# Patient Record
Sex: Female | Born: 1960 | Race: White | Hispanic: No | Marital: Single | Smoking: Never smoker
Health system: Southern US, Community
[De-identification: ages and names within clinical notes are randomized; demographics above are authoritative.]

## PROBLEM LIST (undated history)

## (undated) DIAGNOSIS — R609 Edema, unspecified: Secondary | ICD-10-CM

## (undated) DIAGNOSIS — E785 Hyperlipidemia, unspecified: Secondary | ICD-10-CM

## (undated) DIAGNOSIS — T8859XA Other complications of anesthesia, initial encounter: Secondary | ICD-10-CM

## (undated) DIAGNOSIS — E119 Type 2 diabetes mellitus without complications: Secondary | ICD-10-CM

## (undated) DIAGNOSIS — F419 Anxiety disorder, unspecified: Secondary | ICD-10-CM

## (undated) DIAGNOSIS — E039 Hypothyroidism, unspecified: Secondary | ICD-10-CM

## (undated) DIAGNOSIS — T4145XA Adverse effect of unspecified anesthetic, initial encounter: Secondary | ICD-10-CM

## (undated) DIAGNOSIS — R232 Flushing: Secondary | ICD-10-CM

## (undated) DIAGNOSIS — I8390 Asymptomatic varicose veins of unspecified lower extremity: Secondary | ICD-10-CM

## (undated) DIAGNOSIS — E079 Disorder of thyroid, unspecified: Secondary | ICD-10-CM

## (undated) DIAGNOSIS — F32A Depression, unspecified: Secondary | ICD-10-CM

## (undated) DIAGNOSIS — M1990 Unspecified osteoarthritis, unspecified site: Secondary | ICD-10-CM

## (undated) DIAGNOSIS — C50919 Malignant neoplasm of unspecified site of unspecified female breast: Secondary | ICD-10-CM

## (undated) DIAGNOSIS — F329 Major depressive disorder, single episode, unspecified: Secondary | ICD-10-CM

## (undated) HISTORY — PX: CARPAL TUNNEL RELEASE: SHX101

## (undated) HISTORY — DX: Disorder of thyroid, unspecified: E07.9

## (undated) HISTORY — PX: VEIN SURGERY: SHX48

## (undated) HISTORY — DX: Type 2 diabetes mellitus without complications: E11.9

## (undated) HISTORY — DX: Hypothyroidism, unspecified: E03.9

## (undated) HISTORY — DX: Flushing: R23.2

## (undated) HISTORY — DX: Asymptomatic varicose veins of unspecified lower extremity: I83.90

## (undated) HISTORY — DX: Malignant neoplasm of unspecified site of unspecified female breast: C50.919

## (undated) HISTORY — PX: AXILLARY NODE DISSECTION: SHX1211

---

## 1989-01-08 HISTORY — PX: BREAST REDUCTION SURGERY: SHX8

## 1998-04-06 HISTORY — PX: CARPAL TUNNEL RELEASE: SHX101

## 1999-01-09 HISTORY — PX: ABDOMINAL HYSTERECTOMY: SHX81

## 2000-08-05 ENCOUNTER — Ambulatory Visit (HOSPITAL_COMMUNITY): Admission: RE | Admit: 2000-08-05 | Discharge: 2000-08-05 | Payer: Self-pay | Admitting: Family Medicine

## 2000-08-05 ENCOUNTER — Encounter: Payer: Self-pay | Admitting: Family Medicine

## 2001-01-08 HISTORY — PX: DEBRIDEMENT TENNIS ELBOW: SHX1442

## 2001-04-29 ENCOUNTER — Encounter: Admission: RE | Admit: 2001-04-29 | Discharge: 2001-07-28 | Payer: Self-pay | Admitting: Internal Medicine

## 2001-07-15 ENCOUNTER — Encounter (HOSPITAL_COMMUNITY): Admission: RE | Admit: 2001-07-15 | Discharge: 2001-08-15 | Payer: Self-pay | Admitting: Orthopaedic Surgery

## 2001-08-21 ENCOUNTER — Encounter: Admission: RE | Admit: 2001-08-21 | Discharge: 2001-10-21 | Payer: Self-pay | Admitting: Internal Medicine

## 2002-07-23 ENCOUNTER — Ambulatory Visit (HOSPITAL_COMMUNITY): Admission: RE | Admit: 2002-07-23 | Discharge: 2002-07-23 | Payer: Self-pay | Admitting: Obstetrics & Gynecology

## 2002-07-23 ENCOUNTER — Encounter: Payer: Self-pay | Admitting: Obstetrics & Gynecology

## 2003-02-22 ENCOUNTER — Ambulatory Visit (HOSPITAL_COMMUNITY): Admission: RE | Admit: 2003-02-22 | Discharge: 2003-02-22 | Payer: Self-pay | Admitting: Family Medicine

## 2003-07-26 ENCOUNTER — Ambulatory Visit (HOSPITAL_COMMUNITY): Admission: RE | Admit: 2003-07-26 | Discharge: 2003-07-26 | Payer: Self-pay | Admitting: Obstetrics & Gynecology

## 2003-10-23 ENCOUNTER — Ambulatory Visit (HOSPITAL_BASED_OUTPATIENT_CLINIC_OR_DEPARTMENT_OTHER): Admission: RE | Admit: 2003-10-23 | Discharge: 2003-10-23 | Payer: Self-pay | Admitting: Internal Medicine

## 2003-12-08 ENCOUNTER — Emergency Department (HOSPITAL_COMMUNITY): Admission: EM | Admit: 2003-12-08 | Discharge: 2003-12-08 | Payer: Self-pay | Admitting: Emergency Medicine

## 2004-08-01 ENCOUNTER — Ambulatory Visit (HOSPITAL_COMMUNITY): Admission: RE | Admit: 2004-08-01 | Discharge: 2004-08-01 | Payer: Self-pay | Admitting: Obstetrics & Gynecology

## 2005-08-03 ENCOUNTER — Ambulatory Visit (HOSPITAL_COMMUNITY): Admission: RE | Admit: 2005-08-03 | Discharge: 2005-08-03 | Payer: Self-pay | Admitting: Obstetrics & Gynecology

## 2006-02-15 ENCOUNTER — Emergency Department (HOSPITAL_COMMUNITY): Admission: EM | Admit: 2006-02-15 | Discharge: 2006-02-15 | Payer: Self-pay | Admitting: Emergency Medicine

## 2006-08-05 ENCOUNTER — Ambulatory Visit (HOSPITAL_COMMUNITY): Admission: RE | Admit: 2006-08-05 | Discharge: 2006-08-05 | Payer: Self-pay | Admitting: Obstetrics & Gynecology

## 2006-11-12 ENCOUNTER — Encounter (HOSPITAL_COMMUNITY): Admission: RE | Admit: 2006-11-12 | Discharge: 2006-12-12 | Payer: Self-pay | Admitting: Orthopaedic Surgery

## 2007-01-15 HISTORY — PX: CERVICAL FUSION: SHX112

## 2007-08-07 ENCOUNTER — Ambulatory Visit (HOSPITAL_COMMUNITY): Admission: RE | Admit: 2007-08-07 | Discharge: 2007-08-07 | Payer: Self-pay | Admitting: Family Medicine

## 2008-08-09 ENCOUNTER — Ambulatory Visit (HOSPITAL_COMMUNITY): Admission: RE | Admit: 2008-08-09 | Discharge: 2008-08-09 | Payer: Self-pay | Admitting: Obstetrics and Gynecology

## 2008-08-13 ENCOUNTER — Encounter: Admission: RE | Admit: 2008-08-13 | Discharge: 2008-08-13 | Payer: Self-pay | Admitting: Internal Medicine

## 2008-11-12 ENCOUNTER — Encounter: Admission: RE | Admit: 2008-11-12 | Discharge: 2008-11-12 | Payer: Self-pay | Admitting: Internal Medicine

## 2009-01-08 DIAGNOSIS — C50919 Malignant neoplasm of unspecified site of unspecified female breast: Secondary | ICD-10-CM

## 2009-01-08 HISTORY — DX: Malignant neoplasm of unspecified site of unspecified female breast: C50.919

## 2009-01-25 ENCOUNTER — Ambulatory Visit (HOSPITAL_BASED_OUTPATIENT_CLINIC_OR_DEPARTMENT_OTHER): Admission: RE | Admit: 2009-01-25 | Discharge: 2009-01-25 | Payer: Self-pay | Admitting: General Surgery

## 2009-01-25 HISTORY — PX: OTHER SURGICAL HISTORY: SHX169

## 2009-02-10 ENCOUNTER — Encounter: Admission: RE | Admit: 2009-02-10 | Discharge: 2009-02-10 | Payer: Self-pay | Admitting: General Surgery

## 2009-02-14 ENCOUNTER — Encounter: Admission: RE | Admit: 2009-02-14 | Discharge: 2009-02-14 | Payer: Self-pay | Admitting: General Surgery

## 2009-02-18 ENCOUNTER — Encounter: Admission: RE | Admit: 2009-02-18 | Discharge: 2009-02-18 | Payer: Self-pay | Admitting: General Surgery

## 2009-02-23 ENCOUNTER — Ambulatory Visit (HOSPITAL_BASED_OUTPATIENT_CLINIC_OR_DEPARTMENT_OTHER): Admission: RE | Admit: 2009-02-23 | Discharge: 2009-02-24 | Payer: Self-pay | Admitting: General Surgery

## 2009-03-15 ENCOUNTER — Encounter: Admission: RE | Admit: 2009-03-15 | Discharge: 2009-03-15 | Payer: Self-pay | Admitting: General Surgery

## 2009-03-16 ENCOUNTER — Ambulatory Visit (HOSPITAL_COMMUNITY): Admission: RE | Admit: 2009-03-16 | Discharge: 2009-03-16 | Payer: Self-pay | Admitting: General Surgery

## 2009-04-05 ENCOUNTER — Encounter (HOSPITAL_COMMUNITY): Admission: RE | Admit: 2009-04-05 | Discharge: 2009-05-05 | Payer: Self-pay | Admitting: Oncology

## 2009-04-05 ENCOUNTER — Ambulatory Visit (HOSPITAL_COMMUNITY): Payer: Self-pay | Admitting: Oncology

## 2009-04-06 ENCOUNTER — Encounter (HOSPITAL_COMMUNITY): Payer: Self-pay | Admitting: Oncology

## 2009-04-06 ENCOUNTER — Ambulatory Visit: Payer: Self-pay | Admitting: Cardiology

## 2009-04-08 ENCOUNTER — Encounter: Admission: RE | Admit: 2009-04-08 | Discharge: 2009-04-08 | Payer: Self-pay | Admitting: Orthopaedic Surgery

## 2009-04-19 ENCOUNTER — Observation Stay (HOSPITAL_COMMUNITY): Admission: RE | Admit: 2009-04-19 | Discharge: 2009-04-20 | Payer: Self-pay | Admitting: Obstetrics and Gynecology

## 2009-04-19 ENCOUNTER — Encounter: Payer: Self-pay | Admitting: Obstetrics and Gynecology

## 2009-04-19 HISTORY — PX: BILATERAL OOPHORECTOMY: SHX1221

## 2009-05-02 ENCOUNTER — Ambulatory Visit (HOSPITAL_BASED_OUTPATIENT_CLINIC_OR_DEPARTMENT_OTHER): Admission: RE | Admit: 2009-05-02 | Discharge: 2009-05-02 | Payer: Self-pay | Admitting: General Surgery

## 2009-05-02 HISTORY — PX: PORTACATH PLACEMENT: SHX2246

## 2009-05-10 ENCOUNTER — Encounter (HOSPITAL_COMMUNITY): Admission: RE | Admit: 2009-05-10 | Discharge: 2009-06-09 | Payer: Self-pay | Admitting: Oncology

## 2009-05-24 ENCOUNTER — Ambulatory Visit (HOSPITAL_COMMUNITY): Payer: Self-pay | Admitting: Oncology

## 2009-06-09 ENCOUNTER — Encounter (HOSPITAL_COMMUNITY): Admission: RE | Admit: 2009-06-09 | Discharge: 2009-07-09 | Payer: Self-pay | Admitting: Oncology

## 2009-07-13 ENCOUNTER — Ambulatory Visit (HOSPITAL_COMMUNITY): Payer: Self-pay | Admitting: Oncology

## 2009-07-20 ENCOUNTER — Encounter (HOSPITAL_COMMUNITY): Admission: RE | Admit: 2009-07-20 | Discharge: 2009-08-19 | Payer: Self-pay | Admitting: Oncology

## 2009-07-22 ENCOUNTER — Encounter: Admission: RE | Admit: 2009-07-22 | Discharge: 2009-07-22 | Payer: Self-pay | Admitting: Internal Medicine

## 2009-07-25 ENCOUNTER — Ambulatory Visit (HOSPITAL_COMMUNITY): Admission: RE | Admit: 2009-07-25 | Discharge: 2009-07-25 | Payer: Self-pay | Admitting: Urology

## 2009-08-23 ENCOUNTER — Encounter (HOSPITAL_COMMUNITY): Admission: RE | Admit: 2009-08-23 | Discharge: 2009-09-22 | Payer: Self-pay | Admitting: Oncology

## 2009-08-31 ENCOUNTER — Ambulatory Visit (HOSPITAL_COMMUNITY): Payer: Self-pay | Admitting: Oncology

## 2009-08-31 ENCOUNTER — Ambulatory Visit (HOSPITAL_COMMUNITY): Admission: RE | Admit: 2009-08-31 | Discharge: 2009-08-31 | Payer: Self-pay | Admitting: Oncology

## 2009-10-04 ENCOUNTER — Ambulatory Visit
Admission: RE | Admit: 2009-10-04 | Discharge: 2009-12-26 | Payer: Self-pay | Source: Home / Self Care | Attending: Radiation Oncology | Admitting: Radiation Oncology

## 2009-10-05 ENCOUNTER — Encounter (HOSPITAL_COMMUNITY)
Admission: RE | Admit: 2009-10-05 | Discharge: 2009-11-04 | Payer: Self-pay | Source: Home / Self Care | Admitting: Oncology

## 2009-11-21 ENCOUNTER — Encounter (HOSPITAL_COMMUNITY)
Admission: RE | Admit: 2009-11-21 | Discharge: 2009-12-21 | Payer: Self-pay | Source: Home / Self Care | Attending: Oncology | Admitting: Oncology

## 2009-11-21 ENCOUNTER — Ambulatory Visit (HOSPITAL_COMMUNITY): Payer: Self-pay | Admitting: Oncology

## 2010-01-08 HISTORY — PX: RECTOCELE REPAIR: SHX761

## 2010-01-30 ENCOUNTER — Encounter: Payer: Self-pay | Admitting: Obstetrics and Gynecology

## 2010-02-01 HISTORY — PX: OTHER SURGICAL HISTORY: SHX169

## 2010-02-09 ENCOUNTER — Encounter (HOSPITAL_COMMUNITY): Admission: RE | Admit: 2010-02-09 | Payer: Self-pay | Source: Home / Self Care | Admitting: Oncology

## 2010-02-09 ENCOUNTER — Other Ambulatory Visit (HOSPITAL_COMMUNITY): Payer: BC Managed Care – PPO

## 2010-02-09 ENCOUNTER — Ambulatory Visit (HOSPITAL_COMMUNITY): Admission: RE | Admit: 2010-02-09 | Payer: Self-pay | Source: Ambulatory Visit | Admitting: Oncology

## 2010-02-09 DIAGNOSIS — Z452 Encounter for adjustment and management of vascular access device: Secondary | ICD-10-CM

## 2010-02-09 DIAGNOSIS — C50919 Malignant neoplasm of unspecified site of unspecified female breast: Secondary | ICD-10-CM

## 2010-02-15 ENCOUNTER — Other Ambulatory Visit: Payer: Self-pay | Admitting: Adult Health

## 2010-02-15 DIAGNOSIS — Z01419 Encounter for gynecological examination (general) (routine) without abnormal findings: Secondary | ICD-10-CM | POA: Insufficient documentation

## 2010-02-20 ENCOUNTER — Ambulatory Visit (HOSPITAL_COMMUNITY): Payer: BC Managed Care – PPO | Admitting: Oncology

## 2010-02-20 DIAGNOSIS — C50919 Malignant neoplasm of unspecified site of unspecified female breast: Secondary | ICD-10-CM

## 2010-02-21 ENCOUNTER — Other Ambulatory Visit (HOSPITAL_COMMUNITY): Payer: BC Managed Care – PPO

## 2010-02-21 ENCOUNTER — Encounter (HOSPITAL_COMMUNITY): Payer: BC Managed Care – PPO | Attending: Oncology

## 2010-02-21 DIAGNOSIS — E119 Type 2 diabetes mellitus without complications: Secondary | ICD-10-CM | POA: Insufficient documentation

## 2010-02-21 DIAGNOSIS — C50919 Malignant neoplasm of unspecified site of unspecified female breast: Secondary | ICD-10-CM | POA: Insufficient documentation

## 2010-02-21 DIAGNOSIS — E039 Hypothyroidism, unspecified: Secondary | ICD-10-CM | POA: Insufficient documentation

## 2010-02-21 DIAGNOSIS — Z79899 Other long term (current) drug therapy: Secondary | ICD-10-CM | POA: Insufficient documentation

## 2010-03-01 ENCOUNTER — Other Ambulatory Visit (HOSPITAL_COMMUNITY)
Admission: RE | Admit: 2010-03-01 | Discharge: 2010-03-01 | Disposition: A | Discharge status: Home / Self Care | Payer: BC Managed Care – PPO | Source: Ambulatory Visit | Attending: Obstetrics and Gynecology | Admitting: Obstetrics and Gynecology

## 2010-03-03 ENCOUNTER — Other Ambulatory Visit: Payer: Self-pay | Admitting: Obstetrics and Gynecology

## 2010-03-03 ENCOUNTER — Encounter (HOSPITAL_COMMUNITY): Payer: BC Managed Care – PPO

## 2010-03-03 DIAGNOSIS — Z01812 Encounter for preprocedural laboratory examination: Secondary | ICD-10-CM | POA: Insufficient documentation

## 2010-03-07 ENCOUNTER — Ambulatory Visit (HOSPITAL_COMMUNITY)
Admission: RE | Admit: 2010-03-07 | Discharge: 2010-03-08 | Disposition: A | Discharge status: Home / Self Care | Payer: BC Managed Care – PPO | Source: Ambulatory Visit | Attending: Obstetrics and Gynecology | Admitting: Obstetrics and Gynecology

## 2010-03-07 DIAGNOSIS — Z01818 Encounter for other preprocedural examination: Secondary | ICD-10-CM | POA: Insufficient documentation

## 2010-03-07 DIAGNOSIS — Z853 Personal history of malignant neoplasm of breast: Secondary | ICD-10-CM | POA: Insufficient documentation

## 2010-03-07 DIAGNOSIS — Z79899 Other long term (current) drug therapy: Secondary | ICD-10-CM | POA: Insufficient documentation

## 2010-03-07 DIAGNOSIS — N815 Vaginal enterocele: Secondary | ICD-10-CM | POA: Insufficient documentation

## 2010-03-07 DIAGNOSIS — E039 Hypothyroidism, unspecified: Secondary | ICD-10-CM | POA: Insufficient documentation

## 2010-03-07 DIAGNOSIS — N816 Rectocele: Secondary | ICD-10-CM | POA: Insufficient documentation

## 2010-03-07 DIAGNOSIS — E119 Type 2 diabetes mellitus without complications: Secondary | ICD-10-CM | POA: Insufficient documentation

## 2010-03-07 DIAGNOSIS — Z01812 Encounter for preprocedural laboratory examination: Secondary | ICD-10-CM | POA: Insufficient documentation

## 2010-03-07 HISTORY — PX: OTHER SURGICAL HISTORY: SHX169

## 2010-03-07 LAB — GLUCOSE, CAPILLARY

## 2010-03-08 LAB — DIFFERENTIAL
Eosinophils Relative: 4 % (ref 0–5)
Lymphocytes Relative: 18 % (ref 12–46)
Monocytes Absolute: 0.6 10*3/uL (ref 0.1–1.0)
Monocytes Relative: 7 % (ref 3–12)
Neutro Abs: 5.8 10*3/uL (ref 1.7–7.7)

## 2010-03-08 LAB — CBC
HCT: 35.7 % — ABNORMAL LOW (ref 36.0–46.0)
Hemoglobin: 11.5 g/dL — ABNORMAL LOW (ref 12.0–15.0)
MCH: 26.3 pg (ref 26.0–34.0)
MCHC: 32.2 g/dL (ref 30.0–36.0)
MCV: 81.5 fL (ref 78.0–100.0)
RDW: 14.4 % (ref 11.5–15.5)

## 2010-03-09 NOTE — Op Note (Signed)
NAME:  Kelly Jacobson, Kelly Jacobson NO.:  [id number]  MEDICAL RECORD NO.:  [id number]           PATIENT TYPE:  O  LOCATION:  A207                          FACILITY:  [facility]  PHYSICIAN:  Tilda Burrow, M.D. DATE OF BIRTH:  1960-05-29  DATE OF PROCEDURE:  03/07/2010 DATE OF DISCHARGE:                              OPERATIVE REPORT   PREOPERATIVE DIAGNOSIS:  Rectocele.  POSTOPERATIVE DIAGNOSES:             1. Rectocele.          2. Enterocele.  PROCEDURE:  Rectocele and enterocele repair.  SURGEON:  Tilda Burrow, MD ASSISTANTMarlinda Mike, RN ANESTHESIA:  General with LMA.  COMPLICATIONS:  None.  FINDINGS:  Site specific defect with a disruption of the pelvic support at the vaginal apex which responded dramatically to identifying lateral support and the inferior edges of the defect.  DETAILS OF PROCEDURE:  The patient was taken to the operating room and prepped and draped for vaginal procedure with legs in candy-canesupport.  Time-out was conducted and procedure confirmed by all.  IV antibiotics Ancef 1 g was administered.  The posterior vaginal mucosa was infiltrated with Marcaine solution with epinephrine x20 mL and then the perineal body opened in the midline at the site of some fibrosis at the introitus from prior episiotomy.  Above the fibrosis was a large bulging sac which upon dissection appeared to be both enterocele and rectocele.  There was a transverse layer of firm tissue in the lower half of the defect and a very large and very thin membranous defect above it.  This was determined to be an enterocele but doing a digital rectal exam confirming that the sac was in front of the rectal wall laxity.  It was noted that by grasping the transverse thicker tissue that it was located approximately halfway up the defect,  We were able to lift the stronger supportive tissue cephalad and by doing so obliterated the enterocele.  The lateral tissue support could  be identified on each side and this was grasped with Allis clamps, pulled together in the midline, and sutured together, incorporating the posterior wall of supportive tissue and pulling it cephalad.  This resulted in dramatic improvement in the posterior perineal body tissue support.  A series of vertical mattress sutures of 0 Vicryl were placed inferior to this initial stitch and resulted in a good layer of thickened tissue in the perineal body.  The result was a thickened perineal body.  The introitus was recontoured to reduce introitus size to approximately 2 fingerbreadths wide.  This was also pulled together using interrupted 0 Vicryl sutures.  The vaginal mucosa required only a small bit of trimming but was then pulled together in the midline with interrupted 2-0 chromic.  The vaginal packing was placed using Betadine-soaked gauze and Foley catheter was inserted, obtaining clear urine.  Sponge and needle counts were correct.  The patient went to recovery room in good condition, will be observed from 4- 6 hours and then discharged home.     Tilda Burrow, M.D.     JVF/MEDQ  D:  03/07/2010  T:  03/08/2010  Job:  [id number]  Electronically Signed by Christin Bach M.D. on 03/09/2010 08:45:48 PM

## 2010-03-23 ENCOUNTER — Other Ambulatory Visit (HOSPITAL_COMMUNITY): Payer: BC Managed Care – PPO

## 2010-03-23 ENCOUNTER — Encounter (HOSPITAL_COMMUNITY): Payer: BC Managed Care – PPO | Attending: Oncology

## 2010-03-23 DIAGNOSIS — C50919 Malignant neoplasm of unspecified site of unspecified female breast: Secondary | ICD-10-CM

## 2010-03-23 LAB — COMPREHENSIVE METABOLIC PANEL
ALT: 83 U/L — ABNORMAL HIGH (ref 0–35)
Alkaline Phosphatase: 47 U/L (ref 39–117)
BUN: 10 mg/dL (ref 6–23)
BUN: 10 mg/dL (ref 6–23)
CO2: 28 mEq/L (ref 19–32)
CO2: 34 mEq/L — ABNORMAL HIGH (ref 19–32)
Calcium: 9.9 mg/dL (ref 8.4–10.5)
Chloride: 102 mEq/L (ref 96–112)
GFR calc non Af Amer: >60 mL/min (ref >60)
Glucose, Bld: 159 mg/dL — ABNORMAL HIGH (ref 70–99)
Glucose, Bld: 272 mg/dL — ABNORMAL HIGH (ref 70–99)
Potassium: 3.5 mEq/L (ref 3.5–5.1)
Sodium: 138 mEq/L (ref 135–145)
Total Bilirubin: 0.6 mg/dL (ref 0.3–1.2)

## 2010-03-23 LAB — CBC
HCT: 29.6 % — ABNORMAL LOW (ref 36.0–46.0)
HCT: 33.4 % — ABNORMAL LOW (ref 36.0–46.0)
Hemoglobin: 10.9 g/dL — ABNORMAL LOW (ref 12.0–15.0)
Hemoglobin: 11.3 g/dL — ABNORMAL LOW (ref 12.0–15.0)
Hemoglobin: 11.4 g/dL — ABNORMAL LOW (ref 12.0–15.0)
Hemoglobin: 12.1 g/dL (ref 12.0–15.0)
MCH: 31 pg (ref 26.0–34.0)
MCH: 31.4 pg (ref 26.0–34.0)
MCHC: 33.9 g/dL (ref 30.0–36.0)
MCHC: 34.1 g/dL (ref 30.0–36.0)
MCV: 91.1 fL (ref 78.0–100.0)
MCV: 92.1 fL (ref 78.0–100.0)
MCV: 92.6 fL (ref 78.0–100.0)
Platelets: 290 10*3/uL (ref 150–400)
RBC: 3.25 MIL/uL — ABNORMAL LOW (ref 3.87–5.11)
RBC: 3.49 MIL/uL — ABNORMAL LOW (ref 3.87–5.11)
RBC: 3.64 MIL/uL — ABNORMAL LOW (ref 3.87–5.11)
RBC: 3.88 MIL/uL (ref 3.87–5.11)
WBC: 5.5 10*3/uL (ref 4.0–10.5)
WBC: 7 10*3/uL (ref 4.0–10.5)
WBC: 7.7 10*3/uL (ref 4.0–10.5)

## 2010-03-23 LAB — DIFFERENTIAL
Basophils Absolute: 0 10*3/uL (ref 0.0–0.1)
Basophils Absolute: 0 10*3/uL (ref 0.0–0.1)
Basophils Relative: 0 % (ref 0–1)
Basophils Relative: 0 % (ref 0–1)
Basophils Relative: 1 % (ref 0–1)
Eosinophils Absolute: 0.2 10*3/uL (ref 0.0–0.7)
Lymphocytes Relative: 18 % (ref 12–46)
Lymphocytes Relative: 22 % (ref 12–46)
Lymphs Abs: 1 10*3/uL (ref 0.7–4.0)
Lymphs Abs: 1.4 10*3/uL (ref 0.7–4.0)
Lymphs Abs: 1.5 10*3/uL (ref 0.7–4.0)
Monocytes Absolute: 0.2 10*3/uL (ref 0.1–1.0)
Monocytes Relative: 5 % (ref 3–12)
Monocytes Relative: 5 % (ref 3–12)
Neutro Abs: 4.1 10*3/uL (ref 1.7–7.7)
Neutro Abs: 4.6 10*3/uL (ref 1.7–7.7)
Neutro Abs: 5.8 10*3/uL (ref 1.7–7.7)
Neutro Abs: 5.8 10*3/uL (ref 1.7–7.7)
Neutrophils Relative %: 71 % (ref 43–77)
Neutrophils Relative %: 75 % (ref 43–77)
Neutrophils Relative %: 75 % (ref 43–77)
Neutrophils Relative %: 76 % (ref 43–77)
Neutrophils Relative %: 76 % (ref 43–77)

## 2010-03-24 LAB — CBC
HCT: 29.9 % — ABNORMAL LOW (ref 36.0–46.0)
HCT: 34 % — ABNORMAL LOW (ref 36.0–46.0)
Hemoglobin: 10.2 g/dL — ABNORMAL LOW (ref 12.0–15.0)
MCH: 30.1 pg (ref 26.0–34.0)
MCH: 30.1 pg (ref 26.0–34.0)
MCV: 87.9 fL (ref 78.0–100.0)
MCV: 90.4 fL (ref 78.0–100.0)
Platelets: 311 10*3/uL (ref 150–400)
RBC: 3.4 MIL/uL — ABNORMAL LOW (ref 3.87–5.11)
RDW: 20.3 % — ABNORMAL HIGH (ref 11.5–15.5)

## 2010-03-24 LAB — COMPREHENSIVE METABOLIC PANEL
Albumin: 4.2 g/dL (ref 3.5–5.2)
Alkaline Phosphatase: 56 U/L (ref 39–117)
BUN: 8 mg/dL (ref 6–23)
Chloride: 100 mEq/L (ref 96–112)
Creatinine, Ser: 0.72 mg/dL (ref 0.4–1.2)
Glucose, Bld: 237 mg/dL — ABNORMAL HIGH (ref 70–99)
Total Bilirubin: 0.9 mg/dL (ref 0.3–1.2)
Total Protein: 7.1 g/dL (ref 6.0–8.3)

## 2010-03-24 LAB — DIFFERENTIAL
Basophils Absolute: 0 10*3/uL (ref 0.0–0.1)
Basophils Relative: 0 % (ref 0–1)
Eosinophils Absolute: 0.1 10*3/uL (ref 0.0–0.7)
Eosinophils Relative: 3 % (ref 0–5)
Lymphocytes Relative: 17 % (ref 12–46)
Lymphocytes Relative: 18 % (ref 12–46)
Lymphs Abs: 1 10*3/uL (ref 0.7–4.0)
Monocytes Absolute: 0.3 10*3/uL (ref 0.1–1.0)
Monocytes Relative: 3 % (ref 3–12)
Neutro Abs: 4.7 10*3/uL (ref 1.7–7.7)
Neutrophils Relative %: 75 % (ref 43–77)

## 2010-03-25 LAB — DIFFERENTIAL
Basophils Relative: 0 % (ref 0–1)
Eosinophils Absolute: 0.2 10*3/uL (ref 0.0–0.7)
Lymphocytes Relative: 18 % (ref 12–46)
Lymphs Abs: 1.1 10*3/uL (ref 0.7–4.0)
Lymphs Abs: 1.1 10*3/uL (ref 0.7–4.0)
Monocytes Absolute: 0.2 10*3/uL (ref 0.1–1.0)
Monocytes Absolute: 0.3 10*3/uL (ref 0.1–1.0)
Monocytes Relative: 4 % (ref 3–12)
Monocytes Relative: 5 % (ref 3–12)
Neutro Abs: 4.5 10*3/uL (ref 1.7–7.7)
Neutrophils Relative %: 69 % (ref 43–77)
Neutrophils Relative %: 75 % (ref 43–77)

## 2010-03-25 LAB — CBC
HCT: 29.9 % — ABNORMAL LOW (ref 36.0–46.0)
HCT: 32.5 % — ABNORMAL LOW (ref 36.0–46.0)
Hemoglobin: 10.3 g/dL — ABNORMAL LOW (ref 12.0–15.0)
Hemoglobin: 11 g/dL — ABNORMAL LOW (ref 12.0–15.0)
MCH: 30 pg (ref 26.0–34.0)
MCHC: 33.8 g/dL (ref 30.0–36.0)
MCHC: 34.3 g/dL (ref 30.0–36.0)
MCV: 87.4 fL (ref 78.0–100.0)
RBC: 3.42 MIL/uL — ABNORMAL LOW (ref 3.87–5.11)
RBC: 3.76 MIL/uL — ABNORMAL LOW (ref 3.87–5.11)

## 2010-03-25 LAB — TSH: TSH: 3.325 u[IU]/mL (ref 0.350–4.500)

## 2010-03-26 LAB — COMPREHENSIVE METABOLIC PANEL
ALT: 46 U/L — ABNORMAL HIGH (ref 0–35)
Alkaline Phosphatase: 48 U/L (ref 39–117)
BUN: 8 mg/dL (ref 6–23)
CO2: 30 mEq/L (ref 19–32)
CO2: 32 mEq/L (ref 19–32)
Calcium: 9.3 mg/dL (ref 8.4–10.5)
Calcium: 8.6 mg/dL (ref 8.4–10.5)
Creatinine, Ser: 0.57 mg/dL (ref 0.4–1.2)
GFR calc non Af Amer: >60 mL/min (ref >60)
GFR calc non Af Amer: >60 mL/min (ref >60)
Glucose, Bld: 222 mg/dL — ABNORMAL HIGH (ref 70–99)
Glucose, Bld: 177 mg/dL — ABNORMAL HIGH (ref 70–99)
Sodium: 139 mEq/L (ref 135–145)
Total Protein: 6.3 g/dL (ref 6.0–8.3)

## 2010-03-26 LAB — CBC
HCT: 30 % — ABNORMAL LOW (ref 36.0–46.0)
HCT: 30.6 % — ABNORMAL LOW (ref 36.0–46.0)
HCT: 29 % — ABNORMAL LOW (ref 36.0–46.0)
HCT: 30.4 % — ABNORMAL LOW (ref 36.0–46.0)
Hemoglobin: 10.1 g/dL — ABNORMAL LOW (ref 12.0–15.0)
Hemoglobin: 10.4 g/dL — ABNORMAL LOW (ref 12.0–15.0)
Hemoglobin: 9.7 g/dL — ABNORMAL LOW (ref 12.0–15.0)
MCH: 29 pg (ref 26.0–34.0)
MCHC: 33.5 g/dL (ref 30.0–36.0)
MCHC: 34 g/dL (ref 30.0–36.0)
MCV: 85.3 fL (ref 78.0–100.0)
MCV: 84.4 fL (ref 78.0–100.0)
Platelets: 202 10*3/uL (ref 150–400)
RBC: 3.43 MIL/uL — ABNORMAL LOW (ref 3.87–5.11)
RDW: 18.2 % — ABNORMAL HIGH (ref 11.5–15.5)
RDW: 20 % — ABNORMAL HIGH (ref 11.5–15.5)
RDW: 17.4 % — ABNORMAL HIGH (ref 11.5–15.5)
WBC: 6.1 10*3/uL (ref 4.0–10.5)
WBC: 9.7 10*3/uL (ref 4.0–10.5)

## 2010-03-26 LAB — DIFFERENTIAL
Basophils Absolute: 0 10*3/uL (ref 0.0–0.1)
Basophils Relative: 0 % (ref 0–1)
Eosinophils Absolute: 0.2 10*3/uL (ref 0.0–0.7)
Eosinophils Relative: 3 % (ref 0–5)
Lymphocytes Relative: 14 % (ref 12–46)
Lymphocytes Relative: 17 % (ref 12–46)
Lymphocytes Relative: 19 % (ref 12–46)
Lymphs Abs: 1.1 10*3/uL (ref 0.7–4.0)
Lymphs Abs: 1.4 10*3/uL (ref 0.7–4.0)
Monocytes Absolute: 0.6 10*3/uL (ref 0.1–1.0)
Monocytes Absolute: 0.7 10*3/uL (ref 0.1–1.0)
Monocytes Relative: 5 % (ref 3–12)
Monocytes Relative: 7 % (ref 3–12)
Monocytes Relative: 7 % (ref 3–12)
Neutro Abs: 4.9 10*3/uL (ref 1.7–7.7)
Neutro Abs: 6.4 10*3/uL (ref 1.7–7.7)
Neutro Abs: 4.1 10*3/uL (ref 1.7–7.7)
Neutrophils Relative %: 75 % (ref 43–77)
Neutrophils Relative %: 76 % (ref 43–77)

## 2010-03-27 LAB — DIFFERENTIAL
Basophils Absolute: 0 10*3/uL (ref 0.0–0.1)
Basophils Absolute: 0 10*3/uL (ref 0.0–0.1)
Basophils Relative: 0 % (ref 0–1)
Lymphocytes Relative: 18 % (ref 12–46)
Monocytes Absolute: 0.8 10*3/uL (ref 0.1–1.0)
Monocytes Absolute: 0.5 10*3/uL (ref 0.1–1.0)
Neutro Abs: 8 10*3/uL — ABNORMAL HIGH (ref 1.7–7.7)
Neutro Abs: 8 10*3/uL — ABNORMAL HIGH (ref 1.7–7.7)
Neutrophils Relative %: 76 % (ref 43–77)
Neutrophils Relative %: 74 % (ref 43–77)

## 2010-03-27 LAB — CBC
HCT: 32.9 % — ABNORMAL LOW (ref 36.0–46.0)
Hemoglobin: 10.9 g/dL — ABNORMAL LOW (ref 12.0–15.0)
Hemoglobin: 11.6 g/dL — ABNORMAL LOW (ref 12.0–15.0)
RBC: 4.09 MIL/uL (ref 3.87–5.11)
RDW: 13.9 % (ref 11.5–15.5)
WBC: 10.5 10*3/uL (ref 4.0–10.5)

## 2010-03-27 LAB — COMPREHENSIVE METABOLIC PANEL
Alkaline Phosphatase: 64 U/L (ref 39–117)
BUN: 11 mg/dL (ref 6–23)
Chloride: 99 mEq/L (ref 96–112)
Glucose, Bld: 131 mg/dL — ABNORMAL HIGH (ref 70–99)
Potassium: 3.2 mEq/L — ABNORMAL LOW (ref 3.5–5.1)
Total Bilirubin: 0.9 mg/dL (ref 0.3–1.2)

## 2010-03-28 LAB — DIFFERENTIAL
Basophils Absolute: 0 10*3/uL (ref 0.0–0.1)
Basophils Relative: 0 % (ref 0–1)
Eosinophils Absolute: 0.8 10*3/uL — ABNORMAL HIGH (ref 0.0–0.7)
Neutro Abs: 6.4 10*3/uL (ref 1.7–7.7)
Neutrophils Relative %: 64 % (ref 43–77)

## 2010-03-28 LAB — POCT I-STAT, CHEM 8
Chloride: 101 mEq/L (ref 96–112)
HCT: 38 % (ref 36.0–46.0)
Hemoglobin: 12.9 g/dL (ref 12.0–15.0)
Potassium: 4.3 mEq/L (ref 3.5–5.1)

## 2010-03-28 LAB — COMPREHENSIVE METABOLIC PANEL
ALT: 75 U/L — ABNORMAL HIGH (ref 0–35)
Alkaline Phosphatase: 61 U/L (ref 39–117)
BUN: 12 mg/dL (ref 6–23)
CO2: 29 mEq/L (ref 19–32)
Chloride: 100 mEq/L (ref 96–112)
GFR calc non Af Amer: >60 mL/min (ref >60)
Glucose, Bld: 154 mg/dL — ABNORMAL HIGH (ref 70–99)
Potassium: 3.5 mEq/L (ref 3.5–5.1)
Total Bilirubin: 0.8 mg/dL (ref 0.3–1.2)

## 2010-03-28 LAB — CBC
HCT: 34.6 % — ABNORMAL LOW (ref 36.0–46.0)
Hemoglobin: 12 g/dL (ref 12.0–15.0)
RBC: 4.2 MIL/uL (ref 3.87–5.11)
RDW: 13.8 % (ref 11.5–15.5)
WBC: 10.1 10*3/uL (ref 4.0–10.5)

## 2010-03-28 LAB — GLUCOSE, CAPILLARY: Glucose-Capillary: 165 mg/dL — ABNORMAL HIGH (ref 70–99)

## 2010-03-29 LAB — BASIC METABOLIC PANEL
BUN: 5 mg/dL — ABNORMAL LOW (ref 6–23)
Chloride: 101 mEq/L (ref 96–112)
GFR calc non Af Amer: >60 mL/min (ref >60)
Glucose, Bld: 191 mg/dL — ABNORMAL HIGH (ref 70–99)
Potassium: 4.1 mEq/L (ref 3.5–5.1)
Sodium: 135 mEq/L (ref 135–145)

## 2010-03-29 LAB — CBC
HCT: 36.7 % (ref 36.0–46.0)
Hemoglobin: 12.2 g/dL (ref 12.0–15.0)
Hemoglobin: 12.5 g/dL (ref 12.0–15.0)
MCHC: 34 g/dL (ref 30.0–36.0)
MCV: 83 fL (ref 78.0–100.0)
Platelets: 277 10*3/uL (ref 150–400)
RBC: 4.3 MIL/uL (ref 3.87–5.11)
RDW: 13.9 % (ref 11.5–15.5)
RDW: 14.1 % (ref 11.5–15.5)
WBC: 12.6 10*3/uL — ABNORMAL HIGH (ref 4.0–10.5)

## 2010-03-29 LAB — GLUCOSE, CAPILLARY
Glucose-Capillary: 154 mg/dL — ABNORMAL HIGH (ref 70–99)
Glucose-Capillary: 214 mg/dL — ABNORMAL HIGH (ref 70–99)

## 2010-03-29 LAB — DIFFERENTIAL
Basophils Relative: 0 % (ref 0–1)
Eosinophils Absolute: 0.3 10*3/uL (ref 0.0–0.7)
Eosinophils Absolute: 0.4 10*3/uL (ref 0.0–0.7)
Eosinophils Relative: 3 % (ref 0–5)
Eosinophils Relative: 5 % (ref 0–5)
Lymphocytes Relative: 21 % (ref 12–46)
Lymphs Abs: 1.9 10*3/uL (ref 0.7–4.0)
Lymphs Abs: 2.7 10*3/uL (ref 0.7–4.0)
Monocytes Absolute: 0.6 10*3/uL (ref 0.1–1.0)
Monocytes Relative: 5 % (ref 3–12)
Neutrophils Relative %: 68 % (ref 43–77)

## 2010-03-29 LAB — COMPREHENSIVE METABOLIC PANEL
ALT: 72 U/L — ABNORMAL HIGH (ref 0–35)
AST: 95 U/L — ABNORMAL HIGH (ref 0–37)
Alkaline Phosphatase: 61 U/L (ref 39–117)
CO2: 31 mEq/L (ref 19–32)
Calcium: 9.6 mg/dL (ref 8.4–10.5)
GFR calc Af Amer: >60 mL/min (ref >60)
Glucose, Bld: 165 mg/dL — ABNORMAL HIGH (ref 70–99)
Potassium: 4.3 mEq/L (ref 3.5–5.1)
Sodium: 139 mEq/L (ref 135–145)
Total Protein: 6.6 g/dL (ref 6.0–8.3)

## 2010-03-29 LAB — HEMOGLOBIN A1C: Hgb A1c MFr Bld: 7.4 % — ABNORMAL HIGH (ref <5.7)

## 2010-03-30 LAB — BASIC METABOLIC PANEL
Calcium: 9.4 mg/dL (ref 8.4–10.5)
GFR calc Af Amer: >60 mL/min (ref >60)
GFR calc non Af Amer: >60 mL/min (ref >60)
Potassium: 3.8 mEq/L (ref 3.5–5.1)
Sodium: 139 mEq/L (ref 135–145)

## 2010-03-30 LAB — DIFFERENTIAL
Basophils Absolute: 0 10*3/uL (ref 0.0–0.1)
Eosinophils Relative: 2 % (ref 0–5)
Lymphocytes Relative: 18 % (ref 12–46)
Lymphs Abs: 2 10*3/uL (ref 0.7–4.0)
Monocytes Absolute: 0.4 10*3/uL (ref 0.1–1.0)
Monocytes Relative: 4 % (ref 3–12)
Neutro Abs: 8.5 10*3/uL — ABNORMAL HIGH (ref 1.7–7.7)

## 2010-03-30 LAB — APTT: aPTT: 30 seconds (ref 24–37)

## 2010-03-30 LAB — CBC
HCT: 37 % (ref 36.0–46.0)
Hemoglobin: 12.4 g/dL (ref 12.0–15.0)
RBC: 4.32 MIL/uL (ref 3.87–5.11)
WBC: 11.2 10*3/uL — ABNORMAL HIGH (ref 4.0–10.5)

## 2010-03-30 LAB — GLUCOSE, CAPILLARY
Glucose-Capillary: 176 mg/dL — ABNORMAL HIGH (ref 70–99)
Glucose-Capillary: 196 mg/dL — ABNORMAL HIGH (ref 70–99)

## 2010-04-02 LAB — CANCER ANTIGEN 27.29: CA 27.29: 6 U/mL (ref 0–39)

## 2010-04-02 LAB — DIFFERENTIAL
Eosinophils Absolute: 0.4 10*3/uL (ref 0.0–0.7)
Eosinophils Relative: 3 % (ref 0–5)
Lymphocytes Relative: 17 % (ref 12–46)
Lymphs Abs: 1.9 10*3/uL (ref 0.7–4.0)
Monocytes Absolute: 0.5 10*3/uL (ref 0.1–1.0)

## 2010-04-02 LAB — COMPREHENSIVE METABOLIC PANEL
ALT: 40 U/L — ABNORMAL HIGH (ref 0–35)
AST: 53 U/L — ABNORMAL HIGH (ref 0–37)
Albumin: 3.7 g/dL (ref 3.5–5.2)
Chloride: 101 mEq/L (ref 96–112)
Creatinine, Ser: 0.47 mg/dL (ref 0.4–1.2)
GFR calc Af Amer: >60 mL/min (ref >60)
Sodium: 137 mEq/L (ref 135–145)
Total Bilirubin: 0.8 mg/dL (ref 0.3–1.2)

## 2010-04-02 LAB — CBC
MCV: 82.5 fL (ref 78.0–100.0)
Platelets: 244 10*3/uL (ref 150–400)
WBC: 11.2 10*3/uL — ABNORMAL HIGH (ref 4.0–10.5)

## 2010-04-03 LAB — COMPREHENSIVE METABOLIC PANEL
Albumin: 3.8 g/dL (ref 3.5–5.2)
BUN: 7 mg/dL (ref 6–23)
Calcium: 9.5 mg/dL (ref 8.4–10.5)
Creatinine, Ser: 0.67 mg/dL (ref 0.4–1.2)
Total Protein: 7.2 g/dL (ref 6.0–8.3)

## 2010-04-03 LAB — CBC
HCT: 38.8 % (ref 36.0–46.0)
MCV: 85.4 fL (ref 78.0–100.0)
Platelets: 258 10*3/uL (ref 150–400)
RDW: 13.9 % (ref 11.5–15.5)

## 2010-04-03 LAB — GLUCOSE, CAPILLARY
Glucose-Capillary: 167 mg/dL — ABNORMAL HIGH (ref 70–99)
Glucose-Capillary: 183 mg/dL — ABNORMAL HIGH (ref 70–99)

## 2010-05-03 ENCOUNTER — Encounter (HOSPITAL_COMMUNITY): Payer: BC Managed Care – PPO | Attending: Oncology

## 2010-05-03 DIAGNOSIS — C50919 Malignant neoplasm of unspecified site of unspecified female breast: Secondary | ICD-10-CM

## 2010-05-03 DIAGNOSIS — E039 Hypothyroidism, unspecified: Secondary | ICD-10-CM | POA: Insufficient documentation

## 2010-05-03 DIAGNOSIS — E119 Type 2 diabetes mellitus without complications: Secondary | ICD-10-CM | POA: Insufficient documentation

## 2010-05-03 DIAGNOSIS — Z79899 Other long term (current) drug therapy: Secondary | ICD-10-CM | POA: Insufficient documentation

## 2010-05-04 ENCOUNTER — Encounter (HOSPITAL_COMMUNITY): Payer: BC Managed Care – PPO

## 2010-05-22 ENCOUNTER — Encounter (HOSPITAL_COMMUNITY): Payer: BC Managed Care – PPO | Attending: Oncology | Admitting: Oncology

## 2010-05-22 DIAGNOSIS — E039 Hypothyroidism, unspecified: Secondary | ICD-10-CM | POA: Insufficient documentation

## 2010-05-22 DIAGNOSIS — C50919 Malignant neoplasm of unspecified site of unspecified female breast: Secondary | ICD-10-CM

## 2010-05-22 DIAGNOSIS — E119 Type 2 diabetes mellitus without complications: Secondary | ICD-10-CM | POA: Insufficient documentation

## 2010-05-22 DIAGNOSIS — Z79899 Other long term (current) drug therapy: Secondary | ICD-10-CM | POA: Insufficient documentation

## 2010-05-26 NOTE — Procedures (Signed)
NAME:  Kelly Jacobson, Kelly Jacobson NO.:  [id number]   MEDICAL RECORD NO.:  [id number]          PATIENT TYPE:  OUT   LOCATION:  SLEEP CENTER                 FACILITY:  [facility]   PHYSICIAN:  Clinton D. Maple Hudson, M.D. DATE OF BIRTH:  [birth year]   DATE OF STUDY:  10/23/2003                              NOCTURNAL POLYSOMNOGRAM   STUDY DATE:  October 23, 2003   REFERRING PHYSICIAN:  Buren Kos, M.D.   INDICATION FOR STUDY:  Insomnia with sleep apnea.   EPWORTH SLEEPINESS SCORE:  3/24   NECK SIZE:  16 inches   BODY MASS INDEX:  42.8   WEIGHT:  290 pounds   MEDICATIONS:  Medication includes Wellbutrin.   SLEEP ARCHITECTURE:  Total sleep time 325 minutes with sleep efficiency 77%,  stage I was 3%, stage II 80%, stages III and IV 1%, REM was 15% of total  sleep time, sleep latency was 62%, REM latency was 228 minutes.  Awake after  sleep onset 33 minutes.  Arousal index 16.   RESPIRATORY DATA:  NPSG protocol.  RDI 3.9/hr which is within normal limits,  reflecting 21 hypopneas.  Events were not positional.  REM RDI was absent.   OXYGEN DATA:  Mild snoring with oxygen desaturation to a nadir of 90%.  Mean  oxygen saturation on room air through the study was 96%.   CARDIAC DATA:  Normal sinus rhythm.   MOVEMENT/PARASOMNIA:  A total of 106 limb jerks were recorded of which 12  were associated with arousal or awakening for a periodic limb movement with  arousal index of 2.2/hr which is mildly abnormal.   IMPRESSION/RECOMMENDATION:  Reduced rapid eye movement percentage may  reflect Wellbutrin therapy.  Occasional obstructive sleep-disordered  breathing events, hypopneas, within normal limits with an respiratory  disturbance index of 3.9/hr not indicating pathologic sleep-disordered  breathing.  Mild periodic limb movement  syndrome, 2.2/hr.  Consider treating this specifically with clonazepam or  Requip.  Alternatively consider treating as insomnia.      CDY/MEDQ  D:   10/24/2003 13:59:23  T:  10/25/2003 09:44:31  Job:  [id number]

## 2010-06-14 ENCOUNTER — Encounter (HOSPITAL_COMMUNITY): Payer: BC Managed Care – PPO

## 2010-06-16 ENCOUNTER — Encounter (HOSPITAL_COMMUNITY): Payer: BC Managed Care – PPO | Attending: Oncology

## 2010-06-16 DIAGNOSIS — C50919 Malignant neoplasm of unspecified site of unspecified female breast: Secondary | ICD-10-CM | POA: Insufficient documentation

## 2010-06-16 DIAGNOSIS — E119 Type 2 diabetes mellitus without complications: Secondary | ICD-10-CM | POA: Insufficient documentation

## 2010-06-16 DIAGNOSIS — Z452 Encounter for adjustment and management of vascular access device: Secondary | ICD-10-CM

## 2010-06-16 DIAGNOSIS — Z79899 Other long term (current) drug therapy: Secondary | ICD-10-CM | POA: Insufficient documentation

## 2010-06-16 DIAGNOSIS — E039 Hypothyroidism, unspecified: Secondary | ICD-10-CM | POA: Insufficient documentation

## 2010-06-19 ENCOUNTER — Other Ambulatory Visit (HOSPITAL_COMMUNITY): Payer: Self-pay | Admitting: Urology

## 2010-06-19 DIAGNOSIS — D49519 Neoplasm of unspecified behavior of unspecified kidney: Secondary | ICD-10-CM

## 2010-07-12 ENCOUNTER — Encounter (HOSPITAL_COMMUNITY): Payer: Self-pay | Admitting: *Deleted

## 2010-07-25 ENCOUNTER — Encounter (HOSPITAL_COMMUNITY): Payer: BC Managed Care – PPO | Attending: Oncology

## 2010-07-25 DIAGNOSIS — E119 Type 2 diabetes mellitus without complications: Secondary | ICD-10-CM | POA: Insufficient documentation

## 2010-07-25 DIAGNOSIS — E039 Hypothyroidism, unspecified: Secondary | ICD-10-CM | POA: Insufficient documentation

## 2010-07-25 DIAGNOSIS — C50919 Malignant neoplasm of unspecified site of unspecified female breast: Secondary | ICD-10-CM | POA: Insufficient documentation

## 2010-07-25 DIAGNOSIS — Z452 Encounter for adjustment and management of vascular access device: Secondary | ICD-10-CM

## 2010-07-25 DIAGNOSIS — Z79899 Other long term (current) drug therapy: Secondary | ICD-10-CM | POA: Insufficient documentation

## 2010-07-25 MED ORDER — HEPARIN SOD (PORK) LOCK FLUSH 100 UNIT/ML IV SOLN
INTRAVENOUS | Status: AC
  Filled 2010-07-25: qty 5

## 2010-07-26 ENCOUNTER — Ambulatory Visit (HOSPITAL_COMMUNITY)
Admission: RE | Admit: 2010-07-26 | Discharge: 2010-07-26 | Disposition: A | Discharge status: Home / Self Care | Payer: BC Managed Care – PPO | Source: Ambulatory Visit | Attending: Urology | Admitting: Urology

## 2010-07-26 ENCOUNTER — Encounter (HOSPITAL_COMMUNITY): Payer: BC Managed Care – PPO

## 2010-07-26 DIAGNOSIS — K7689 Other specified diseases of liver: Secondary | ICD-10-CM | POA: Insufficient documentation

## 2010-07-26 DIAGNOSIS — R160 Hepatomegaly, not elsewhere classified: Secondary | ICD-10-CM | POA: Insufficient documentation

## 2010-07-26 DIAGNOSIS — D49519 Neoplasm of unspecified behavior of unspecified kidney: Secondary | ICD-10-CM

## 2010-07-26 DIAGNOSIS — N289 Disorder of kidney and ureter, unspecified: Secondary | ICD-10-CM | POA: Insufficient documentation

## 2010-07-26 DIAGNOSIS — N281 Cyst of kidney, acquired: Secondary | ICD-10-CM | POA: Insufficient documentation

## 2010-07-26 DIAGNOSIS — Z853 Personal history of malignant neoplasm of breast: Secondary | ICD-10-CM | POA: Insufficient documentation

## 2010-07-26 LAB — CREATININE, SERUM: GFR calc Af Amer: >60 mL/min (ref >60)

## 2010-07-26 MED ORDER — GADOBENATE DIMEGLUMINE 529 MG/ML IV SOLN
20.0000 mL | Freq: Once | INTRAVENOUS | Status: AC | PRN
  Administered 2010-07-26: 20 mL via INTRAVENOUS

## 2010-07-28 MED ORDER — HEPARIN SOD (PORK) LOCK FLUSH 100 UNIT/ML IV SOLN
INTRAVENOUS | Status: AC
  Filled 2010-07-28: qty 5

## 2010-08-01 ENCOUNTER — Other Ambulatory Visit (HOSPITAL_COMMUNITY): Payer: Self-pay | Admitting: Oncology

## 2010-08-01 DIAGNOSIS — C50919 Malignant neoplasm of unspecified site of unspecified female breast: Secondary | ICD-10-CM

## 2010-09-06 ENCOUNTER — Encounter (HOSPITAL_COMMUNITY): Payer: BC Managed Care – PPO | Attending: Oncology

## 2010-09-06 ENCOUNTER — Encounter (HOSPITAL_COMMUNITY): Payer: BC Managed Care – PPO

## 2010-09-06 DIAGNOSIS — Z452 Encounter for adjustment and management of vascular access device: Secondary | ICD-10-CM

## 2010-09-06 DIAGNOSIS — C50919 Malignant neoplasm of unspecified site of unspecified female breast: Secondary | ICD-10-CM

## 2010-09-06 MED ORDER — HEPARIN SOD (PORK) LOCK FLUSH 100 UNIT/ML IV SOLN
INTRAVENOUS | Status: AC
  Administered 2010-09-06: 500 [IU] via INTRAVENOUS
  Filled 2010-09-06: qty 5

## 2010-09-06 MED ORDER — SODIUM CHLORIDE 0.9 % IJ SOLN
10.0000 mL | INTRAMUSCULAR | Status: DC | PRN
  Administered 2010-09-06: 10 mL via INTRAVENOUS

## 2010-09-06 MED ORDER — SODIUM CHLORIDE 0.9 % IJ SOLN
INTRAMUSCULAR | Status: AC
  Administered 2010-09-06: 10 mL via INTRAVENOUS
  Filled 2010-09-06: qty 10

## 2010-09-06 MED ORDER — HEPARIN SOD (PORK) LOCK FLUSH 100 UNIT/ML IV SOLN
500.0000 [IU] | Freq: Once | INTRAVENOUS | Status: AC
  Administered 2010-09-06: 500 [IU] via INTRAVENOUS

## 2010-09-06 NOTE — Progress Notes (Signed)
Port accessed per clinic protocol.  Good blood return. Flushed with ns 20 ml and HEPARIN 500 UNITS.  Tolerated well.

## 2010-09-13 ENCOUNTER — Ambulatory Visit (HOSPITAL_COMMUNITY): Payer: BC Managed Care – PPO

## 2010-09-18 ENCOUNTER — Encounter (HOSPITAL_COMMUNITY): Payer: BC Managed Care – PPO

## 2010-09-20 ENCOUNTER — Ambulatory Visit (HOSPITAL_COMMUNITY)
Admission: RE | Admit: 2010-09-20 | Discharge: 2010-09-20 | Disposition: A | Discharge status: Home / Self Care | Payer: BC Managed Care – PPO | Source: Ambulatory Visit | Attending: Oncology | Admitting: Oncology

## 2010-09-20 DIAGNOSIS — C50919 Malignant neoplasm of unspecified site of unspecified female breast: Secondary | ICD-10-CM

## 2010-09-20 DIAGNOSIS — Z853 Personal history of malignant neoplasm of breast: Secondary | ICD-10-CM | POA: Insufficient documentation

## 2010-10-18 ENCOUNTER — Encounter (HOSPITAL_COMMUNITY): Payer: BC Managed Care – PPO

## 2010-10-18 ENCOUNTER — Encounter (HOSPITAL_COMMUNITY): Payer: BC Managed Care – PPO | Attending: Oncology

## 2010-10-18 DIAGNOSIS — C50919 Malignant neoplasm of unspecified site of unspecified female breast: Secondary | ICD-10-CM | POA: Insufficient documentation

## 2010-10-18 DIAGNOSIS — Z452 Encounter for adjustment and management of vascular access device: Secondary | ICD-10-CM

## 2010-10-18 MED ORDER — HEPARIN SOD (PORK) LOCK FLUSH 100 UNIT/ML IV SOLN
INTRAVENOUS | Status: AC
  Administered 2010-10-18: 500 [IU] via INTRAVENOUS
  Filled 2010-10-18: qty 5

## 2010-10-18 MED ORDER — HEPARIN SOD (PORK) LOCK FLUSH 100 UNIT/ML IV SOLN
500.0000 [IU] | Freq: Once | INTRAVENOUS | Status: AC
  Administered 2010-10-18: 500 [IU] via INTRAVENOUS
  Filled 2010-10-18: qty 5

## 2010-10-18 MED ORDER — SODIUM CHLORIDE 0.9 % IJ SOLN
INTRAMUSCULAR | Status: AC
  Administered 2010-10-18: 10 mL via INTRAVENOUS
  Filled 2010-10-18: qty 10

## 2010-10-18 MED ORDER — SODIUM CHLORIDE 0.9 % IJ SOLN
10.0000 mL | INTRAMUSCULAR | Status: DC | PRN
  Administered 2010-10-18: 10 mL via INTRAVENOUS
  Filled 2010-10-18: qty 10

## 2010-10-18 NOTE — Progress Notes (Signed)
Good blood return. Tolerated well. 

## 2010-11-20 ENCOUNTER — Encounter (HOSPITAL_COMMUNITY): Payer: BC Managed Care – PPO | Attending: Oncology | Admitting: Oncology

## 2010-11-20 ENCOUNTER — Ambulatory Visit (HOSPITAL_COMMUNITY): Payer: BC Managed Care – PPO | Admitting: Oncology

## 2010-11-20 ENCOUNTER — Encounter (HOSPITAL_COMMUNITY): Payer: Self-pay | Admitting: Oncology

## 2010-11-20 VITALS — BP 138/82 | HR 106 | Temp 97.4°F | Ht 69.0 in | Wt 281.0 lb

## 2010-11-20 DIAGNOSIS — E119 Type 2 diabetes mellitus without complications: Secondary | ICD-10-CM | POA: Insufficient documentation

## 2010-11-20 DIAGNOSIS — R232 Flushing: Secondary | ICD-10-CM

## 2010-11-20 DIAGNOSIS — C50619 Malignant neoplasm of axillary tail of unspecified female breast: Secondary | ICD-10-CM

## 2010-11-20 DIAGNOSIS — E039 Hypothyroidism, unspecified: Secondary | ICD-10-CM

## 2010-11-20 DIAGNOSIS — C50919 Malignant neoplasm of unspecified site of unspecified female breast: Secondary | ICD-10-CM

## 2010-11-20 HISTORY — DX: Hypothyroidism, unspecified: E03.9

## 2010-11-20 HISTORY — DX: Type 2 diabetes mellitus without complications: E11.9

## 2010-11-20 HISTORY — DX: Flushing: R23.2

## 2010-11-20 LAB — LIPID PANEL
Cholesterol: 178 mg/dL (ref 0–200)
LDL Cholesterol: 94 mg/dL (ref 0–99)
Triglycerides: 200 mg/dL — ABNORMAL HIGH (ref <150)

## 2010-11-20 LAB — COMPREHENSIVE METABOLIC PANEL
BUN: 23 mg/dL (ref 6–23)
CO2: 30 mEq/L (ref 19–32)
Chloride: 98 mEq/L (ref 96–112)
Creatinine, Ser: 0.76 mg/dL (ref 0.50–1.10)
GFR calc Af Amer: >90 mL/min (ref >90)
GFR calc non Af Amer: >90 mL/min (ref >90)
Total Bilirubin: 0.7 mg/dL (ref 0.3–1.2)

## 2010-11-20 LAB — DIFFERENTIAL
Eosinophils Relative: 2 % (ref 0–5)
Lymphocytes Relative: 17 % (ref 12–46)
Monocytes Absolute: 0.4 10*3/uL (ref 0.1–1.0)
Monocytes Relative: 4 % (ref 3–12)
Neutro Abs: 7.8 10*3/uL — ABNORMAL HIGH (ref 1.7–7.7)

## 2010-11-20 LAB — CBC
HCT: 38.1 % (ref 36.0–46.0)
Hemoglobin: 12.2 g/dL (ref 12.0–15.0)
MCHC: 32 g/dL (ref 30.0–36.0)
MCV: 84.9 fL (ref 78.0–100.0)
WBC: 10.1 10*3/uL (ref 4.0–10.5)

## 2010-11-20 MED ORDER — SODIUM CHLORIDE 0.9 % IJ SOLN
INTRAMUSCULAR | Status: AC
  Administered 2010-11-20: 10 mL via INTRAVENOUS
  Filled 2010-11-20: qty 10

## 2010-11-20 MED ORDER — HEPARIN SOD (PORK) LOCK FLUSH 100 UNIT/ML IV SOLN
INTRAVENOUS | Status: AC
  Administered 2010-11-20: 500 [IU] via INTRAVENOUS
  Filled 2010-11-20: qty 5

## 2010-11-20 NOTE — Patient Instructions (Addendum)
[facility]  Discharge Instructions  RECOMMENDATIONS MADE BY THE CONSULTANT AND ANY TEST RESULTS WILL BE SENT TO YOUR REFERRING DOCTOR.   EXAM FINDINGS BY MD TODAY AND SIGNS AND SYMPTOMS TO REPORT TO CLINIC OR PRIMARY MD: exam per T. Jacalyn Lefevre PA.    INSTRUCTIONS GIVEN AND DISCUSSED: You may have port removed in December  SPECIAL INSTRUCTIONS/FOLLOW-UP: Return to Clinic on 6 months   I acknowledge that I have been informed and understand all the instructions given to me and received a copy. I do not have any more questions at this time, but understand that I may call the Specialty Clinic at [facility] at [phone] during business hours should I have any further questions or need assistance in obtaining follow-up care.    __________________________________________  _____________  __________ Signature of Patient or Authorized Representative            Date                   Time    __________________________________________ Nurse's Signature  Kelly Jacobson  [id number] 09-May-1960

## 2010-11-20 NOTE — Progress Notes (Signed)
Kelly Ribas, MD [address] [place] [address]  1. Breast ca  Liraglutide (VICTOZA) 18 MG/3ML SOLN, Cholecalciferol (D3-1000) 1000 UNITS capsule, CBC, Differential, Comprehensive metabolic panel, CBC, Differential, Comprehensive metabolic panel  2. Hypothyroidism  Lipid panel, TSH, Lipid panel, TSH  3. DM (diabetes mellitus)    4. Hot flashes secondary to Arimidex      CURRENT THERAPY: definitive surgery and lymph node dissection on 02/23/2009 followed by re-excision on 03/16/2009 with all margins clear.  ER 52%, PR 36%, Ki-67 33% Her2 negative.  S/P EC in dose dense fashion x 4 cycles followed by weekly Taxol.  S/P radiation Finishing in December 2011.  Now on Arimidex starting on 12/22/2009.   INTERVAL HISTORY: Kelly Jacobson 50 y.o. 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  Patient denies any complaints today. She does report continuation of hot flashes secondary to Arimidex therapy. The patient questions whether she can have her port removed this December. I spoke with Dr. Mariel Sleet and he agrees that the patient may have her port removed this coming December.  On physical examination, the patient asks me to examine her right axilla. I feel some scar tissue in the right axilla. I suggested that we can easily perform an ultrasound of the right axilla, but the patient has declined at this point time.  The patient has an upcoming appointment with Dr.Faera Donell Beers.    The patient is due to have her Port-A-Cath flushed today, and asks me if she can have the lab work that Dr. Clelia Croft typically performance be completed today with her port flushed. Looking back through her previous laboratory work, it appears as though Dr. Clelia Croft performs a TSH and lipid panel in conjunction with his regular laboratory work. I've asked the patient she was fasting and she reports that she was not. She  explains that she was not anticipating fasting for Dr. Alver Fisher lipid panel either.  As a result, we will add a TSH and lipid panel to her laboratory work which we're anticipating attaining today which includes a CBC, differential, and complete metabolic panel.  She was able to getting long acting Ambien medication authorized through her insurance company with the help of Dr. Clelia Croft. She reports that she is now sleeping much better.  Past Medical History  Diagnosis Date  . Diabetes mellitus   . Thyroid disease   . Breast cancer   . Hypothyroidism 11/20/2010  . DM (diabetes mellitus) 11/20/2010  . Hot flashes secondary to Arimidex 11/20/2010    has Infiltrating ductal carcinoma of breast; Hypothyroidism; DM (diabetes mellitus); and Hot flashes secondary to Arimidex on her problem list.     is allergic to darvocet.  [name] does not currently have medications on file.  Past Surgical History  Procedure Date  . Breast reduction surgery 1991  . Carpal tunnel release 04/06/1998  . Abdominal hysterectomy 2001    vaginal for endometriosis  . Debridement tennis elbow 2003  . Cervical fusion 01/15/2007  . Axillary cyst 01/25/2009  . Axillary node dissection 02/23/2009 and 03/16/2009  . Bilateral oophorectomy 04/19/2009  . Portacath placement 05/02/2009    Denies any headaches, dizziness, double vision, fevers, chills, night sweats, nausea, vomiting, diarrhea, constipation, chest pain, heart palpitations, shortness of breath, blood in stool, black tarry stool, urinary pain, urinary burning, urinary frequency, hematuria.   PHYSICAL EXAMINATION  ECOG PERFORMANCE STATUS: 0 -  Asymptomatic  Filed Vitals:   11/20/10 1334  BP: 138/82  Pulse: 106  Temp: 97.4 F (36.3 C)    GENERAL:alert, no distress, well nourished, well developed, comfortable, cooperative, obese and smiling SKIN: skin color, texture, turgor are normal HEAD: Normocephalic EYES: normal EARS: External ears  normal OROPHARYNX:mucous membranes are moist  NECK: supple, no adenopathy, no bruits, thyroid normal size, non-tender, without nodularity, no stridor, non-tender, trachea midline LYMPH:  no palpable lymphadenopathy, no hepatosplenomegaly BREAST:breasts appear normal, no suspicious masses, no skin or nipple changes or axillary nodes.  Healed scar noted with some fibrosis  noted in the right axilla. LUNGS: clear to auscultation and percussion HEART: regular rate & rhythm, no murmurs, no gallops, S1 normal and S2 normal ABDOMEN:abdomen soft, non-tender, obese, normal bowel sounds and no hepatosplenomegaly BACK: Back symmetric, no curvature. EXTREMITIES:less then 2 second capillary refill, no joint deformities, effusion, or inflammation, no edema, no skin discoloration, no clubbing, no cyanosis  NEURO: alert & oriented x 3 with fluent speech, no focal motor/sensory deficits, gait normal   PENDING LABS:CBC diff, CMET   RADIOGRAPHIC STUDIES:  09/20/10  *RADIOLOGY REPORT*  Clinical Data: History of right breast cancer arising in accessory  breast tissue in the right axilla. Annual examination. The patient  is asymptomatic.  DIGITAL DIAGNOSTIC BILATERAL MAMMOGRAM WITH CAD  Comparison: With priors  Findings: There are scattered fibroglandular densities  bilaterally. The parenchymal pattern is stable. Bilateral  reduction mammoplasty changes are present. No suspicious mass,  suspicious microcalcification, or architectural distortion is seen  in either breast. The area of surgical excision is too high in the  axilla to be included on mammogram images.  Mammographic images were processed with CAD.  IMPRESSION:  No mammographic evidence of malignancy in either breast. The  surgical excision site in the right axilla is not included on  mammographic views. Consider biennial surveillance breast MRI.  Diagnostic bilateral mammogram in 1 year is recommended.  BI-RADS CATEGORY 2: Benign finding(s).   Original Report Authenticated By: Britta Mccreedy, M.D.    ASSESSMENT:  1. Stage II right infiltrating ductal carcinoma 2. Hot flashes secondary to Arimidex therapy 3. DM 4. Obesity 5. Hypothyroidism   PLAN:  1. Declined Ultrasound of right axilla at this time.  Encouraged to contact the clinic of any changes. 2. Continue Arimidex 3. Port flush today 4. Lab work today: CBC diff, CMET, Lipids, and TSH.  5. Fax lab results to Dr. Clelia Croft. 6. Recommend port removal in December 2012 7. Return in 6 months for follow-up   All questions were answered. The patient knows to call the clinic with any problems, questions or concerns. We can certainly see the patient much sooner if necessary.  The patient and plan discussed with Glenford Peers, MD and he is in agreement with the aforementioned.  I spent 25 minutes counseling the patient face to face. The total time spent in the appointment was 30 minutes.  KEFALAS,THOMAS

## 2010-11-21 MED ORDER — SODIUM CHLORIDE 0.9 % IJ SOLN
10.0000 mL | INTRAMUSCULAR | Status: AC | PRN
  Administered 2010-11-20: 10 mL via INTRAVENOUS

## 2010-11-21 MED ORDER — HEPARIN SOD (PORK) LOCK FLUSH 100 UNIT/ML IV SOLN
500.0000 [IU] | Freq: Once | INTRAVENOUS | Status: AC
  Administered 2010-11-20: 500 [IU] via INTRAVENOUS

## 2010-11-21 NOTE — Progress Notes (Signed)
Addended by: Edythe Lynn A on: 11/21/2010 08:28 AM   Modules accepted: Orders, SmartSet

## 2010-11-21 NOTE — Progress Notes (Signed)
Kelly Jacobson presented for Portacath access and flush. Proper placement of portacath confirmed by CXR. Portacath located lt chest wall accessed with  H 20 needle. Good blood return present and labs drawn. Portacath flushed with 20ml NS and 500U/53ml Heparin and needle removed intact. Procedure without incident. Patient tolerated procedure well.

## 2010-11-28 ENCOUNTER — Encounter (HOSPITAL_COMMUNITY): Payer: BC Managed Care – PPO

## 2010-11-29 ENCOUNTER — Encounter (HOSPITAL_COMMUNITY): Payer: BC Managed Care – PPO

## 2010-12-25 ENCOUNTER — Encounter (INDEPENDENT_AMBULATORY_CARE_PROVIDER_SITE_OTHER): Payer: Self-pay

## 2010-12-26 ENCOUNTER — Ambulatory Visit (INDEPENDENT_AMBULATORY_CARE_PROVIDER_SITE_OTHER): Payer: BC Managed Care – PPO | Admitting: General Surgery

## 2010-12-26 ENCOUNTER — Encounter (INDEPENDENT_AMBULATORY_CARE_PROVIDER_SITE_OTHER): Payer: Self-pay | Admitting: General Surgery

## 2010-12-26 VITALS — BP 124/76 | HR 70 | Temp 96.9°F | Resp 18 | Ht 70.0 in | Wt 284.4 lb

## 2010-12-26 DIAGNOSIS — C50919 Malignant neoplasm of unspecified site of unspecified female breast: Secondary | ICD-10-CM

## 2010-12-26 NOTE — Assessment & Plan Note (Signed)
Doing well.  No evidence of disease.   Almost 2 years out. Will schedule to remove port a cath. Follow up in 6 months after port removal.

## 2010-12-26 NOTE — Patient Instructions (Signed)
IF YOU ARE TAKING ASPIRIN, COUMADIN/WARFARIN, PLAVIX, OR OTHER BLOOD THINNER, PLEASE LET us KNOW IMMEDIATELY.  WE WILL NEED TO DISCUSS WITH THE PRESCRIBING PROVIDER IF THESE ARE SAFE TO STOP. IF THESE ARE NOT STOPPED AT THE APPROPRIATE TIME, THIS WILL RESULT IN A DELAY FOR YOUR SURGERY.  DO NOT TAKE THESE MEDICATIONS OR IBUPROFEN/NAPROXEN WITHIN A WEEK BEFORE SURGERY.  The main risks of surgery are bleeding, infection, damage to other structures, and seroma (accumulation of fluid) under the incision site(s).    These complications may lead to additional procedures such as drainage of seroma/infection.    You may remove your dressings and may shower 48 hours after surgery.    No heavy lifting or strenuous activity for 1 week.  Many patients have some constipation in the week after surgery due to the narcotics and anesthesia.  You may need over the counter stool softeners or laxatives if you experience difficulty having bowel movements.    If the following occur, call our office at [phone]: If you have a fever over 101 or pain that is severe despite narcotics. If you have redness or drainage at the wound. If you develop persistent nausea or vomiting.  I will follow you back up in 1-4 weeks.    Please submit any paperwork about time off work/insurance forms to the front desk.

## 2010-12-26 NOTE — Progress Notes (Signed)
Chief Complaint  Patient presents with  . Other    Breast recheck    HISTORY: Pt is now almost 2 years out from [facility] for R breast cancer in the accessory breast tissue of the axilla.  She underwent chemo and XRT.  Her recent mammo is clear.  She has not had any new health problems or new breast issues.  She does have an area of tight scar tissue in the R axilla.  She is otherwise well.    Past Medical History  Diagnosis Date  . Diabetes mellitus   . Thyroid disease   . Hypothyroidism 11/20/2010  . DM (diabetes mellitus) 11/20/2010  . Hot flashes secondary to Arimidex 11/20/2010  . Breast cancer     right    Past Surgical History  Procedure Date  . Breast reduction surgery 1991  . Carpal tunnel release 04/06/1998  . Abdominal hysterectomy 2001    vaginal for endometriosis  . Debridement tennis elbow 2003  . Cervical fusion 01/15/2007  . Axillary cyst 01/25/2009  . Axillary node dissection 02/23/2009 and 03/16/2009  . Bilateral oophorectomy 04/19/2009  . Portacath placement 05/02/2009  . Trigger thumb surgery 02/01/2010  . Rectocele surgery 03/07/10    Current Outpatient Prescriptions  Medication Sig Dispense Refill  . azithromycin (ZITHROMAX) 250 MG tablet Take 250 mg by mouth daily.        Marland Kitchen ALPRAZolam (XANAX) 0.5 MG tablet Take 0.25 mg by mouth 3 (three) times daily as needed.        Marland Kitchen anastrozole (ARIMIDEX) 1 MG tablet Take 1 mg by mouth daily.        . B Complex-C-Folic Acid (MULTIVITAMIN, STRESS FORMULA) tablet Take 1 tablet by mouth daily.        Marland Kitchen buPROPion (WELLBUTRIN SR) 200 MG 12 hr tablet Take 200 mg by mouth 2 (two) times daily.        . calcium carbonate (OS-CAL) 600 MG TABS Take 600 mg by mouth daily.        Marland Kitchen docusate sodium (COLACE) 100 MG capsule Take 100 mg by mouth 2 (two) times daily as needed.       . hydrochlorothiazide 25 MG tablet Take 25 mg by mouth as needed.       Marland Kitchen HYDROcodone-acetaminophen (VICODIN) 5-500 MG per tablet Take 1 tablet by mouth every 6 (six)  hours as needed.        Marland Kitchen ibuprofen (ADVIL,MOTRIN) 800 MG tablet Take 800 mg by mouth every 8 (eight) hours as needed.        . insulin aspart (NOVOLOG) 100 UNIT/ML injection Inject into the skin 3 (three) times daily before meals. Sliding scale       . insulin glargine (LANTUS) 100 UNIT/ML injection Inject 40 Units into the skin daily.       . Iron 50 MG TBCR Take 50 mg by mouth daily.        Marland Kitchen levothyroxine (SYNTHROID, LEVOTHROID) 50 MCG tablet Take 50 mcg by mouth daily.        . Liraglutide (VICTOZA) 18 MG/3ML SOLN Inject 1.8 mLs into the skin daily.        . metFORMIN (GLUCOPHAGE) 1000 MG tablet Take 1,000 mg by mouth daily with breakfast.       . sitaGLIPtin (JANUVIA) 100 MG tablet Take 100 mg by mouth daily.        Marland Kitchen venlafaxine (EFFEXOR) 37.5 MG tablet Take 37.5 mg by mouth daily.        Marland Kitchen  zolpidem (AMBIEN) 10 MG tablet Take 10 mg by mouth at bedtime as needed.         No current facility-administered medications for this visit.   Facility-Administered Medications Ordered in Other Visits  Medication Dose Route Frequency Provider Last Rate Last Dose  . sodium chloride 0.9 % injection 10 mL  10 mL Intravenous PRN Dellis Anes, PA   10 mL at 11/20/10 1326     Allergies  Allergen Reactions  . Darvocet (Propoxyphene N-Acetaminophen) Other (See Comments)    hallucinations     History reviewed. No pertinent family history.   History   Social History  . Marital Status: Divorced    Spouse Name: N/A    Number of Children: N/A  . Years of Education: N/A   Social History Main Topics  . Smoking status: Never Smoker   . Smokeless tobacco: Never Used  . Alcohol Use: No  . Drug Use: No  . Sexually Active: None   Other Topics Concern  . None   Social History Narrative  . None     REVIEW OF SYSTEMS - PERTINENT POSITIVES ONLY: 12 point review of systems negative other than HPI and PMH.   EXAM: Filed Vitals:   12/26/10 1530  BP: 124/76  Pulse: 70  Temp: 96.9 F  (36.1 C)  Resp: 18    Gen:  No acute distress.  Well nourished and well groomed.   Neurological: Alert and oriented to person, place, and time. Coordination normal.  Head: Normocephalic and atraumatic.  Eyes: Conjunctivae are normal. Pupils are equal, round, and reactive to light. No scleral icterus.  Neck: Normal range of motion. Neck supple. No tracheal deviation or thyromegaly present.  Cardiovascular: Normal rate, regular rhythm, normal heart sounds and intact distal pulses.  Exam reveals no gallop and no friction rub.  No murmur heard. Respiratory: Effort normal.  No respiratory distress. No chest wall tenderness. Breath sounds normal.  No wheezes, rales or rhonchi.  Breast exam:  No masses in either breast.  Scar is tight between axillary incision and proximal arm incision.  No nipple discharge or skin dimpling.   GI: Soft. Bowel sounds are normal. The abdomen is soft and nontender.  There is no rebound and no guarding.  Musculoskeletal: Normal range of motion. Extremities are nontender.  Lymphadenopathy: No cervical, preauricular, postauricular or axillary adenopathy is present Skin: Skin is warm and dry. No rash noted. No diaphoresis. No erythema. No pallor. No clubbing, cyanosis, or edema.   Psychiatric: Normal mood and affect. Behavior is normal. Judgment and thought content normal.   RADIOLOGY RESULTS: Mammogram 07/2010 IMPRESSION:  No mammographic evidence of malignancy in either breast. The  surgical excision site in the right axilla is not included on  mammographic views. Consider biennial surveillance breast MRI.  Diagnostic bilateral mammogram in 1 year is recommended.  BI-RADS CATEGORY 2: Benign finding(s).  ASSESSMENT AND PLAN: Infiltrating ductal carcinoma of breast Doing well.  No evidence of disease.   Almost 2 years out. Will schedule to remove port a cath. Follow up in 6 months after port removal.     Maudry Diego MD Surgical Oncology, General and  Endocrine Surgery [facility]   Visit Diagnoses: 1. Infiltrating ductal carcinoma of breast     Primary Care Physician: Colette Ribas, MD

## 2011-01-05 ENCOUNTER — Encounter (HOSPITAL_COMMUNITY): Payer: Self-pay

## 2011-01-05 ENCOUNTER — Encounter (HOSPITAL_COMMUNITY): Payer: Self-pay | Admitting: Pharmacy Technician

## 2011-01-05 ENCOUNTER — Encounter (HOSPITAL_COMMUNITY)
Admission: RE | Admit: 2011-01-05 | Discharge: 2011-01-05 | Disposition: A | Discharge status: Home / Self Care | Payer: BC Managed Care – PPO | Source: Ambulatory Visit | Attending: General Surgery | Admitting: General Surgery

## 2011-01-05 HISTORY — DX: Depression, unspecified: F32.A

## 2011-01-05 HISTORY — DX: Anxiety disorder, unspecified: F41.9

## 2011-01-05 HISTORY — DX: Edema, unspecified: R60.9

## 2011-01-05 HISTORY — DX: Unspecified osteoarthritis, unspecified site: M19.90

## 2011-01-05 HISTORY — DX: Major depressive disorder, single episode, unspecified: F32.9

## 2011-01-05 LAB — BASIC METABOLIC PANEL
BUN: 16 mg/dL (ref 6–23)
Chloride: 101 mEq/L (ref 96–112)
GFR calc Af Amer: >90 mL/min (ref >90)
Potassium: 4.2 mEq/L (ref 3.5–5.1)
Sodium: 140 mEq/L (ref 135–145)

## 2011-01-05 LAB — SURGICAL PCR SCREEN
MRSA, PCR: NEGATIVE
Staphylococcus aureus: NEGATIVE

## 2011-01-05 LAB — CBC
Hemoglobin: 13 g/dL (ref 12.0–15.0)
MCH: 27.3 pg (ref 26.0–34.0)
RBC: 4.77 MIL/uL (ref 3.87–5.11)
WBC: 11.6 10*3/uL — ABNORMAL HIGH (ref 4.0–10.5)

## 2011-01-05 NOTE — Pre-Procedure Instructions (Signed)
20 Kelly Jacobson  01/05/2011   Your procedure is scheduled on  January 8th.  Report to [facility] at*10:40am  Call this number if you have problems the morning of surgery: [phone]   Remember:   Do not eat food:After Midnight.  May have clear liquids: up to 4 Hours before arrival. 6:40am.  Clear liquids include soda, tea, black coffee, apple or grape juice, broth.  Take these medicines the morning of surgery with A SIP OF WATER: Xanax, Wellbutrin, Vicodan, Effexor, Synthroid.   Do not wear jewelry, make-up or nail polish.  Do not wear lotions, powders, or perfumes. You may wear deodorant.  Do not shave 48 hours prior to surgery.  Do not bring valuables to the hospital.  Contacts, dentures or bridgework may not be worn into surgery.  Leave suitcase in the car. After surgery it may be brought to your room.  For patients admitted to the hospital, checkout time is 11:00 AM the day of discharge.   Patients discharged the day of surgery will not be allowed to drive home.  Name and phone number of your driver: Kelly Jacobson.  Special Instructions: CHG Shower Use Special Wash: 1/2 bottle night before surgery and 1/2 bottle morning of surgery.   Please read over the following fact sheets that you were given: Pain Booklet, Coughing and Deep Breathing, MRSA Information and Surgical Site Infection Prevention

## 2011-01-10 ENCOUNTER — Encounter (HOSPITAL_COMMUNITY): Payer: BC Managed Care – PPO

## 2011-01-15 MED ORDER — CEFAZOLIN SODIUM-DEXTROSE 2-3 GM-% IV SOLR
2.0000 g | INTRAVENOUS | Status: DC
  Filled 2011-01-15: qty 50

## 2011-01-16 ENCOUNTER — Encounter (HOSPITAL_COMMUNITY): Payer: Self-pay

## 2011-01-16 ENCOUNTER — Ambulatory Visit (HOSPITAL_COMMUNITY)
Admission: RE | Admit: 2011-01-16 | Discharge: 2011-01-16 | Disposition: A | Discharge status: Home / Self Care | Payer: BC Managed Care – PPO | Source: Ambulatory Visit | Attending: General Surgery | Admitting: General Surgery

## 2011-01-16 ENCOUNTER — Encounter (HOSPITAL_COMMUNITY)
Admission: RE | Disposition: A | Discharge status: Home / Self Care | Payer: Self-pay | Source: Ambulatory Visit | Attending: General Surgery

## 2011-01-16 ENCOUNTER — Ambulatory Visit (HOSPITAL_COMMUNITY): Payer: BC Managed Care – PPO | Admitting: Anesthesiology

## 2011-01-16 ENCOUNTER — Encounter (HOSPITAL_COMMUNITY): Payer: Self-pay | Admitting: Anesthesiology

## 2011-01-16 ENCOUNTER — Other Ambulatory Visit: Payer: Self-pay

## 2011-01-16 ENCOUNTER — Encounter (HOSPITAL_COMMUNITY): Payer: Self-pay | Admitting: General Surgery

## 2011-01-16 ENCOUNTER — Encounter (INDEPENDENT_AMBULATORY_CARE_PROVIDER_SITE_OTHER): Payer: Self-pay | Admitting: General Surgery

## 2011-01-16 ENCOUNTER — Ambulatory Visit (HOSPITAL_COMMUNITY): Payer: BC Managed Care – PPO

## 2011-01-16 DIAGNOSIS — Z01812 Encounter for preprocedural laboratory examination: Secondary | ICD-10-CM | POA: Insufficient documentation

## 2011-01-16 DIAGNOSIS — Q828 Other specified congenital malformations of skin: Secondary | ICD-10-CM

## 2011-01-16 DIAGNOSIS — C50919 Malignant neoplasm of unspecified site of unspecified female breast: Secondary | ICD-10-CM | POA: Insufficient documentation

## 2011-01-16 DIAGNOSIS — E119 Type 2 diabetes mellitus without complications: Secondary | ICD-10-CM | POA: Insufficient documentation

## 2011-01-16 DIAGNOSIS — Z452 Encounter for adjustment and management of vascular access device: Secondary | ICD-10-CM

## 2011-01-16 HISTORY — PX: PORT-A-CATH REMOVAL: SHX5289

## 2011-01-16 SURGERY — REMOVAL PORT-A-CATH
Anesthesia: Monitor Anesthesia Care | Site: Chest | Wound class: Clean

## 2011-01-16 MED ORDER — OXYCODONE-ACETAMINOPHEN 5-325 MG PO TABS: 1.0000 | ORAL_TABLET | ORAL | Status: DC | PRN

## 2011-01-16 MED ORDER — FENTANYL CITRATE 0.05 MG/ML IJ SOLN
INTRAMUSCULAR | Status: DC | PRN
  Administered 2011-01-16: 150 ug via INTRAVENOUS

## 2011-01-16 MED ORDER — LACTATED RINGERS IV SOLN
INTRAVENOUS | Status: DC | PRN
  Administered 2011-01-16: 12:00:00 via INTRAVENOUS

## 2011-01-16 MED ORDER — DROPERIDOL 2.5 MG/ML IJ SOLN: 0.6250 mg | INTRAMUSCULAR | Status: DC | PRN

## 2011-01-16 MED ORDER — 0.9 % SODIUM CHLORIDE (POUR BTL) OPTIME
TOPICAL | Status: DC | PRN
  Administered 2011-01-16: 1000 mL

## 2011-01-16 MED ORDER — LIDOCAINE HCL 1 % IJ SOLN
INTRAMUSCULAR | Status: DC | PRN
  Administered 2011-01-16: 13:00:00 via SUBCUTANEOUS

## 2011-01-16 MED ORDER — MIDAZOLAM HCL 5 MG/5ML IJ SOLN
INTRAMUSCULAR | Status: DC | PRN
  Administered 2011-01-16: 2 mg via INTRAVENOUS

## 2011-01-16 MED ORDER — HYDROMORPHONE HCL PF 1 MG/ML IJ SOLN: 0.2500 mg | INTRAMUSCULAR | Status: DC | PRN

## 2011-01-16 MED ORDER — PROPOFOL 10 MG/ML IV EMUL
INTRAVENOUS | Status: DC | PRN
  Administered 2011-01-16: 40 mg via INTRAVENOUS
  Administered 2011-01-16: 20 mg via INTRAVENOUS
  Administered 2011-01-16: 40 mg via INTRAVENOUS
  Administered 2011-01-16: 20 mg via INTRAVENOUS
  Administered 2011-01-16: 40 mg via INTRAVENOUS
  Administered 2011-01-16 (×4): 20 mg via INTRAVENOUS

## 2011-01-16 SURGICAL SUPPLY — 37 items
BLADE SURG 11 STRL SS (BLADE) ×2 IMPLANT
BLADE SURG 15 STRL LF DISP TIS (BLADE) ×1 IMPLANT
BLADE SURG 15 STRL SS (BLADE) ×1
CHLORAPREP W/TINT 10.5 ML (MISCELLANEOUS) ×2 IMPLANT
CLOTH BEACON ORANGE TIMEOUT ST (SAFETY) ×2 IMPLANT
COVER SURGICAL LIGHT HANDLE (MISCELLANEOUS) ×2 IMPLANT
DECANTER SPIKE VIAL GLASS SM (MISCELLANEOUS) ×4 IMPLANT
DERMABOND ADVANCED (GAUZE/BANDAGES/DRESSINGS) ×1
DERMABOND ADVANCED .7 DNX12 (GAUZE/BANDAGES/DRESSINGS) ×1 IMPLANT
DRAPE PED LAPAROTOMY (DRAPES) ×2 IMPLANT
DRAPE UTILITY XL STRL (DRAPES) ×4 IMPLANT
ELECT CAUTERY BLADE 6.4 (BLADE) ×2 IMPLANT
ELECT REM PT RETURN 9FT ADLT (ELECTROSURGICAL) ×2
ELECTRODE REM PT RTRN 9FT ADLT (ELECTROSURGICAL) ×1 IMPLANT
GAUZE SPONGE 4X4 16PLY XRAY LF (GAUZE/BANDAGES/DRESSINGS) ×2 IMPLANT
GLOVE BIO SURGEON STRL SZ 6 (GLOVE) ×2 IMPLANT
GLOVE BIO SURGEON STRL SZ7.5 (GLOVE) ×2 IMPLANT
GLOVE BIOGEL PI IND STRL 6.5 (GLOVE) ×1 IMPLANT
GLOVE BIOGEL PI IND STRL 7.5 (GLOVE) ×1 IMPLANT
GLOVE BIOGEL PI INDICATOR 6.5 (GLOVE) ×1
GLOVE BIOGEL PI INDICATOR 7.5 (GLOVE) ×1
GOWN PREVENTION PLUS XXLARGE (GOWN DISPOSABLE) ×2 IMPLANT
GOWN STRL NON-REIN LRG LVL3 (GOWN DISPOSABLE) ×2 IMPLANT
KIT BASIN OR (CUSTOM PROCEDURE TRAY) ×2 IMPLANT
KIT ROOM TURNOVER OR (KITS) ×2 IMPLANT
NEEDLE HYPO 25GX1X1/2 BEV (NEEDLE) ×2 IMPLANT
NS IRRIG 1000ML POUR BTL (IV SOLUTION) ×2 IMPLANT
PACK SURGICAL SETUP 50X90 (CUSTOM PROCEDURE TRAY) ×2 IMPLANT
PAD ARMBOARD 7.5X6 YLW CONV (MISCELLANEOUS) ×4 IMPLANT
PENCIL BUTTON HOLSTER BLD 10FT (ELECTRODE) ×2 IMPLANT
SUT MON AB 4-0 PC3 18 (SUTURE) ×2 IMPLANT
SUT VIC AB 3-0 SH 27 (SUTURE) ×1
SUT VIC AB 3-0 SH 27X BRD (SUTURE) ×1 IMPLANT
SYR CONTROL 10ML LL (SYRINGE) ×2 IMPLANT
TOWEL OR 17X24 6PK STRL BLUE (TOWEL DISPOSABLE) ×2 IMPLANT
TOWEL OR 17X26 10 PK STRL BLUE (TOWEL DISPOSABLE) ×2 IMPLANT
WATER STERILE IRR 1000ML POUR (IV SOLUTION) IMPLANT

## 2011-01-16 NOTE — Interval H&P Note (Signed)
History and Physical Interval Note:  01/16/2011 12:10 PM  Kelly Jacobson  has presented today for surgery, with the diagnosis of Right breast cancer.  The various methods of treatment have been discussed with the patient and family. After consideration of risks, benefits and other options for treatment, the patient has consented to  Procedure(s): REMOVAL PORT-A-CATH as a surgical intervention .  The patients' history has been reviewed, patient examined, no change in status, stable for surgery.  I have reviewed the patients' chart and labs.  Questions were answered to the patient's satisfaction.     Guerin Lashomb

## 2011-01-16 NOTE — Op Note (Signed)
  PRE-OPERATIVE DIAGNOSIS:  un-needed Port-A-Cath for R breast cancer and L neck skin tag  POST-OPERATIVE DIAGNOSIS:  Same   PROCEDURE:  Procedure(s):  REMOVAL PORT-A-CATH and excision of L neck skin tag  SURGEON:  Surgeon(s):  Almond Lint, MD  ANESTHESIA:   General + local  EBL:   Minimal  SPECIMEN:  None  Complications : none known  Procedure:   Pt was  identified in the holding area and taken to the operating room where she was placed supine on the operating room table.  General anesthesia was induced via LMA.  The R upper chest and lower neck was prepped and draped.  The skin under the skin tag was anesthetized.  The skin tag was elevated with forceps and excised with tenotomy scissors.    The prior incision was anesthetized with local anesthetic.  The incision was opened with a #15 blade.  The subcutaneous tissue was divided with the cautery.  The port was identified and the capsule opened.  The four 2-0 prolene sutures were removed.  The port was then removed and pressure held on the tract.  The catheter appeared intact without evidence of breakage.  The wound was inspected for hemostasis, which was achieved with cautery.  The wound was closed with 3-0 vicryl deep dermal interrupted sutures and 4-0 Monocryl running subcuticular suture.  The wound was cleaned, dried, and dressed with dermabond. The skin tag site was dressed with a steristrip.  The patient was awakened from anesthesia and taken to the PACU in stable condition.  Needle, sponge, and instrument counts are correct.

## 2011-01-16 NOTE — Transfer of Care (Signed)
Immediate Anesthesia Transfer of Care Note  Patient: Kelly Jacobson  Procedure(s) Performed:  REMOVAL PORT-A-CATH - Removal port-a-cath.  Patient Location: PACU  Anesthesia Type: MAC  Level of Consciousness: awake, alert  and oriented  Airway & Oxygen Therapy: Patient Spontanous Breathing  Post-op Assessment: Report given to PACU RN, Post -op Vital signs reviewed and stable and Patient moving all extremities X 4  Post vital signs: Reviewed and stable  Complications: No apparent anesthesia complications

## 2011-01-16 NOTE — Preoperative (Signed)
Beta Blockers   Reason not to administer Beta Blockers:Not Applicable 

## 2011-01-16 NOTE — Anesthesia Preprocedure Evaluation (Addendum)
Anesthesia Evaluation  Patient identified by MRN, date of birth, ID band Patient awake and Patient confused    Reviewed: Allergy & Precautions, H&P , NPO status , Patient's Chart, lab work & pertinent test results  History of Anesthesia Complications Negative for: history of anesthetic complications  Airway Mallampati: II TM Distance: >3 FB Neck ROM: Full    Dental  (+) Teeth Intact and Dental Advisory Given   Pulmonary  clear to auscultation  Pulmonary exam normal       Cardiovascular Regular     Neuro/Psych PSYCHIATRIC DISORDERS Anxiety Depression    GI/Hepatic   Endo/Other  Diabetes mellitus-, Well Controlled, Type 2Hypothyroidism Morbid obesity  Renal/GU      Musculoskeletal   Abdominal   Peds  Hematology   Anesthesia Other Findings   Reproductive/Obstetrics History of Right Breast Cancer  S/p chemo and radiation                          Anesthesia Physical Anesthesia Plan  ASA: III  Anesthesia Plan: MAC   Post-op Pain Management:    Induction: Intravenous  Airway Management Planned: Mask  Additional Equipment:   Intra-op Plan:   Post-operative Plan:   Informed Consent: I have reviewed the patients History and Physical, chart, labs and discussed the procedure including the risks, benefits and alternatives for the proposed anesthesia with the patient or authorized representative who has indicated his/her understanding and acceptance.     Plan Discussed with: CRNA, Anesthesiologist and Surgeon  Anesthesia Plan Comments:        Anesthesia Quick Evaluation

## 2011-01-16 NOTE — H&P (View-Only) (Signed)
Chief Complaint  Patient presents with  . Other    Breast recheck    HISTORY: Pt is now almost 2 years out from BCT for R breast cancer in the accessory breast tissue of the axilla.  She underwent chemo and XRT.  Her recent mammo is clear.  She has not had any new health problems or new breast issues.  She does have an area of tight scar tissue in the R axilla.  She is otherwise well.    Past Medical History  Diagnosis Date  . Diabetes mellitus   . Thyroid disease   . Hypothyroidism 11/20/2010  . DM (diabetes mellitus) 11/20/2010  . Hot flashes secondary to Arimidex 11/20/2010  . Breast cancer     right    Past Surgical History  Procedure Date  . Breast reduction surgery 1991  . Carpal tunnel release 04/06/1998  . Abdominal hysterectomy 2001    vaginal for endometriosis  . Debridement tennis elbow 2003  . Cervical fusion 01/15/2007  . Axillary cyst 01/25/2009  . Axillary node dissection 02/23/2009 and 03/16/2009  . Bilateral oophorectomy 04/19/2009  . Portacath placement 05/02/2009  . Trigger thumb surgery 02/01/2010  . Rectocele surgery 03/07/10    Current Outpatient Prescriptions  Medication Sig Dispense Refill  . azithromycin (ZITHROMAX) 250 MG tablet Take 250 mg by mouth daily.        . ALPRAZolam (XANAX) 0.5 MG tablet Take 0.25 mg by mouth 3 (three) times daily as needed.        . anastrozole (ARIMIDEX) 1 MG tablet Take 1 mg by mouth daily.        . B Complex-C-Folic Acid (MULTIVITAMIN, STRESS FORMULA) tablet Take 1 tablet by mouth daily.        . buPROPion (WELLBUTRIN SR) 200 MG 12 hr tablet Take 200 mg by mouth 2 (two) times daily.        . calcium carbonate (OS-CAL) 600 MG TABS Take 600 mg by mouth daily.        . docusate sodium (COLACE) 100 MG capsule Take 100 mg by mouth 2 (two) times daily as needed.       . hydrochlorothiazide 25 MG tablet Take 25 mg by mouth as needed.       . HYDROcodone-acetaminophen (VICODIN) 5-500 MG per tablet Take 1 tablet by mouth every 6 (six)  hours as needed.        . ibuprofen (ADVIL,MOTRIN) 800 MG tablet Take 800 mg by mouth every 8 (eight) hours as needed.        . insulin aspart (NOVOLOG) 100 UNIT/ML injection Inject into the skin 3 (three) times daily before meals. Sliding scale       . insulin glargine (LANTUS) 100 UNIT/ML injection Inject 40 Units into the skin daily.       . Iron 50 MG TBCR Take 50 mg by mouth daily.        . levothyroxine (SYNTHROID, LEVOTHROID) 50 MCG tablet Take 50 mcg by mouth daily.        . Liraglutide (VICTOZA) 18 MG/3ML SOLN Inject 1.8 mLs into the skin daily.        . metFORMIN (GLUCOPHAGE) 1000 MG tablet Take 1,000 mg by mouth daily with breakfast.       . sitaGLIPtin (JANUVIA) 100 MG tablet Take 100 mg by mouth daily.        . venlafaxine (EFFEXOR) 37.5 MG tablet Take 37.5 mg by mouth daily.        .   zolpidem (AMBIEN) 10 MG tablet Take 10 mg by mouth at bedtime as needed.         No current facility-administered medications for this visit.   Facility-Administered Medications Ordered in Other Visits  Medication Dose Route Frequency Provider Last Rate Last Dose  . sodium chloride 0.9 % injection 10 mL  10 mL Intravenous PRN Thomas Kefalas, PA   10 mL at 11/20/10 1326     Allergies  Allergen Reactions  . Darvocet (Propoxyphene N-Acetaminophen) Other (See Comments)    hallucinations     History reviewed. No pertinent family history.   History   Social History  . Marital Status: Divorced    Spouse Name: N/A    Number of Children: N/A  . Years of Education: N/A   Social History Main Topics  . Smoking status: Never Smoker   . Smokeless tobacco: Never Used  . Alcohol Use: No  . Drug Use: No  . Sexually Active: None   Other Topics Concern  . None   Social History Narrative  . None     REVIEW OF SYSTEMS - PERTINENT POSITIVES ONLY: 12 point review of systems negative other than HPI and PMH.   EXAM: Filed Vitals:   12/26/10 1530  BP: 124/76  Pulse: 70  Temp: 96.9 F  (36.1 C)  Resp: 18    Gen:  No acute distress.  Well nourished and well groomed.   Neurological: Alert and oriented to person, place, and time. Coordination normal.  Head: Normocephalic and atraumatic.  Eyes: Conjunctivae are normal. Pupils are equal, round, and reactive to light. No scleral icterus.  Neck: Normal range of motion. Neck supple. No tracheal deviation or thyromegaly present.  Cardiovascular: Normal rate, regular rhythm, normal heart sounds and intact distal pulses.  Exam reveals no gallop and no friction rub.  No murmur heard. Respiratory: Effort normal.  No respiratory distress. No chest wall tenderness. Breath sounds normal.  No wheezes, rales or rhonchi.  Breast exam:  No masses in either breast.  Scar is tight between axillary incision and proximal arm incision.  No nipple discharge or skin dimpling.   GI: Soft. Bowel sounds are normal. The abdomen is soft and nontender.  There is no rebound and no guarding.  Musculoskeletal: Normal range of motion. Extremities are nontender.  Lymphadenopathy: No cervical, preauricular, postauricular or axillary adenopathy is present Skin: Skin is warm and dry. No rash noted. No diaphoresis. No erythema. No pallor. No clubbing, cyanosis, or edema.   Psychiatric: Normal mood and affect. Behavior is normal. Judgment and thought content normal.   RADIOLOGY RESULTS: Mammogram 07/2010 IMPRESSION:  No mammographic evidence of malignancy in either breast. The  surgical excision site in the right axilla is not included on  mammographic views. Consider biennial surveillance breast MRI.  Diagnostic bilateral mammogram in 1 year is recommended.  BI-RADS CATEGORY 2: Benign finding(s).  ASSESSMENT AND PLAN: Infiltrating ductal carcinoma of breast Doing well.  No evidence of disease.   Almost 2 years out. Will schedule to remove port a cath. Follow up in 6 months after port removal.     Adnan Vanvoorhis L Corban Kistler MD Surgical Oncology, General and  Endocrine Surgery [facility]   Visit Diagnoses: 1. Infiltrating ductal carcinoma of breast     Primary Care Physician: GOLDING,JOHN CABOT, MD    

## 2011-01-16 NOTE — Anesthesia Postprocedure Evaluation (Signed)
Anesthesia Post Note  Patient: Kelly Jacobson  Procedure(s) Performed:  REMOVAL PORT-A-CATH - Removal port-a-cath.  Anesthesia type: MAC  Patient location: PACU  Post pain: Pain level controlled  Post assessment: Patient's Cardiovascular Status Stable  Last Vitals:  Filed Vitals:   01/16/11 1404  BP: 122/79  Pulse: 77  Temp: 36.5 C  Resp: 16    Post vital signs: Reviewed and stable  Level of consciousness: sedated  Complications: No apparent anesthesia complications

## 2011-01-16 NOTE — Progress Notes (Signed)
Report given to sharon rn as caregiver 

## 2011-01-17 ENCOUNTER — Encounter (HOSPITAL_COMMUNITY): Payer: Self-pay | Admitting: General Surgery

## 2011-02-19 ENCOUNTER — Ambulatory Visit (INDEPENDENT_AMBULATORY_CARE_PROVIDER_SITE_OTHER): Payer: BC Managed Care – PPO | Admitting: General Surgery

## 2011-02-19 ENCOUNTER — Encounter (INDEPENDENT_AMBULATORY_CARE_PROVIDER_SITE_OTHER): Payer: Self-pay | Admitting: General Surgery

## 2011-02-19 VITALS — BP 142/84 | HR 72 | Resp 16 | Ht 70.0 in | Wt 294.6 lb

## 2011-02-19 DIAGNOSIS — C50919 Malignant neoplasm of unspecified site of unspecified female breast: Secondary | ICD-10-CM

## 2011-02-19 NOTE — Patient Instructions (Signed)
Follow up in 6 months.  Get mammograms as scheduled.

## 2011-02-19 NOTE — Assessment & Plan Note (Signed)
No issues after port a cath removal.   Doing well. Will follow up in 6 months.

## 2011-02-19 NOTE — Progress Notes (Signed)
Subjective:     Patient ID: Kelly Jacobson, female   DOB: May 28, 1960, 51 y.o.   MRN: [id number]  HPI Doing well after port removal.  Still a little sore, but no longer taking analgesics.    Review of Systems O/w negative    Objective:   Physical Exam Small amount of swelling.  Some stitch irritation medially    Assessment/Plan:     Infiltrating ductal carcinoma of breast No issues after port a cath removal.   Doing well. Will follow up in 6 months.

## 2011-05-21 ENCOUNTER — Encounter (HOSPITAL_COMMUNITY): Payer: Self-pay | Admitting: Oncology

## 2011-05-21 ENCOUNTER — Encounter (HOSPITAL_COMMUNITY): Payer: BC Managed Care – PPO | Attending: Oncology | Admitting: Oncology

## 2011-05-21 VITALS — BP 126/84 | HR 90 | Temp 98.2°F | Wt 301.4 lb

## 2011-05-21 DIAGNOSIS — Z170 Estrogen receptor positive status [ER+]: Secondary | ICD-10-CM

## 2011-05-21 DIAGNOSIS — C50619 Malignant neoplasm of axillary tail of unspecified female breast: Secondary | ICD-10-CM

## 2011-05-21 DIAGNOSIS — R232 Flushing: Secondary | ICD-10-CM

## 2011-05-21 DIAGNOSIS — R1310 Dysphagia, unspecified: Secondary | ICD-10-CM

## 2011-05-21 DIAGNOSIS — N951 Menopausal and female climacteric states: Secondary | ICD-10-CM

## 2011-05-21 DIAGNOSIS — C50919 Malignant neoplasm of unspecified site of unspecified female breast: Secondary | ICD-10-CM

## 2011-05-21 MED ORDER — SERTRALINE HCL 25 MG PO TABS: 25.0000 mg | ORAL_TABLET | Freq: Every day | ORAL | Status: DC

## 2011-05-21 NOTE — Patient Instructions (Signed)
[facility]  Discharge Instructions Kelly Jacobson  [id number] 06/16/1960 Dr. Glenford Peers  RECOMMENDATIONS MADE BY THE CONSULTANT AND ANY TEST RESULTS WILL BE SENT TO YOUR REFERRING DOCTOR.   EXAM FINDINGS BY MD TODAY AND SIGNS AND SYMPTOMS TO REPORT TO CLINIC OR PRIMARY MD:  Labs to be done at Dr. Margaretmary Eddy office. MEDICATIONS PRESCRIBED: zoloft   INSTRUCTIONS GIVEN AND DISCUSSED: Will try zoloft for sweats Call us if swallowing not better in 4 weeks  SPECIAL INSTRUCTIONS/FOLLOW-UP: Return to Clinic in 6 months   I acknowledge that I have been informed and understand all the instructions given to me and received a copy. I do not have any more questions at this time, but understand that I may call the Specialty [facility] at [facility] at [phone] during business hours should I have any further questions or need assistance in obtaining follow-up care.    __________________________________________  _____________  __________ Signature of Patient or Authorized Representative            Date                   Time    __________________________________________ Nurse's Signature

## 2011-05-21 NOTE — Progress Notes (Signed)
Kelly Ribas, MD, MD [address] [place] [address]  1. Hot flashes secondary to Arimidex  Multiple Vitamins-Minerals (WOMENS DAILY FORMULA PO), sertraline (ZOLOFT) 25 MG tablet  2. Infiltrating ductal carcinoma of breast      CURRENT THERAPY: Observation  INTERVAL HISTORY: Kelly Jacobson 51 y.o. female returns for  regular  visit for followup of Stage II (T1c, N1), grade 2 of right breast infiltrating ductal carcinoma.  Definitive surgery and lymph node dissection on 02/23/2009 followed by re-excision on 03/16/2009 with all margins clear. ER 52%, PR 36%, Ki-67 33% Her2 negative. S/P EC in dose dense fashion x 4 cycles followed by weekly Taxol. S/P radiation Finishing in December 2011. Now on Arimidex starting on 12/22/2009.  Patient seen for followup. She has been taking her Arimidex as prescribed. She complains of hot flashes that awaken her 4-5 times every night. She reports that wakes and her head is wet. The hot flashes are really beginning to bother her because is affecting her sleeping pattern. She was on Effexor 37.5 mg daily for this but is not prove to be beneficial for her. I've offered her 2 choices. We can stop the Effexor and try Zoloft for the hot flashes. We can also switch from Arimidex to another aromatase inhibitor such as Femara or Aromasin. I will be happy to check her paper chart to see if she's had either of these in the past. She reports that she thinks she had tamoxifen in the past. She is chosen to try the Zoloft at this point time. I will give her 30 day trial this medication. If it is not beneficial for her, we will consider switching to Aromasin or Femara. The patient understands that hot flashes are a categorical side effects. It may be better or worse by switching either these medications.  Also complains of some dysphagia to solids. She denies any problems with liquids. She reports that when she eats stake, chicken, or bread for  example it goes down the "wrong pipe" which causes her to cough.  She reports that she does not have this difficulty with some foods such as fish or shrimp. She slated lastly she had shrimp and did not have any problems with this. She denies noticing any pattern with liquids causing this dysphagia. She reports that her father had stricturing of the esophagus which required stretching in lifetime. I've offered her a gastroenterology consultation for this. At this point time should like to wait and see if this resolves him. I've asked her to call the clinic in 4 weeks time to see if this resolves and see how her hot flashes are doing.  Oncologically, the patient denies any complaints. She is due for her mammogram in August of 2013. She will maintain this appointment.  The patient is losing weight because she is trying to.  She has lost quite a few lbs, but most recently, she has gained some of her lost weight back.  She also mentions some muscle cramping of her LE, so we will check a K+ and Mg level on her.    Past Medical History  Diagnosis Date  . Diabetes mellitus   . Thyroid disease   . DM (diabetes mellitus) 11/20/2010  . Hot flashes secondary to Arimidex 11/20/2010  . Breast cancer     right  . Hypothyroidism 11/20/2010    Synthroid  . Edema     hands and feet at times, takes as a direutic  .  Depression     wellburtin  . Arthritis     Hands and legs  . Anxiety     Xanax    has Infiltrating ductal carcinoma of breast; Hypothyroidism; DM (diabetes mellitus); and Hot flashes secondary to Arimidex on her problem list.     is allergic to darvocet and tape.  [name] had no medications administered during this visit.  Past Surgical History  Procedure Date  . Breast reduction surgery 1991  . Carpal tunnel release 04/06/1998  . Abdominal hysterectomy 2001    vaginal for endometriosis  . Debridement tennis elbow 2003  . Cervical fusion 01/15/2007  . Axillary cyst 01/25/2009  .  Axillary node dissection 02/23/2009 and 03/16/2009  . Bilateral oophorectomy 04/19/2009  . Portacath placement 05/02/2009  . Trigger thumb surgery 02/01/2010  . Rectocele surgery 03/07/10  . Port-a-cath removal 01/16/2011    Procedure: REMOVAL PORT-A-CATH;  Surgeon: Almond Lint, MD;  Location: [facility] OR;  Service: General;  Laterality: N/A;  Removal port-a-cath.    Denies any headaches, dizziness, double vision, fevers, chills, nausea, vomiting, diarrhea, constipation, chest pain, heart palpitations, shortness of breath, blood in stool, black tarry stool, urinary pain, urinary burning, urinary frequency, hematuria.   PHYSICAL EXAMINATION  ECOG PERFORMANCE STATUS: 1 - Symptomatic but completely ambulatory  Filed Vitals:   05/21/11 1212  BP: 126/84  Pulse: 90  Temp: 98.2 F (36.8 C)    GENERAL:alert, no distress, well nourished, well developed, comfortable, cooperative, obese, smiling and tired. SKIN: skin color, texture, turgor are normal, no rashes or significant lesions HEAD: Normocephalic, No masses, lesions, tenderness or abnormalities EYES: normal, EOMI, Conjunctiva are pink and non-injected EARS: External ears normal OROPHARYNX:lips, buccal mucosa, and tongue normal and mucous membranes are moist  NECK: supple, no adenopathy, no stridor, non-tender, trachea midline LYMPH:  no palpable lymphadenopathy BREAST:patient declines to have breast exam LUNGS: clear to auscultation and percussion HEART: regular rate & rhythm, no murmurs, no gallops, S1 normal and S2 normal ABDOMEN:abdomen soft, non-tender, obese and normal bowel sounds BACK: Back symmetric, no curvature., No CVA tenderness EXTREMITIES:less then 2 second capillary refill, no joint deformities, effusion, or inflammation, no edema, no skin discoloration, no clubbing, no cyanosis  NEURO: alert & oriented x 3 with fluent speech, no focal motor/sensory deficits, gait normal   ASSESSMENT:  1. Stage II right infiltrating ductal  carcinoma  2. Hot flashes secondary to Arimidex therapy 3. 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 DM  5. Obesity  6. Hypothyroidism   PLAN:  1. Rx for lab work by Dr. Clelia Croft: CBC diff, CMET, LDH, ESR, Mg level. 2. I e-scribed Zoloft 25 mg #30 with 0 refills to [employer].  She is to take 1 tablet daily.  This is to hopefully help with hot flashes secondary to Arimidex.  We will give this a trial. 3. Patient advised to call the clinic in 4 weeks to inform her if her dysphagia has improved/resolved.  If not, will refer to GI. 4. Patient will have mammogram performed in 09/2011 as scheduled  5. Return in 6 months for follow-up.  Will hear from the patient in the interim.  All questions were answered. The patient knows to call the clinic with any problems, questions or concerns. We can certainly see the patient much sooner if necessary.  Alyn Jurney

## 2011-06-07 ENCOUNTER — Telehealth (HOSPITAL_COMMUNITY): Payer: Self-pay | Admitting: *Deleted

## 2011-06-07 ENCOUNTER — Telehealth (HOSPITAL_COMMUNITY): Payer: Self-pay | Admitting: Oncology

## 2011-06-07 DIAGNOSIS — C50919 Malignant neoplasm of unspecified site of unspecified female breast: Secondary | ICD-10-CM

## 2011-06-07 DIAGNOSIS — R232 Flushing: Secondary | ICD-10-CM

## 2011-06-07 MED ORDER — VENLAFAXINE HCL 37.5 MG PO TABS: 37.5000 mg | ORAL_TABLET | Freq: Every day | ORAL | Status: DC

## 2011-06-07 NOTE — Telephone Encounter (Signed)
Dysphagia is better and she has not had any choking episodes since I saw her in follow-up.  The patient was switched to Zoloft for hot flashes secondary to aromatase inhibitor.  Since starting the Zoloft she has been emotional.  She has been crying without any initiating factors, in fact, she went to work last week and cried the entire shift, she has been up crying at night.  So we will switch her back to Effexor.  She is ok to take Benadryl at night to help her sleep.  She denies anything new happening in her personal life that may be attributing to her emotions.    I will call in Effexor to her pharmacy.    Sharmane Dame

## 2011-08-20 ENCOUNTER — Other Ambulatory Visit (HOSPITAL_COMMUNITY): Payer: Self-pay | Admitting: Urology

## 2011-08-20 ENCOUNTER — Other Ambulatory Visit (HOSPITAL_COMMUNITY): Payer: Self-pay | Admitting: Oncology

## 2011-08-20 DIAGNOSIS — R232 Flushing: Secondary | ICD-10-CM

## 2011-08-20 DIAGNOSIS — D49519 Neoplasm of unspecified behavior of unspecified kidney: Secondary | ICD-10-CM

## 2011-08-20 DIAGNOSIS — C50919 Malignant neoplasm of unspecified site of unspecified female breast: Secondary | ICD-10-CM

## 2011-08-20 MED ORDER — VENLAFAXINE HCL 37.5 MG PO TABS: 37.5000 mg | ORAL_TABLET | Freq: Every day | ORAL | Status: DC

## 2011-08-21 ENCOUNTER — Ambulatory Visit (INDEPENDENT_AMBULATORY_CARE_PROVIDER_SITE_OTHER): Payer: BC Managed Care – PPO | Admitting: General Surgery

## 2011-08-21 ENCOUNTER — Encounter (INDEPENDENT_AMBULATORY_CARE_PROVIDER_SITE_OTHER): Payer: Self-pay | Admitting: General Surgery

## 2011-08-21 VITALS — BP 138/82 | HR 88 | Temp 97.3°F | Resp 12 | Ht 70.0 in | Wt 298.8 lb

## 2011-08-21 DIAGNOSIS — C50919 Malignant neoplasm of unspecified site of unspecified female breast: Secondary | ICD-10-CM

## 2011-08-21 DIAGNOSIS — C50911 Malignant neoplasm of unspecified site of right female breast: Secondary | ICD-10-CM

## 2011-08-21 NOTE — Progress Notes (Signed)
HISTORY: Pt is s/p BCT for right stage III breast cancer in accessory breast tissue in right axilla.  She got IV chemotherapy.  She is on arimedex at the direction of Dr. Mariel Sleet.  She has significant hot flashes with her antiestrogen therapy.  She has no new health problems. She is complaining of severe burning pain and firmness at the original breast cancer scar in the axilla.  She states that sometimes when she hits it, she has such severe pain that it brings her to tears.     PERTINENT REVIEW OF SYSTEMS: Otherwise negative.     EXAM: Head: Normocephalic and atraumatic.  Eyes:  Conjunctivae are normal. Pupils are equal, round, and reactive to light. No scleral icterus.  Neck:  Normal range of motion. Neck supple. No tracheal deviation present. No thyromegaly present.  Resp: No respiratory distress, normal effort. Breast:  Bilateral breast reduction scars.  No masses in the breast.  No nipple discharge.  No axillary lymphadenopathy.  Right axillary scar with rubbery nodularity.  No discrete mass palpable.   Abd:  Abdomen is soft, non distended and non tender. No masses are palpable.  There is no rebound and no guarding.  Neurological: Alert and oriented to person, place, and time. Coordination normal.  Skin: Skin is warm and dry. No rash noted. No diaphoretic. No erythema. No pallor.  Extremities:  No edema.  Significant varicose veins.   Psychiatric: Normal mood and affect. Normal behavior. Judgment and thought content normal.      ASSESSMENT AND PLAN:   Infiltrating ductal carcinoma of breast Get diagnostic mammogram/ultrasound of right axilla. Pt due for mammogram anyway. Area in axilla does not feel hard like cancer, but is slightly rubbery. Given symptoms in axilla, do have reasonable concern for recurrence. Pt also had more aggressive type of breast cancer from accessory breast tissue.  Will determine follow up after mammogram. Otherwise follow up in 3 months.         Maudry Diego, MD Surgical Oncology, General & Endocrine Surgery [facility]  Colette Ribas, MD Colette Ribas, MD

## 2011-08-21 NOTE — Assessment & Plan Note (Signed)
Get diagnostic mammogram/ultrasound of right axilla. Pt due for mammogram anyway. Area in axilla does not feel hard like cancer, but is slightly rubbery. Given symptoms in axilla, do have reasonable concern for recurrence. Pt also had more aggressive type of breast cancer from accessory breast tissue.  Will determine follow up after mammogram. Otherwise follow up in 3 months.

## 2011-08-21 NOTE — Patient Instructions (Signed)
Even if mammogram/ultrasound is negative, will follow up in 3 months. If positive, will determine what type of biopsy to perform.

## 2011-08-27 ENCOUNTER — Other Ambulatory Visit (INDEPENDENT_AMBULATORY_CARE_PROVIDER_SITE_OTHER): Payer: Self-pay | Admitting: General Surgery

## 2011-08-27 ENCOUNTER — Ambulatory Visit
Admission: RE | Admit: 2011-08-27 | Discharge: 2011-08-27 | Disposition: A | Discharge status: Home / Self Care | Payer: BC Managed Care – PPO | Source: Ambulatory Visit | Attending: General Surgery | Admitting: General Surgery

## 2011-08-27 ENCOUNTER — Telehealth (INDEPENDENT_AMBULATORY_CARE_PROVIDER_SITE_OTHER): Payer: Self-pay | Admitting: General Surgery

## 2011-08-27 DIAGNOSIS — C50911 Malignant neoplasm of unspecified site of right female breast: Secondary | ICD-10-CM

## 2011-08-27 NOTE — Telephone Encounter (Signed)
Spoke with pt and informed her that her mammogram and ultrasound didn't show anything of concern.  She was pleased with this.

## 2011-08-27 NOTE — Telephone Encounter (Signed)
Message copied by Littie Deeds on Mon Aug 27, 2011  1:35 PM ------      Message from: Almond Lint      Created: Mon Aug 27, 2011 10:49 AM       Please let pt know that mammo/ultrasound is negative for concerning lesions.

## 2011-08-28 ENCOUNTER — Telehealth (INDEPENDENT_AMBULATORY_CARE_PROVIDER_SITE_OTHER): Payer: Self-pay

## 2011-08-28 ENCOUNTER — Other Ambulatory Visit: Payer: BC Managed Care – PPO

## 2011-08-28 NOTE — Telephone Encounter (Signed)
Notified pt of normal [place] results.

## 2011-09-06 ENCOUNTER — Other Ambulatory Visit (HOSPITAL_COMMUNITY): Payer: Self-pay | Admitting: Oncology

## 2011-09-06 DIAGNOSIS — Z139 Encounter for screening, unspecified: Secondary | ICD-10-CM

## 2011-09-06 DIAGNOSIS — Z09 Encounter for follow-up examination after completed treatment for conditions other than malignant neoplasm: Secondary | ICD-10-CM

## 2011-09-18 ENCOUNTER — Other Ambulatory Visit (HOSPITAL_COMMUNITY): Payer: Self-pay | Admitting: Oncology

## 2011-09-25 ENCOUNTER — Ambulatory Visit (HOSPITAL_COMMUNITY)
Admission: RE | Admit: 2011-09-25 | Discharge: 2011-09-25 | Disposition: A | Discharge status: Home / Self Care | Payer: BC Managed Care – PPO | Source: Ambulatory Visit | Attending: Urology | Admitting: Urology

## 2011-09-25 DIAGNOSIS — K7689 Other specified diseases of liver: Secondary | ICD-10-CM | POA: Insufficient documentation

## 2011-09-25 DIAGNOSIS — N281 Cyst of kidney, acquired: Secondary | ICD-10-CM | POA: Insufficient documentation

## 2011-09-25 DIAGNOSIS — D49519 Neoplasm of unspecified behavior of unspecified kidney: Secondary | ICD-10-CM

## 2011-09-25 MED ORDER — GADOBENATE DIMEGLUMINE 529 MG/ML IV SOLN
20.0000 mL | Freq: Once | INTRAVENOUS | Status: AC | PRN
  Administered 2011-09-25: 20 mL via INTRAVENOUS

## 2011-09-26 LAB — POCT I-STAT, CHEM 8
Calcium, Ion: 1.25 mmol/L — ABNORMAL HIGH (ref 1.12–1.23)
Creatinine, Ser: 0.8 mg/dL (ref 0.50–1.10)
Glucose, Bld: 195 mg/dL — ABNORMAL HIGH (ref 70–99)
HCT: 39 % (ref 36.0–46.0)
Hemoglobin: 13.3 g/dL (ref 12.0–15.0)

## 2011-10-03 ENCOUNTER — Ambulatory Visit (HOSPITAL_COMMUNITY)
Admission: RE | Admit: 2011-10-03 | Discharge: 2011-10-03 | Disposition: A | Discharge status: Home / Self Care | Payer: BC Managed Care – PPO | Source: Ambulatory Visit | Attending: Oncology | Admitting: Oncology

## 2011-10-03 DIAGNOSIS — Z853 Personal history of malignant neoplasm of breast: Secondary | ICD-10-CM | POA: Insufficient documentation

## 2011-10-03 DIAGNOSIS — Z09 Encounter for follow-up examination after completed treatment for conditions other than malignant neoplasm: Secondary | ICD-10-CM

## 2011-10-18 ENCOUNTER — Other Ambulatory Visit (HOSPITAL_COMMUNITY): Payer: Self-pay | Admitting: Oncology

## 2011-10-18 DIAGNOSIS — R232 Flushing: Secondary | ICD-10-CM

## 2011-10-18 DIAGNOSIS — C50919 Malignant neoplasm of unspecified site of unspecified female breast: Secondary | ICD-10-CM

## 2011-10-18 MED ORDER — VENLAFAXINE HCL 37.5 MG PO TABS: 37.5000 mg | ORAL_TABLET | Freq: Every day | ORAL | Status: DC

## 2011-11-07 ENCOUNTER — Encounter (INDEPENDENT_AMBULATORY_CARE_PROVIDER_SITE_OTHER): Payer: Self-pay | Admitting: General Surgery

## 2011-11-19 ENCOUNTER — Encounter (HOSPITAL_COMMUNITY): Payer: Self-pay | Admitting: Oncology

## 2011-11-19 ENCOUNTER — Telehealth (HOSPITAL_COMMUNITY): Payer: Self-pay | Admitting: Dietician

## 2011-11-19 ENCOUNTER — Encounter (HOSPITAL_COMMUNITY): Payer: BC Managed Care – PPO | Attending: Oncology | Admitting: Oncology

## 2011-11-19 VITALS — BP 134/82 | HR 105 | Temp 98.7°F | Resp 20 | Wt 299.8 lb

## 2011-11-19 DIAGNOSIS — E669 Obesity, unspecified: Secondary | ICD-10-CM | POA: Insufficient documentation

## 2011-11-19 DIAGNOSIS — C50919 Malignant neoplasm of unspecified site of unspecified female breast: Secondary | ICD-10-CM

## 2011-11-19 DIAGNOSIS — C50619 Malignant neoplasm of axillary tail of unspecified female breast: Secondary | ICD-10-CM

## 2011-11-19 DIAGNOSIS — Z09 Encounter for follow-up examination after completed treatment for conditions other than malignant neoplasm: Secondary | ICD-10-CM | POA: Insufficient documentation

## 2011-11-19 DIAGNOSIS — K7689 Other specified diseases of liver: Secondary | ICD-10-CM | POA: Insufficient documentation

## 2011-11-19 DIAGNOSIS — E119 Type 2 diabetes mellitus without complications: Secondary | ICD-10-CM | POA: Insufficient documentation

## 2011-11-19 DIAGNOSIS — Z853 Personal history of malignant neoplasm of breast: Secondary | ICD-10-CM | POA: Insufficient documentation

## 2011-11-19 DIAGNOSIS — C773 Secondary and unspecified malignant neoplasm of axilla and upper limb lymph nodes: Secondary | ICD-10-CM

## 2011-11-19 DIAGNOSIS — E039 Hypothyroidism, unspecified: Secondary | ICD-10-CM | POA: Insufficient documentation

## 2011-11-19 LAB — COMPREHENSIVE METABOLIC PANEL
AST: 42 U/L — ABNORMAL HIGH (ref 0–37)
Albumin: 4.3 g/dL (ref 3.5–5.2)
BUN: 19 mg/dL (ref 6–23)
Chloride: 100 mEq/L (ref 96–112)
Creatinine, Ser: 0.74 mg/dL (ref 0.50–1.10)
Potassium: 4.4 mEq/L (ref 3.5–5.1)
Total Bilirubin: 0.8 mg/dL (ref 0.3–1.2)
Total Protein: 8 g/dL (ref 6.0–8.3)

## 2011-11-19 LAB — CBC WITH DIFFERENTIAL/PLATELET
Basophils Absolute: 0 10*3/uL (ref 0.0–0.1)
Basophils Relative: 0 % (ref 0–1)
Eosinophils Absolute: 0.3 10*3/uL (ref 0.0–0.7)
MCH: 28.2 pg (ref 26.0–34.0)
MCHC: 33.1 g/dL (ref 30.0–36.0)
Monocytes Absolute: 0.5 10*3/uL (ref 0.1–1.0)
Monocytes Relative: 4 % (ref 3–12)
Neutro Abs: 8.3 10*3/uL — ABNORMAL HIGH (ref 1.7–7.7)
Neutrophils Relative %: 74 % (ref 43–77)
RDW: 14.2 % (ref 11.5–15.5)

## 2011-11-19 NOTE — Telephone Encounter (Signed)
Appointment scheduled for 11/26/11 at 12:15 PM.

## 2011-11-19 NOTE — Progress Notes (Signed)
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 This was most likely an axillary tail of Sp[place] tumor with a 1.9 cm mass in 3 of 26 positive nodes with extracapsular extension. She is status post surgical resection on 01/25/2009 lymph node dissection on 04/23/2009 followed by reexcision on 03/16/2009 clear all margins. Her estrogen receptors were 62% positive, progesterone receptors were 36% positive and Ki-67 marker was 33%. HER-2/neu was nonamplified. She was treated with epirubicin and Cytoxan in a dose dense fashion x4 cycles 5 and weekly Taxol x12 weeks. She then went on to have radiation therapy and started Arimidex on 12/22/2009. Thus far she is without evidence of recurrent disease. Problem #2 massive obesity Problem #3 hot flashes secondary to having a hysterectomy and to taking Arimidex. She has had bilateral oophorectomies for a complex ovarian cyst by Dr. Emelda Fear at the beginning of her breast cancer therapy to make sure she does not have concomitant ovarian cancer or metastasis. 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 Laverle Patter though he has released her. MRI of her abdomen showed no suspicious kidney lesions that were worrisome for cancer. Problem #9 steatohepatitis The MRI of her abdomen in September absolutely showed fatty infiltration which they felt was severe. She did not have splenomegaly. She does not watch her diet like a diabetic should. I have discussed with her today bariatric surgery some point if she cannot lose weight and I suspect she will not lose weight well enough to help her liver issue. Nevertheless she wants to try and we will make the appointment with her diabetic nutritionist. Her oncologic review of systems otherwise is  noncontributory. Vital signs are recorded BP 134/82  Pulse 105  Temp 98.7 F (37.1 C) (Oral)  Resp 20  Wt 299 lb 12.8 oz (135.988 kg) she is in no acute distress. Her lowest weight we have recorded in the past was 280 pounds when we initiated paclitaxel therapy. Lymph nodes remain negative and the cervical, subclavicular, infraclavicular, axillary and inguinal areas. She has no clear-cut arm or leg edema. Lungs are clear. Heart shows a regular rhythm and rate without murmur rub or gallop. Both breasts show prior surgery but no masses that I can appreciate. Abdomen remains massively obese without obvious organomegaly. She is alert and oriented.  We'll see her in 6 months. Baseline lab work is pending from today. I've encouraged her to discuss with her mother with whom she is very close bariatric surgery at some point in the future. She states that her mother has wanted her to consider this for years. She will continue the Arimidex for total 5 years

## 2011-11-19 NOTE — Patient Instructions (Signed)
[facility]  Discharge Instructions  RECOMMENDATIONS MADE BY THE CONSULTANT AND ANY TEST RESULTS WILL BE SENT TO YOUR REFERRING DOCTOR.   EXAM FINDINGS BY MD TODAY AND SIGNS AND SYMPTOMS TO REPORT TO CLINIC OR PRIMARY MD: Exam findings as discussed by Dr. Mariel Sleet.  SPECIAL INSTRUCTIONS/FOLLOW-UP: 1.  You had labs today and we will contact you with the results.   2.  We have asked for you to have a nutrition consult for eval of weight loss.  The dietician, Altamese Cabal, will be contacting you to set up a date/time. 3.  Please keep your appointment to see T. Kefalas, PA-C in 6 months as scheduled.  I acknowledge that I have been informed and understand all the instructions given to me and received a copy. I do not have any more questions at this time, but understand that I may call the Specialty Clinic at [facility] at [phone] during business hours should I have any further questions or need assistance in obtaining follow-up care.    __________________________________________  _____________  __________ Signature of Patient or Authorized Representative            Date                   Time    __________________________________________ Nurse's Signature

## 2011-11-19 NOTE — Telephone Encounter (Signed)
Received referral via inbox message from [facility] for dx: obesity, diabetes.

## 2011-11-21 NOTE — Telephone Encounter (Signed)
Mailed appointment confirmation letter and instructions for appointment via US Mail.  

## 2011-11-26 ENCOUNTER — Encounter (HOSPITAL_COMMUNITY): Payer: Self-pay | Admitting: Dietician

## 2011-11-26 NOTE — Progress Notes (Signed)
Outpatient Initial Nutrition Assessment  Date:11/26/2011   Appt Start Time: 1214  Referring Physician: Dr. Mariel Sleet Reason for Visit: obesity, diabetes  Pt reports PCP is Dr. Clelia Croft in [place]  Nutrition Assessment:  Height: 5\' 10"  (177.8 cm)   Weight: 299 lb (135.626 kg)   IBW: 160# %IBW: 187% UBW: 320# %UBW: 93% Body mass index is 42.90 kg/(m^2). Classified as extreme obesity, class III.  Goal Weight: 269# (10% weight loss of current wt) Weight hx: Pt reports her lowest adult weight was 270#, which she achieve in 2010 while undergoing chemotherapy. She reports weight loss was not due to lifestyle modifications, but due to decreased appetite and side effects from cancer treatments. She reports UBW of 315-320#.   Estimated nutritional needs: 2280-2488 kcals daily, 109-136 grams protein daily, 2.3-2.5 L fluid daily  PMH:  Past Medical History  Diagnosis Date  . Diabetes mellitus   . Thyroid disease   . DM (diabetes mellitus) 11/20/2010  . Hot flashes secondary to Arimidex 11/20/2010  . Breast cancer     right  . Hypothyroidism 11/20/2010    Synthroid  . Edema     hands and feet at times, takes as a direutic  . Depression     wellburtin  . Arthritis     Hands and legs  . Anxiety     Xanax    Medications:  Current Outpatient Rx  Name  Route  Sig  Dispense  Refill  . ACCU-CHEK FASTCLIX LANCETS MISC               . ACCU-CHEK SMARTVIEW VI STRP               . ALPRAZOLAM 0.25 MG PO TABS   Oral   Take 0.25 mg by mouth 3 (three) times daily as needed.         . ANASTROZOLE 1 MG PO TABS      TAKE 1 TABLET ONCE DAILY.   30 tablet   3   . CEFOL PO TABS   Oral   Take 1 tablet by mouth daily.           . BD PEN NEEDLE MINI U/F 31G X 5 MM MISC               . BUPROPION HCL ER (SR) 200 MG PO TB12   Oral   Take 200 mg by mouth daily.          Marland Kitchen CALCIUM CARBONATE 600 MG PO TABS   Oral   Take 1,200 mg by mouth daily.          Marland Kitchen VITAMIN D 1000  UNITS PO TABS   Oral   Take 1,000 Units by mouth daily.           Marland Kitchen DOCUSATE SODIUM 100 MG PO CAPS   Oral   Take 100 mg by mouth as needed.          Marland Kitchen HYDROCHLOROTHIAZIDE 25 MG PO TABS   Oral   Take 25 mg by mouth as needed.          . IBUPROFEN 800 MG PO TABS   Oral   Take 800 mg by mouth every 8 (eight) hours as needed.           . INSULIN ASPART 100 UNIT/ML  SOLN   Subcutaneous   Inject into the skin 3 (three) times daily before meals. Sliding scale         . INSULIN GLARGINE  100 UNIT/ML [place] SOLN   Subcutaneous   Inject 42 Units into the skin daily.          . IRON PO   Oral   Take 1 tablet by mouth daily. OTC         . LEVOTHYROXINE SODIUM 50 MCG PO TABS   Oral   Take 50 mcg by mouth daily.           Marland Kitchen LIRAGLUTIDE 18 MG/3ML [place] SOLN   Subcutaneous   Inject 1.8 mLs into the skin daily.          Marland Kitchen METFORMIN HCL 1000 MG PO TABS   Oral   Take 1,000 mg by mouth daily with breakfast.          . WOMENS DAILY FORMULA PO   Oral   Take by mouth daily.         . VENLAFAXINE HCL 37.5 MG PO TABS   Oral   Take 1 tablet (37.5 mg total) by mouth daily.   30 tablet   3   . VITAMIN E 1000 UNITS PO CAPS   Oral   Take 1,000 Units by mouth daily.         Marland Kitchen ZOLPIDEM TARTRATE 10 MG PO TABS   Oral   Take 10 mg by mouth at bedtime as needed.             Labs: CMP     Component Value Date/Time   NA 140 11/19/2011 1225   K 4.4 11/19/2011 1225   CL 100 11/19/2011 1225   CO2 28 11/19/2011 1225   GLUCOSE 101* 11/19/2011 1225   BUN 19 11/19/2011 1225   CREATININE 0.74 11/19/2011 1225   CALCIUM 10.9* 11/19/2011 1225   PROT 8.0 11/19/2011 1225   ALBUMIN 4.3 11/19/2011 1225   AST 42* 11/19/2011 1225   ALT 41* 11/19/2011 1225   ALKPHOS 103 11/19/2011 1225   BILITOT 0.8 11/19/2011 1225   GFRNONAA >90 11/19/2011 1225   GFRAA >90 11/19/2011 1225    Lipid Panel     Component Value Date/Time   CHOL 178 11/20/2010 1431   TRIG 200* 11/20/2010  1431   HDL 44 11/20/2010 1431   CHOLHDL 4.0 11/20/2010 1431   VLDL 40 11/20/2010 1431   LDLCALC 94 11/20/2010 1431     Lab Results  Component Value Date   HGBA1C  Value: 7.4 (NOTE)                                                                       According to the ADA Clinical Practice Recommendations for 2011, when HbA1c is used as a screening test:   >=6.5%   Diagnostic of Diabetes Mellitus           (if abnormal result  is confirmed)  5.7-6.4%   Increased risk of developing Diabetes Mellitus  References:Diagnosis and Classification of Diabetes Mellitus,Diabetes Care,2011,34(Suppl 1):S62-S69 and Standards of Medical Care in         Diabetes - 2011,Diabetes Care,2011,34  (Suppl 1):S11-S61.* 04/20/2009   Lab Results  Component Value Date   LDLCALC 94 11/20/2010   CREATININE 0.74 11/19/2011    Pt reports last Hgb A1c was taken between 8/13 and 9/13 and  reports it was "7 something".   Lifestyle/ social habits: Ms. Noreen lives in [place] with her 62 year old daughter, 28 year old son, and mother. She works full time as a Arboriculturist for [employer]. She does not currently participate in physical activity outside of her job. She reports that she is too tired to exercise because of the physical nature of her job. She reports stress level of a 7-8/10. She reports "dieting" including self care, taking medications, compliance with diabetes self-management practices, reading food labels, and weight loss efforts frustrate her because they are "too complicated". Initially, pt was very guarded at visit (she provided limited eye contact and had negative body language, sitting with her arms crossed). She was also very resistant to follow any recommendations. Upon further questioning, pt became very tearful and emotional. She reports that due to limited literacy skills, it takes her a very long time to read food labels and all the nutrition information she has received over the years has been  very cumbersome. She reported to me that she does not feel like she will ever lose weight, especially because her past efforts have failed. She also expressed concern over her PMH and family hx, as her mother, father, and her own children either have diabetes and heart disease, or have several risk factors, such as impaired glucose metabolism and obesity.   Nutrition hx/habits: Ms. Zylka reports that she has tried to lose weight multiple times, without any long term success or maintenance. She has tried diet programs in the past, such as Weight Watchers and another program that she cannot recall the name of ("Life Something"), which she reported consisted of diet and Bible Verses. She reports she does not check her glucose levels as she should, checking 2 times a day instead of 4. She reports fasting glucose of 140 this AM. She eats out 2-3 times per day, mostly at fast food establishments. She drinks mostly water, diet soda, and sugar-free drink mixes.   Diet recall: Could not get a full 24 hour recall on this pt. She reports that she ate grilled chicken, mac and cheese, and green beans for lunch yesterday. She frequents fast food establishments as often as 3 times daily. She eats 2 snacks daily, which usually consist of a pack of crackers.   Nutrition Diagnosis: Excessive energy intake r/t physical inactivity, high calorie food choices AEB diet recall, BMI: 42.90.   Nutrition Intervention: Nutrition rx: 1800 kcal NAS, no added sugar diet; 3 meals per day; no snacks; low calorie beverages only; physical activity as tolerated  Education/Counseling Provided: Educated pt on principles of weight management and diabetic diet. Discussed importance of compliance to prevent further complications of disease. Discussed principles of energy expenditure and how changes in diet and physical activity affect weight status.Discussed carbohydrate metabolism in relation to diabetes. Educated pt on plate method, portion  sizes, and sources of carbohydrate. Discussed importance of regular meal pattern. Discussed nutritional content of commonly eaten foods and suggested healthier alternatives. Educated pt on plate method and a general, healthful diet that includes low fat dairy, lean meats, whole fruits and vegetables, and whole grains most often. Educated on food label reading. Discussed importance of a healthy diet along with regular physical activity (at least 30 minutes 5 times per week) to achieve weight loss and glycemic goals. Encouraged slow, moderate weight loss (0.5-2# weight loss per week or 7-10% of current body weight) and adopting healthy lifestyle changes vs. obtaining a certain body type or  weight. Discussed importance of compliance to prevent further complications of disease. Provided plate method handout and AND handout ("Food Label Reading For Weight Management"). Used TeachBack to assess understanding.  Understanding, Motivation, Ability to Follow Recommendations: Expect fair compliance.   Monitoring and Evaluation: Goals: 1) 1-2# weight loss per week; 2) Hgb A1c < 7.0; 3) Physical activity as tolerated  Recommendations: 1) for weight loss: 1780-1988 kcals daily; 2) Break up physical activity into smaller sessions- start slow; 3) Choose smallest size entree at restaurants (or kid sized/ dollar menu item or only eat half of entree and bag the remaining portion for another day); 4) Pay attention to serving size on food label; 5) Keep food diary  F/U: PRN. Provided RD contact information. Pt reports she may like to follow-up after the holidays.   Melody Haver, RD, LDN 11/26/2011  Appt EndTime: 1336

## 2011-12-15 IMAGING — CT CT CHEST W/ CM
2 of 5 series · 12 of 36 positions shown, 18 images · IV contrast (READICAT/WATER & [ID] OMNI 300)
Comparison: None

CT CHEST

CLINICAL DATA: Right breast cancer.

CT CHEST, ABDOMEN AND PELVIS WITH CONTRAST
TECHNIQUE: Multidetector CT imaging of the chest, abdomen and
pelvis was performed following the standard protocol during bolus
administration of intravenous contrast.
Contrast: 100 ml of Zmnipaque-VBB

[Series 601: coronal body · coronal · 1.35mm/px · 1 of 152 slices shown, 2 images]
[im 51/152  soft-tissue]
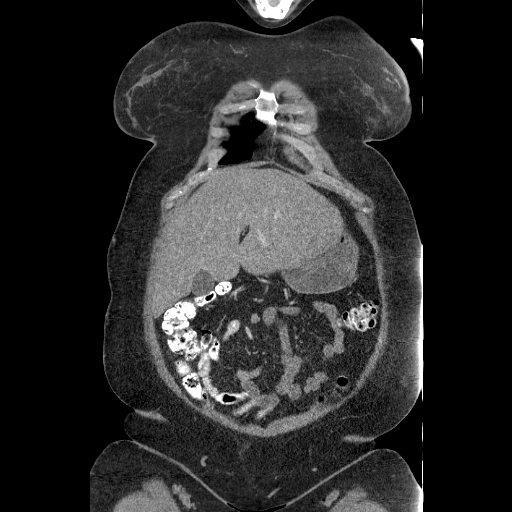
[im 51/152  bone]
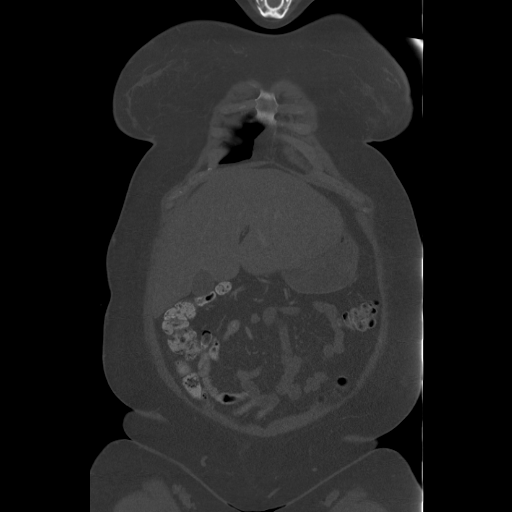

[Series 602: sagittal body · sagittal · 1.35mm/px · 11 of 193 slices shown, 16 images]
[im 14/193  soft-tissue]
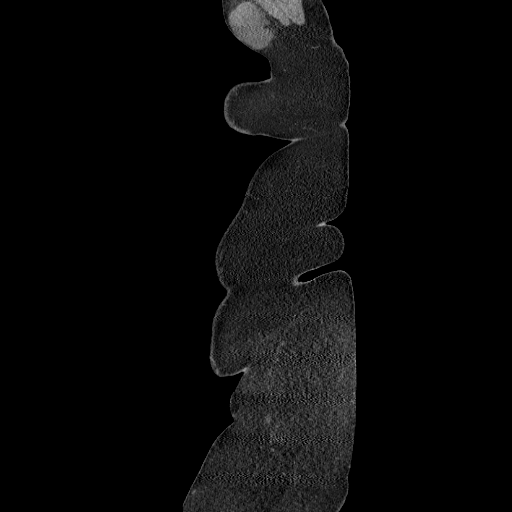
[im 14/193  lung]
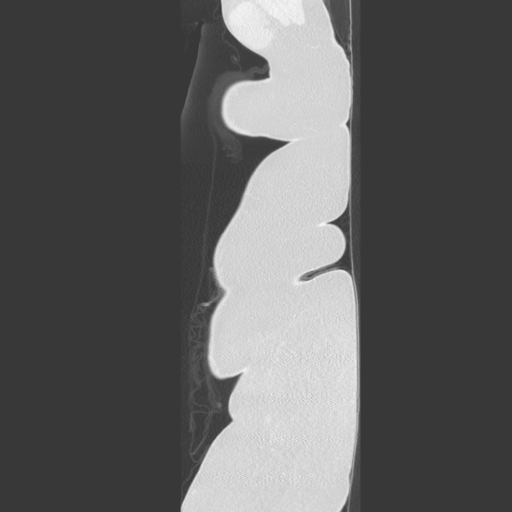
[im 14/193  bone]
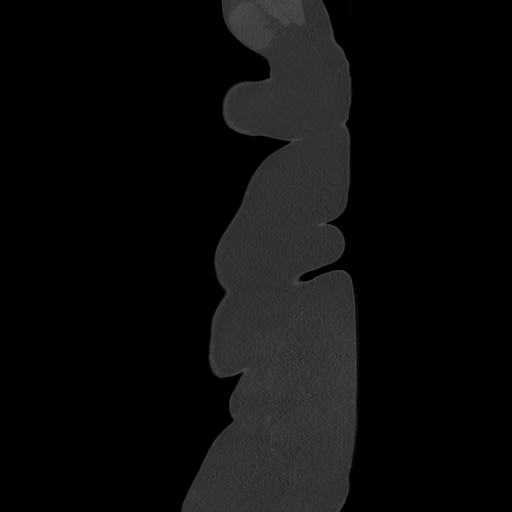
[im 28/193  soft-tissue]
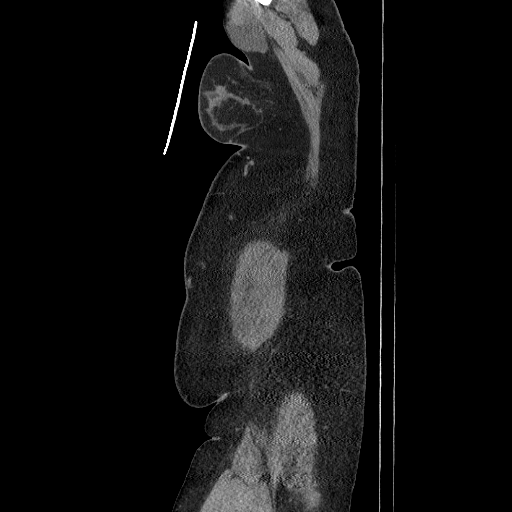
[im 28/193  lung]
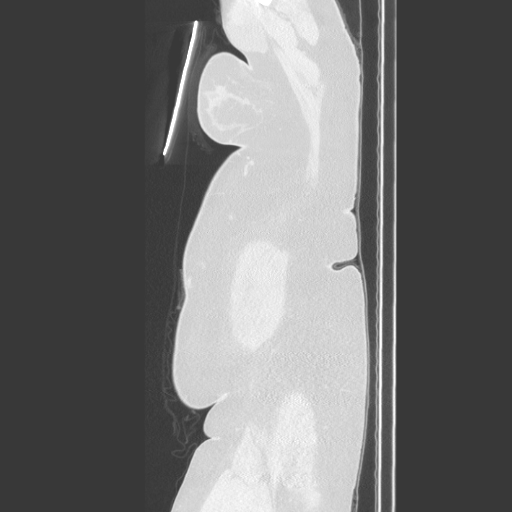
[im 42/193  lung]
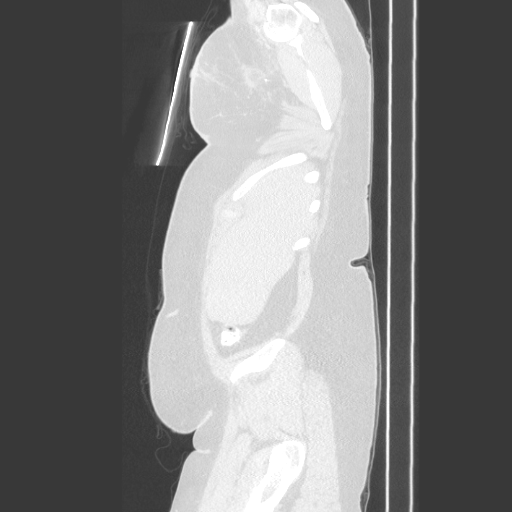
[im 55/193  soft-tissue]
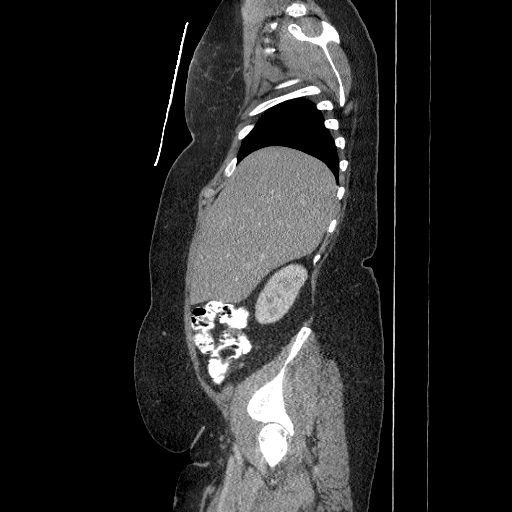
[im 55/193  lung]
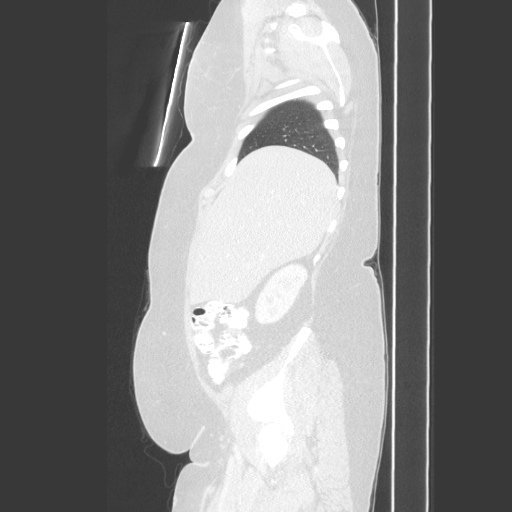
[im 69/193  soft-tissue]
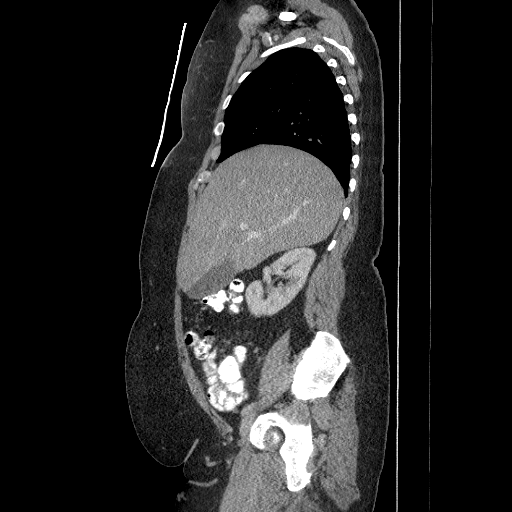
[im 83/193  soft-tissue]
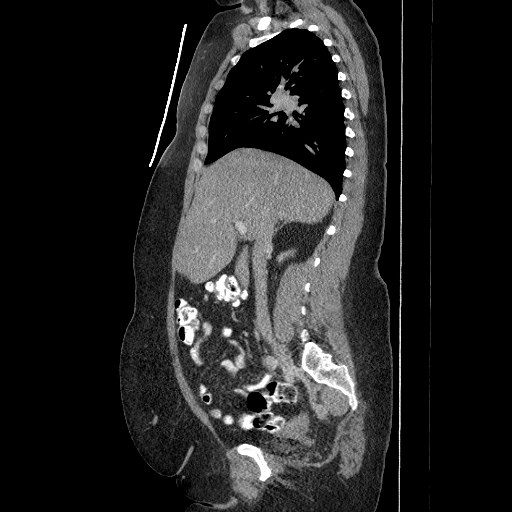
[im 110/193  soft-tissue]
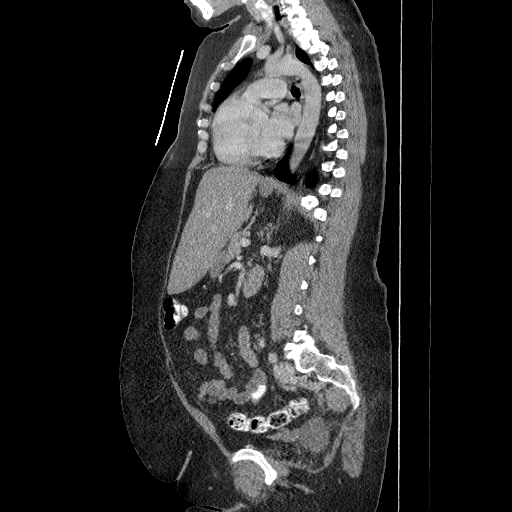
[im 124/193  soft-tissue]
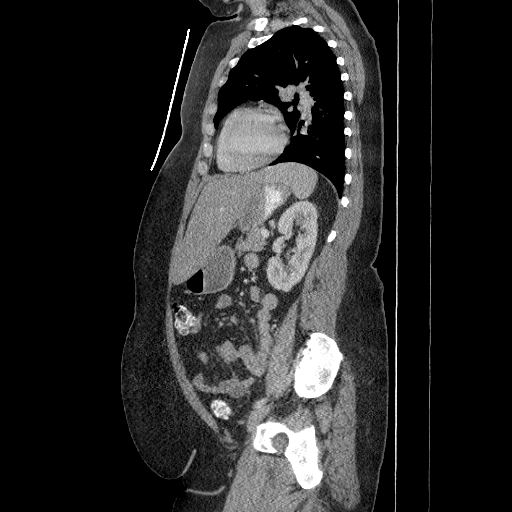
[im 138/193  soft-tissue]
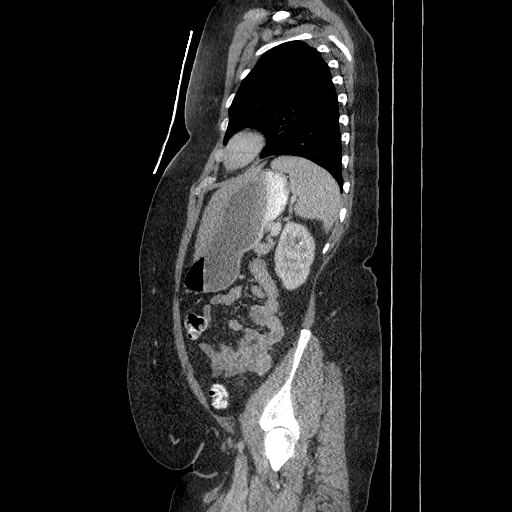
[im 165/193  soft-tissue]
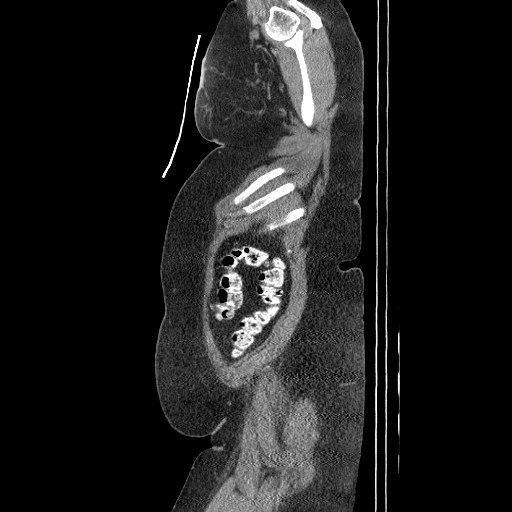
[im 165/193  bone]
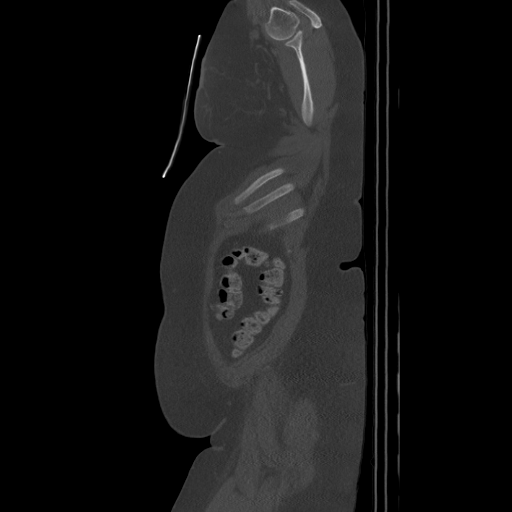
[im 179/193  soft-tissue]
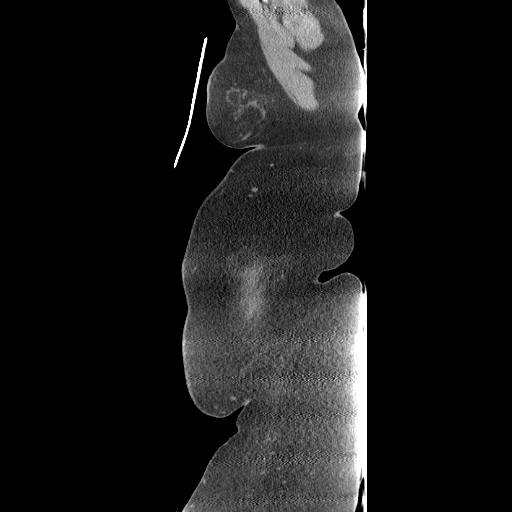

[12 of 36 positions shown; findings below may reference images not displayed]

FINDINGS: The chest wall demonstrates surgical changes involving
the right breast and in the sella.  There is a large postop fluid
collection in the high right axilla.  No supraclavicular or left
axillary adenopathy.

The heart is normal in size.  No pericardial effusion.  No
mediastinal or hilar adenopathy.  The esophagus is grossly normal.

Examination of the lung parenchyma demonstrates too small pulmonary
nodules in the right lung.  One is on image number 29 near the
minor fissure and the other is in a subpleural location in the
superior segment right lower lobe on image number 31.  There is a
tiny nodule in the left upper lobe on image number 33 and a small
subpleural nodule in the superior segment left lower lobe on image
number 34.  These are indeterminate findings.  They may represent
benign noncalcified granulomas or intrapulmonary lymph nodes but
metastatic disease cannot be excluded I would recommend a follow-up
noncontrast chest CT in 4-6 months.

The bony thorax is intact.  Cervical fusion hardware is noted.  No
findings to suggest metastatic bone disease.  Small calcified
thoracic disc protrusions are noted.
IMPRESSION: 1.  Postoperative changes involving the right breast and axilla
with a large postoperative fluid collection.
2.  Small pulmonary nodules are an indeterminate finding and
requires followup as above.
3.  No mediastinal or hilar adenopathy.
4.  No findings to suggest metastatic bone disease.

CT ABDOMEN AND PELVIS
FINDINGS: There is diffuse fatty infiltration of the liver.  No
focal hepatic lesions.  No biliary dilatation.  The gallbladder
appears normal.  The spleen, pancreas, adrenal glands and kidneys
are unremarkable.

The stomach, duodenum, small bowel and colon demonstrate no
significant findings.  No mesenteric mass or adenopathy.  The
appendix is normal.  Mild diverticulosis of the sigmoid colon.

There are surgical changes from hysterectomy.  The right ovary
appears normal.  The left ovary is slightly enlarged due to a
cm cyst.  Given its size I would recommend a follow-up pelvic
ultrasound in 4-6 months to reevaluate.  No pelvic adenopathy.  No
free pelvic fluid collections.  The bony pelvis is intact.  No
inguinal mass or hernia.
IMPRESSION: 1.  No CT findings for metastatic disease involving the abdomen or
pelvis.
2.  Diffuse fatty infiltration of the liver.
3.  5.6 cm cystic structure associated the left ovary.  Recommend
follow-up pelvic ultrasound in 4-6 months to reevaluate.

## 2012-01-09 DIAGNOSIS — I8390 Asymptomatic varicose veins of unspecified lower extremity: Secondary | ICD-10-CM

## 2012-01-09 HISTORY — DX: Asymptomatic varicose veins of unspecified lower extremity: I83.90

## 2012-02-15 ENCOUNTER — Other Ambulatory Visit (HOSPITAL_COMMUNITY): Payer: Self-pay | Admitting: Oncology

## 2012-05-17 NOTE — Progress Notes (Signed)
Kelly Ribas, MD [address] [place] [address]  Infiltrating ductal carcinoma of breast, right - Plan: CBC with Differential, Comprehensive metabolic panel, CBC with Differential, Comprehensive metabolic panel  CURRENT THERAPY: On Arimidex daily starting on 12/22/2009 and she will take for 5 years  INTERVAL HISTORY: Kelly Jacobson 52 y.o. 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 This was most likely an axillary tail of Spence tumor with a 1.9 cm mass with 3 of 26 positive nodes with extracapsular extension. She is status post surgical resection on 01/25/2009, lymph node dissection on 04/23/2009, followed by reexcision on 03/16/2009 with clear margins. Her estrogen receptors were 62% positive, progesterone receptors were 36% positive, and Ki-67 marker was 33%. HER-2/neu was nonamplified. She was treated with epirubicin and Cytoxan in a dose dense fashion x4 cycles and then weekly Taxol x12 weeks. She then went on to have radiation therapy and started Arimidex on 12/22/2009. Thus far she is without evidence of recurrent disease.   Kelly Jacobson is doing well.  Both of her children are graduating high school this year.  Both are going to go to [employer].  She is tolerating Arimidex well.  Sh requests a referral to [facility] for a second opinion regarding her varicose veins.  She provides me with the name Kelly Jacobson and telephone number [phone] and fax number [phone].  I can set that up, but when I contacted Kelly Jacobson, she reports that Kelly Jacobson is already an established patient with them.    She reports that she has been to 2 bariatric meetings and she is not interested in the surgery.  She reports that she is afraid of the intervention.  She reports that she would ike to lose weight on her own.  I have encouraged her to do so.  She is down 4 lbs since  November.  I would like to see her lose 10 lbs in the next 6 months.   Oncologically, she denies any complaints and ROS questioning is negative.     Past Medical History  Diagnosis Date  . Diabetes mellitus   . Thyroid disease   . DM (diabetes mellitus) 11/20/2010  . Hot flashes secondary to Arimidex 11/20/2010  . Breast cancer     right  . Hypothyroidism 11/20/2010    Synthroid  . Edema     hands and feet at times, takes as a direutic  . Depression     wellburtin  . Arthritis     Hands and legs  . Anxiety     Xanax  . Varicose veins 2014    has Infiltrating ductal carcinoma of breast; Hypothyroidism; DM (diabetes mellitus); and Hot flashes secondary to Arimidex on her problem list.     is allergic to darvocet and tape.  Ms. Dehne had no medications administered during this visit.  Past Surgical History  Procedure Laterality Date  . Breast reduction surgery  1991  . Carpal tunnel release  04/06/1998  . Abdominal hysterectomy  2001    vaginal for endometriosis  . Debridement tennis elbow  2003  . Cervical fusion  01/15/2007  . Axillary cyst  01/25/2009  . Axillary node dissection  02/23/2009 and 03/16/2009  . Bilateral oophorectomy  04/19/2009  . Portacath placement  05/02/2009  . Trigger thumb surgery  02/01/2010  . Rectocele surgery  03/07/10  . Port-a-cath removal  01/16/2011    Procedure: REMOVAL PORT-A-CATH;  Surgeon: Kelly Lint, MD;  Location: [facility] OR;  Service: General;  Laterality: N/A;  Removal port-a-cath.  . Carpal tunnel release  [id number]    lt hand    Denies any headaches, dizziness, double vision, fevers, chills, night sweats, nausea, vomiting, diarrhea, constipation, chest pain, heart palpitations, shortness of breath, blood in stool, black tarry stool, urinary pain, urinary burning, urinary frequency, hematuria.   PHYSICAL EXAMINATION  ECOG PERFORMANCE STATUS: 1 - Symptomatic but completely ambulatory  Filed Vitals:   05/19/12 1137  BP: 144/84  Pulse:  99  Temp: 97.8 F (36.6 C)  Resp: 18    GENERAL:alert, no distress, well nourished, well developed, comfortable, cooperative, obese and smiling SKIN: skin color, texture, turgor are normal, no rashes or significant lesions HEAD: Normocephalic, No masses, lesions, tenderness or abnormalities EYES: normal, Conjunctiva are pink and non-injected EARS: External ears normal OROPHARYNX:mucous membranes are moist  NECK: supple, no adenopathy, thyroid normal size, non-tender, without nodularity, no stridor, non-tender, trachea midline LYMPH:  no palpable lymphadenopathy BREAST:breasts appear normal, no suspicious masses, no skin or nipple changes or axillary nodes LUNGS: clear to auscultation and percussion HEART: regular rate & rhythm, no murmurs, no gallops, S1 normal and S2 normal ABDOMEN:abdomen soft, non-tender, obese, normal bowel sounds and difficult to assess for hepatosplenomegaly due to body habitus BACK: Back symmetric, no curvature., No CVA tenderness EXTREMITIES:less then 2 second capillary refill, no joint deformities, effusion, or inflammation, no skin discoloration, no clubbing, no cyanosis, positive findings:  B/L varicosities of LE posteriorly.  NEURO: alert & oriented x 3 with fluent speech, no focal motor/sensory deficits, gait normal    LABORATORY DATA: CBC    Component Value Date/Time   WBC 11.1* 11/19/2011 1225   RBC 4.96 11/19/2011 1225   HGB 14.0 11/19/2011 1225   HCT 42.3 11/19/2011 1225   PLT 325 11/19/2011 1225   MCV 85.3 11/19/2011 1225   MCH 28.2 11/19/2011 1225   MCHC 33.1 11/19/2011 1225   RDW 14.2 11/19/2011 1225   LYMPHSABS 2.1 11/19/2011 1225   MONOABS 0.5 11/19/2011 1225   EOSABS 0.3 11/19/2011 1225   BASOSABS 0.0 11/19/2011 1225      Chemistry      Component Value Date/Time   NA 140 11/19/2011 1225   K 4.4 11/19/2011 1225   CL 100 11/19/2011 1225   CO2 28 11/19/2011 1225   BUN 19 11/19/2011 1225   CREATININE 0.74 11/19/2011 1225       Component Value Date/Time   CALCIUM 10.9* 11/19/2011 1225   ALKPHOS 103 11/19/2011 1225   AST 42* 11/19/2011 1225   ALT 41* 11/19/2011 1225   BILITOT 0.8 11/19/2011 1225       RADIOGRAPHIC STUDIES:  10/03/11  *RADIOLOGY REPORT*  Clinical Data: History of right breast cancer, status post  lumpectomy 2011. Annual examination.  DIGITAL DIAGNOSTIC BILATERAL MAMMOGRAM WITH CAD  Comparison: With priors  Findings: The breast parenchyma is almost entirely fatty replaced.  There are lumpectomy changes on the right. No mass, nonsurgical  distortion, suspicious microcalcification is identified in either  breast to suggest malignancy. Spot magnification views of the  axillary region of the right breast are negative.  Mammographic images were processed with CAD.  IMPRESSION:  No evidence of malignancy in either breast. Surgical changes in  the axillary region on the right.  RECOMMENDATION:  Bilateral diagnostic mammogram in 1 year.  BI-RADS CATEGORY 2: Benign finding(s).  Original Report Authenticated By: Britta Mccreedy, M.D.  ASSESSMENT:  1. 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 This was most likely an axillary tail of Spence tumor with a 1.9 cm mass with 3 of 26 positive nodes with extracapsular extension. She is status post surgical resection on 01/25/2009, lymph node dissection on 04/23/2009, followed by reexcision on 03/16/2009 with clear margins. Her estrogen receptors were 62% positive, progesterone receptors were 36% positive, and Ki-67 marker was 33%. HER-2/neu was nonamplified. She was treated with epirubicin and Cytoxan in a dose dense fashion x4 cycles and then weekly Taxol x12 weeks. She then went on to have radiation therapy and started Arimidex on 12/22/2009. Thus far she is without evidence of recurrent disease.  2. Massive obesity  3. Hot flashes secondary to having a hysterectomy and to taking Arimidex.  She has had bilateral oophorectomies for a complex ovarian cyst by Dr. Emelda Fear at the beginning of her breast cancer therapy to make sure she does not have concomitant ovarian cancer or metastasis.  4. Spinal fusion of her neck in January 2009  5. Diabetes mellitus  6. Hypothyroidism  7. Bilateral reduction mammoplasties in 1991  8. 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 Laverle Patter though he has released her. MRI of her abdomen showed no suspicious kidney lesions that were worrisome for cancer.  9. Steatohepatitis   PLAN:  1. I personally reviewed and went over laboratory results with the patient. 2. I personally reviewed and went over radiographic studies with the patient. 3. Next screening mammogram is due in September of 2014. 4. Labs today: CBC diff, CMET 5. Encourage weight loss.  Would like to see 10 lb weight loss over next 6 months. 6. Refer the patient to [facility] for second opinion regarding her LE varicosities. 7. Return in 6 months for follow-up  All questions were answered. The patient knows to call the clinic with any problems, questions or concerns. We can certainly see the patient much sooner if necessary.  The patient and plan discussed with Glenford Peers, MD and he is in agreement with the aforementioned.  KEFALAS,THOMAS

## 2012-05-19 ENCOUNTER — Ambulatory Visit (HOSPITAL_COMMUNITY): Payer: BC Managed Care – PPO | Admitting: Oncology

## 2012-05-19 ENCOUNTER — Encounter (HOSPITAL_COMMUNITY): Payer: BC Managed Care – PPO | Attending: Oncology | Admitting: Oncology

## 2012-05-19 ENCOUNTER — Encounter (HOSPITAL_COMMUNITY): Payer: Self-pay | Admitting: Oncology

## 2012-05-19 ENCOUNTER — Encounter (HOSPITAL_COMMUNITY): Payer: Self-pay | Admitting: Dietician

## 2012-05-19 VITALS — BP 144/84 | HR 99 | Temp 97.8°F | Resp 18 | Wt 296.0 lb

## 2012-05-19 DIAGNOSIS — C50911 Malignant neoplasm of unspecified site of right female breast: Secondary | ICD-10-CM

## 2012-05-19 DIAGNOSIS — Z09 Encounter for follow-up examination after completed treatment for conditions other than malignant neoplasm: Secondary | ICD-10-CM | POA: Insufficient documentation

## 2012-05-19 DIAGNOSIS — C50919 Malignant neoplasm of unspecified site of unspecified female breast: Secondary | ICD-10-CM

## 2012-05-19 DIAGNOSIS — C773 Secondary and unspecified malignant neoplasm of axilla and upper limb lymph nodes: Secondary | ICD-10-CM

## 2012-05-19 DIAGNOSIS — Z853 Personal history of malignant neoplasm of breast: Secondary | ICD-10-CM | POA: Insufficient documentation

## 2012-05-19 DIAGNOSIS — E119 Type 2 diabetes mellitus without complications: Secondary | ICD-10-CM | POA: Insufficient documentation

## 2012-05-19 LAB — CBC WITH DIFFERENTIAL/PLATELET
Eosinophils Absolute: 0.3 10*3/uL (ref 0.0–0.7)
Eosinophils Relative: 3 % (ref 0–5)
HCT: 40 % (ref 36.0–46.0)
Lymphocytes Relative: 14 % (ref 12–46)
Lymphs Abs: 1.7 10*3/uL (ref 0.7–4.0)
MCH: 28.2 pg (ref 26.0–34.0)
MCV: 83.5 fL (ref 78.0–100.0)
Monocytes Absolute: 0.6 10*3/uL (ref 0.1–1.0)
Monocytes Relative: 5 % (ref 3–12)
RBC: 4.79 MIL/uL (ref 3.87–5.11)
WBC: 12.5 10*3/uL — ABNORMAL HIGH (ref 4.0–10.5)

## 2012-05-19 LAB — COMPREHENSIVE METABOLIC PANEL
ALT: 35 U/L (ref 0–35)
BUN: 19 mg/dL (ref 6–23)
CO2: 27 mEq/L (ref 19–32)
Calcium: 10.2 mg/dL (ref 8.4–10.5)
Creatinine, Ser: 0.72 mg/dL (ref 0.50–1.10)
GFR calc Af Amer: >90 mL/min (ref >90)
GFR calc non Af Amer: >90 mL/min (ref >90)
Glucose, Bld: 199 mg/dL — ABNORMAL HIGH (ref 70–99)
Sodium: 138 mEq/L (ref 135–145)
Total Protein: 7.5 g/dL (ref 6.0–8.3)

## 2012-05-19 NOTE — Progress Notes (Signed)
Nutrition Note  Pt identified with weight loss on [facility] Nutrition Screen.   Wt Readings from Last 10 Encounters:  05/19/12 296 lb (134.265 kg)  11/26/11 299 lb (135.626 kg)  11/19/11 299 lb 12.8 oz (135.988 kg)  08/21/11 298 lb 12.8 oz (135.535 kg)  05/21/11 301 lb 6.4 oz (136.714 kg)  02/19/11 294 lb 9.6 oz (133.63 kg)  01/05/11 283 lb 4.7 oz (128.5 kg)  12/26/10 284 lb 6.4 oz (129.003 kg)  11/20/10 281 lb (127.461 kg)  05/22/10 295 lb 12.8 oz (134.174 kg)   Chart reviewed. Pt familiar to me due to established pt visit on 11/26/11, where she was referred for diabetes and obesity. Pt has been intentionally trying to lose weight. Per MD notes, pt has been to bariatric surgery informational meetings and desires to lose weight without surgical interventions. Pt has not followed up since initial consult with me.  Wt hx reveals a 3# (1%) wt loss x 6 months, 2# (0.7%) wt loss x 9 months, and 5# (1.7%) wt loss x 1 year. Wt change is not clinically significant. Wt loss desirable due to obesity, diabetes, and pt desire to lose weight.  Pt not at nutrition risk at this time. Will sign off. If further nutrition issues arise, please consult RD.  Melody Haver, RD, LDN Pager: [phone]

## 2012-05-19 NOTE — Patient Instructions (Addendum)
[facility] Discharge Instructions  RECOMMENDATIONS MADE BY THE CONSULTANT AND ANY TEST RESULTS WILL BE SENT TO YOUR REFERRING PHYSICIAN.  EXAM FINDINGS BY THE PHYSICIAN TODAY AND SIGNS OR SYMPTOMS TO REPORT TO CLINIC OR PRIMARY PHYSICIAN: Exam and discussion by PA.  You are doing well.  Will check some blood work today.  If there are any abnormalities we will call you.  MEDICATIONS PRESCRIBED:  none  INSTRUCTIONS GIVEN AND DISCUSSED: Report any new lumps, bone pain, shortness of breath or other symptoms.  SPECIAL INSTRUCTIONS/FOLLOW-UP: Follow-up in 6 months.  Thank you for choosing [facility] to provide your oncology and hematology care.  To afford each patient quality time with our providers, please arrive at least 15 minutes before your scheduled appointment time.  With your help, our goal is to use those 15 minutes to complete the necessary work-up to ensure our physicians have the information they need to help with your evaluation and healthcare recommendations.    Effective January 1st, 2014, we ask that you re-schedule your appointment with our physicians should you arrive 10 or more minutes late for your appointment.  We strive to give you quality time with our providers, and arriving late affects you and other patients whose appointments are after yours.    Again, thank you for choosing [facility].  Our hope is that these requests will decrease the amount of time that you wait before being seen by our physicians.       _____________________________________________________________  Should you have questions after your visit to [facility], please contact our office at [phone] between the hours of 8:30 a.m. and 5:00 p.m.  Voicemails left after 4:30 p.m. will not be returned until the following business day.  For prescription refill requests, have your pharmacy contact our office with your prescription refill request.

## 2012-06-05 ENCOUNTER — Telehealth (HOSPITAL_COMMUNITY): Payer: Self-pay | Admitting: Oncology

## 2012-06-05 ENCOUNTER — Other Ambulatory Visit (HOSPITAL_COMMUNITY): Payer: Self-pay | Admitting: Oncology

## 2012-06-05 ENCOUNTER — Encounter (HOSPITAL_COMMUNITY): Payer: Self-pay | Admitting: Oncology

## 2012-06-05 NOTE — Telephone Encounter (Signed)
I suppose that there was a miscommunication between myself and the patient.  On our last encounter, she requested a letter for a second opinion regarding her lower extremity varicose veins.  I presumed that she needed a letter for approval to see a specialist for a second opinion, but in actuality, the patient wanted me to be the second opinion.  I am unable to do so as this requires a specialist and is certainly out of the scope of practice of the [facility].  I have offered to refer the patient for a second opinion, but she has declined at this time.   KEFALAS,THOMAS

## 2012-11-10 ENCOUNTER — Other Ambulatory Visit (HOSPITAL_COMMUNITY): Payer: Self-pay | Admitting: Oncology

## 2012-11-14 ENCOUNTER — Other Ambulatory Visit: Payer: Self-pay | Admitting: Internal Medicine

## 2012-11-14 DIAGNOSIS — Z853 Personal history of malignant neoplasm of breast: Secondary | ICD-10-CM

## 2012-11-19 ENCOUNTER — Ambulatory Visit (HOSPITAL_COMMUNITY): Payer: BC Managed Care – PPO

## 2012-11-20 NOTE — Progress Notes (Signed)
This encounter was created in error - please disregard.

## 2012-12-09 ENCOUNTER — Ambulatory Visit
Admission: RE | Admit: 2012-12-09 | Discharge: 2012-12-09 | Disposition: A | Discharge status: Home / Self Care | Payer: BC Managed Care – PPO | Source: Ambulatory Visit | Attending: Internal Medicine | Admitting: Internal Medicine

## 2012-12-09 DIAGNOSIS — Z853 Personal history of malignant neoplasm of breast: Secondary | ICD-10-CM

## 2012-12-12 ENCOUNTER — Encounter (HOSPITAL_COMMUNITY): Payer: Self-pay

## 2012-12-12 ENCOUNTER — Encounter (HOSPITAL_COMMUNITY): Payer: BC Managed Care – PPO | Attending: Hematology and Oncology

## 2012-12-12 VITALS — BP 119/70 | HR 82 | Temp 97.6°F | Resp 16 | Wt 273.3 lb

## 2012-12-12 DIAGNOSIS — C50619 Malignant neoplasm of axillary tail of unspecified female breast: Secondary | ICD-10-CM

## 2012-12-12 DIAGNOSIS — Z170 Estrogen receptor positive status [ER+]: Secondary | ICD-10-CM

## 2012-12-12 DIAGNOSIS — Z09 Encounter for follow-up examination after completed treatment for conditions other than malignant neoplasm: Secondary | ICD-10-CM | POA: Insufficient documentation

## 2012-12-12 DIAGNOSIS — C50919 Malignant neoplasm of unspecified site of unspecified female breast: Secondary | ICD-10-CM

## 2012-12-12 DIAGNOSIS — E039 Hypothyroidism, unspecified: Secondary | ICD-10-CM | POA: Insufficient documentation

## 2012-12-12 DIAGNOSIS — E119 Type 2 diabetes mellitus without complications: Secondary | ICD-10-CM | POA: Insufficient documentation

## 2012-12-12 DIAGNOSIS — C50911 Malignant neoplasm of unspecified site of right female breast: Secondary | ICD-10-CM

## 2012-12-12 DIAGNOSIS — C773 Secondary and unspecified malignant neoplasm of axilla and upper limb lymph nodes: Secondary | ICD-10-CM

## 2012-12-12 DIAGNOSIS — Z853 Personal history of malignant neoplasm of breast: Secondary | ICD-10-CM | POA: Insufficient documentation

## 2012-12-12 LAB — COMPREHENSIVE METABOLIC PANEL
AST: 34 U/L (ref 0–37)
Albumin: 4.1 g/dL (ref 3.5–5.2)
Alkaline Phosphatase: 105 U/L (ref 39–117)
BUN: 19 mg/dL (ref 6–23)
CO2: 29 mEq/L (ref 19–32)
Chloride: 100 mEq/L (ref 96–112)
Potassium: 4.3 mEq/L (ref 3.5–5.1)
Total Bilirubin: 0.7 mg/dL (ref 0.3–1.2)

## 2012-12-12 LAB — CBC WITH DIFFERENTIAL/PLATELET
Basophils Absolute: 0 10*3/uL (ref 0.0–0.1)
Basophils Relative: 0 % (ref 0–1)
Hemoglobin: 14.5 g/dL (ref 12.0–15.0)
Lymphocytes Relative: 14 % (ref 12–46)
MCHC: 31.9 g/dL (ref 30.0–36.0)
Monocytes Relative: 5 % (ref 3–12)
Neutro Abs: 8.4 10*3/uL — ABNORMAL HIGH (ref 1.7–7.7)
Neutrophils Relative %: 78 % — ABNORMAL HIGH (ref 43–77)
WBC: 10.8 10*3/uL — ABNORMAL HIGH (ref 4.0–10.5)

## 2012-12-12 NOTE — Patient Instructions (Signed)
[facility] Discharge Instructions  RECOMMENDATIONS MADE BY THE CONSULTANT AND ANY TEST RESULTS WILL BE SENT TO YOUR REFERRING PHYSICIAN.  Lab work today. We will call you if there are any abnormal results. Return to clinic in 6 months for MD appointment. Report any issues/concerns to clinic as needed prior to appointment.  Thank you for choosing [facility] to provide your oncology and hematology care.  To afford each patient quality time with our providers, please arrive at least 15 minutes before your scheduled appointment time.  With your help, our goal is to use those 15 minutes to complete the necessary work-up to ensure our physicians have the information they need to help with your evaluation and healthcare recommendations.    Effective January 1st, 2014, we ask that you re-schedule your appointment with our physicians should you arrive 10 or more minutes late for your appointment.  We strive to give you quality time with our providers, and arriving late affects you and other patients whose appointments are after yours.    Again, thank you for choosing [facility].  Our hope is that these requests will decrease the amount of time that you wait before being seen by our physicians.       _____________________________________________________________  Should you have questions after your visit to [facility], please contact our office at [phone] between the hours of 8:30 a.m. and 5:00 p.m.  Voicemails left after 4:30 p.m. will not be returned until the following business day.  For prescription refill requests, have your pharmacy contact our office with your prescription refill request.

## 2012-12-12 NOTE — Progress Notes (Signed)
[facility]  OFFICE PROGRESS NOTE  Colette Ribas, MD [address] [place] [address]  DIAGNOSIS: Infiltrating ductal carcinoma of breast, right  Diabetes  Hypothyroidism  Breast cancer, unspecified laterality - Plan: CBC with Differential, Comprehensive metabolic panel, Cancer antigen 27.29  Chief Complaint  Patient presents with  . Breast Cancer    CURRENT THERAPY: Anastrozole 1 mg daily.  INTERVAL HISTORY: Kelly Jacobson 52 y.o. female returns for followup of stage II right breast cancer while taking anastrozole 1 mg daily treated in the past with lumpectomy, axillary dissection, 4 cycles of epirubicin and Cytoxan followed by Taxol x12 followed by adjuvant radiotherapy with anastrozole started on 12/22/2009. 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 She does have hot flashes but typically at night but she is not interested in any medication to try to control them. Appetite is excellent she has been voluntarily losing weight in an attempt to limit any need for medication for diabetes. Thus far she has lost 23 pounds. She denies any fever, headache, nasal drip that is symptomatic and troublesome, lower extremity swelling or redness, lymphedema, vaginal dryness or bleeding, worsening joint pain, skin rash, headache, or seizures.   MEDICAL HISTORY: Past Medical History  Diagnosis Date  . Diabetes mellitus   . Thyroid disease   . DM (diabetes mellitus) 11/20/2010  . Hot flashes secondary to Arimidex 11/20/2010  . Breast cancer     right  . Hypothyroidism 11/20/2010    Synthroid  . Edema     hands and feet at times, takes as a direutic  . Depression     wellburtin  . Arthritis     Hands and legs  . Anxiety     Xanax  . Varicose veins 2014    INTERIM HISTORY: has Infiltrating ductal carcinoma of breast; Hypothyroidism; DM (diabetes mellitus); and Hot flashes secondary to  Arimidex on her problem list.   52 y.o. female returns for regular visit for followup of Stage II (T1 C., N1) grade 2 infiltrating ductal carcinoma the right breast occurred in the upper axilla. This was most likely an axillary tail of Spence tumor with a 1.9 cm mass with 3 of 26 positive nodes with extracapsular extension. She is status post surgical resection on 01/25/2009, lymph node dissection on 04/23/2009, followed by reexcision on 03/16/2009 with clear margins. Her estrogen receptors were 62% positive, progesterone receptors were 36% positive, and Ki-67 marker was 33%. HER-2/neu was nonamplified. She was treated with epirubicin and Cytoxan in a dose dense fashion x4 cycles and then weekly Taxol x12 weeks. She then went on to have radiation therapy and started Arimidex on 12/22/2009  ALLERGIES:  is allergic to darvocet and tape.  MEDICATIONS: has a current medication list which includes the following prescription(s): alprazolam, anastrozole, multivitamin, stress formula, bupropion, calcium carbonate, canagliflozin, cholecalciferol, diphenhydramine, docusate sodium, hydrocodone-acetaminophen, ibuprofen, insulin glargine, iron, levothyroxine, liraglutide, metformin, multiple vitamins-minerals, venlafaxine, vitamin e, zolpidem, and hydrochlorothiazide, and the following Facility-Administered Medications: sodium chloride.  SURGICAL HISTORY:  Past Surgical History  Procedure Laterality Date  . Breast reduction surgery  1991  . Carpal tunnel release  04/06/1998  . Abdominal hysterectomy  2001    vaginal for endometriosis  . Debridement tennis elbow  2003  . Cervical fusion  01/15/2007  . Axillary cyst  01/25/2009  . Axillary node dissection  02/23/2009 and 03/16/2009  . Bilateral oophorectomy  04/19/2009  .  Portacath placement  05/02/2009  . Trigger thumb surgery  02/01/2010  . Rectocele surgery  03/07/10  . Port-a-cath removal  01/16/2011    Procedure: REMOVAL PORT-A-CATH;  Surgeon: Almond Lint, MD;   Location: [facility] OR;  Service: General;  Laterality: N/A;  Removal port-a-cath.  . Carpal tunnel release  [id number]    lt hand    FAMILY HISTORY: family history is negative for Anesthesia problems, Hypotension, Malignant hyperthermia, and Pseudochol deficiency.  SOCIAL HISTORY:  reports that she has never smoked. She has never used smokeless tobacco. She reports that she does not drink alcohol or use illicit drugs.  REVIEW OF SYSTEMS:  Other than that discussed above is noncontributory.  PHYSICAL EXAMINATION: ECOG PERFORMANCE STATUS: 1 - Symptomatic but completely ambulatory  Blood pressure 119/70, pulse 82, temperature 97.6 F (36.4 C), temperature source Oral, resp. rate 16, weight 273 lb 4.8 oz (123.968 kg).  GENERAL:alert, no distress and comfortable. Moderately obese. SKIN: skin color, texture, turgor are normal, no rashes or significant lesions EYES: PERLA; Conjunctiva are pink and non-injected, sclera clear OROPHARYNX:no exudate, no erythema on lips, buccal mucosa, or tongue. NECK: supple, thyroid normal size, non-tender, without nodularity. No masses CHEST: Status post right breast lumpectomy with no masses in either breast. LYMPH:  no palpable lymphadenopathy in the cervical, axillary or inguinal LUNGS: clear to auscultation and percussion with normal breathing effort HEART: regular rate & rhythm and no murmurs. ABDOMEN:abdomen soft, non-tender and normal bowel sounds MUSCULOSKELETAL:no cyanosis of digits and no clubbing. Range of motion normal.  NEURO: alert & oriented x 3 with fluent speech, no focal motor/sensory deficits   LABORATORY DATA: Office Visit on 12/12/2012  Component Date Value Range Status  . WBC 12/12/2012 10.8* 4.0 - 10.5 K/uL Final  . RBC 12/12/2012 5.33* 3.87 - 5.11 MIL/uL Final  . Hemoglobin 12/12/2012 14.5  12.0 - 15.0 g/dL Final  . HCT 16/10/9602 45.4  36.0 - 46.0 % Final  . MCV 12/12/2012 85.2  78.0 - 100.0 fL Final  . MCH 12/12/2012 27.2  26.0 - 34.0  pg Final  . MCHC 12/12/2012 31.9  30.0 - 36.0 g/dL Final  . RDW 54/09/8117 14.0  11.5 - 15.5 % Final  . Platelets 12/12/2012 305  150 - 400 K/uL Final  . Neutrophils Relative % 12/12/2012 78* 43 - 77 % Final  . Neutro Abs 12/12/2012 8.4* 1.7 - 7.7 K/uL Final  . Lymphocytes Relative 12/12/2012 14  12 - 46 % Final  . Lymphs Abs 12/12/2012 1.5  0.7 - 4.0 K/uL Final  . Monocytes Relative 12/12/2012 5  3 - 12 % Final  . Monocytes Absolute 12/12/2012 0.6  0.1 - 1.0 K/uL Final  . Eosinophils Relative 12/12/2012 2  0 - 5 % Final  . Eosinophils Absolute 12/12/2012 0.3  0.0 - 0.7 K/uL Final  . Basophils Relative 12/12/2012 0  0 - 1 % Final  . Basophils Absolute 12/12/2012 0.0  0.0 - 0.1 K/uL Final  . Sodium 12/12/2012 139  135 - 145 mEq/L Final  . Potassium 12/12/2012 4.3  3.5 - 5.1 mEq/L Final  . Chloride 12/12/2012 100  96 - 112 mEq/L Final  . CO2 12/12/2012 29  19 - 32 mEq/L Final  . Glucose, Bld 12/12/2012 114* 70 - 99 mg/dL Final  . BUN 14/78/2956 19  6 - 23 mg/dL Final  . Creatinine, Ser 12/12/2012 0.78  0.50 - 1.10 mg/dL Final  . Calcium 21/30/8657 10.4  8.4 - 10.5 mg/dL Final  . Total Protein  12/12/2012 7.8  6.0 - 8.3 g/dL Final  . Albumin 47/82/9562 4.1  3.5 - 5.2 g/dL Final  . AST 13/08/6576 34  0 - 37 U/L Final  . ALT 12/12/2012 24  0 - 35 U/L Final  . Alkaline Phosphatase 12/12/2012 105  39 - 117 U/L Final  . Total Bilirubin 12/12/2012 0.7  0.3 - 1.2 mg/dL Final  . GFR calc non Af Amer 12/12/2012 >90  >90 mL/min Final  . GFR calc Af Amer 12/12/2012 >90  >90 mL/min Final   Comment: (NOTE)                          The eGFR has been calculated using the CKD EPI equation.                          This calculation has not been validated in all clinical situations.                          eGFR's persistently <90 mL/min signify possible Chronic Kidney                          Disease.    PATHOLOGY: No new biology.  Urinalysis No results found for this basename: colorurine,   appearanceur,  labspec,  phurine,  glucoseu,  hgbur,  bilirubinur,  ketonesur,  proteinur,  urobilinogen,  nitrite,  leukocytesur    RADIOGRAPHIC STUDIES: Mm Digital Diagnostic Bilat  12/09/2012   CLINICAL DATA:  Patient underwent right lumpectomy and chemotherapy for breast cancer in 2011.  EXAM: DIGITAL DIAGNOSTIC  BILATERAL MAMMOGRAM WITH CAD  DIGITAL BREAST TOMOSYNTHESIS  Digital breast tomosynthesis images are acquired in two projections. These images are reviewed in combination with the digital mammogram, confirming the findings below.  COMPARISON:  Prior studies dating back to 08/09/2008 from [facility].  ACR Breast Density Category b: There are scattered areas of fibroglandular density.  FINDINGS: Reduction mammoplasty changes are present. There is no suspicious dominant mass, nonsurgical architectural distortion, or calcification to suggest malignancy.  Mammographic images were processed with CAD.  IMPRESSION: No mammographic evidence of malignancy.  RECOMMENDATION: Yearly diagnostic mammography is suggested.  I have discussed the findings and recommendations with the patient. Results were also provided in writing at the conclusion of the visit. If applicable, a reminder letter will be sent to the patient regarding the next appointment.  BI-RADS CATEGORY  2: Benign Finding(s)   Electronically Signed   By: Cain Saupe M.D.   On: 12/09/2012 09:59    ASSESSMENT:  #1.52 y.o. female returns for regular visit for followup of Stage II (T1 C., N1) grade 2 infiltrating ductal carcinoma the right breast occurred in the upper axilla. This was most likely an axillary tail of Spence tumor with a 1.9 cm mass with 3 of 26 positive nodes with extracapsular extension. She is status post surgical resection on 01/25/2009, lymph node dissection on 04/23/2009, followed by reexcision on 03/16/2009 with clear margins. Her estrogen receptors were 62% positive, progesterone receptors were 36% positive, and  Ki-67 marker was 33%. HER-2/neu was nonamplified. She was treated with epirubicin and Cytoxan in a dose dense fashion x4 cycles and then weekly Taxol x12 weeks. She then went on to have radiation therapy and started Arimidex on 12/22/2009. Thus far she is without evidence of recurrent disease.  #2. Diabetes mellitus, type  II, non-insulin requiring, controlled with voluntary weight loss and medication. #3. Hypothyroidism, on treatment. #4. 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  PLAN:  #1. Continue anastrozole 1 mg daily. #2. CBC, chem profile, tumor markers. #3. Continue weight loss program in hopes of eliminating need for medication for diabetes. Patient was complemented on her success thus far. #4. Followup in 6 months with physical exam and lab tests. Patient was told to call should any new symptoms occur that are troublesome and persistent.   All questions were answered. The patient knows to call the clinic with any problems, questions or concerns. We can certainly see the patient much sooner if necessary.   I spent 25 minutes counseling the patient face to face. The total time spent in the appointment was 30 minutes.    Maurilio Lovely, MD 12/12/2012 11:55 AM

## 2013-02-05 ENCOUNTER — Other Ambulatory Visit (HOSPITAL_COMMUNITY): Payer: Self-pay | Admitting: Oncology

## 2013-04-20 ENCOUNTER — Telehealth: Payer: Self-pay | Admitting: *Deleted

## 2013-04-20 NOTE — Telephone Encounter (Signed)
Pt called to schedule her colonoscopy. Please Advise [phone]

## 2013-04-20 NOTE — Telephone Encounter (Signed)
Gastroenterology Pre-Procedure Review  Request Date: Requesting Physician: Kelly Jacobson  PATIENT REVIEW QUESTIONS: The patient responded to the following health history questions as indicated:    1. Diabetes Melitis: YES 2. Joint replacements in the past 12 months: NO 3. Major health problems in the past 3 months: NO 4. Has an artificial valve or MVP: NO 5. Has a defibrillator: NO 6. Has been advised in past to take antibiotics in advance of a procedure like teeth cleaning: NO 7.Family colon cancer:NO 52. Aclohol: NO    MEDICATIONS & ALLERGIES:    Patient reports the following regarding taking any blood thinners:   Plavix? NO Aspirin? NO Coumadin? NO  Patient confirms/reports the following medications:  Current Outpatient Prescriptions  Medication Sig Dispense Refill  . ALPRAZolam (XANAX) 0.25 MG tablet Take 0.25 mg by mouth 3 (three) times daily as needed.      Marland Kitchen anastrozole (ARIMIDEX) 1 MG tablet TAKE (1) TABLET BY MOUTH ONCE DAILY.  30 tablet  5  . B Complex-C-Folic Acid (MULTIVITAMIN, STRESS FORMULA) tablet Take 1 tablet by mouth daily.        Marland Kitchen buPROPion (WELLBUTRIN SR) 200 MG 12 hr tablet Take 200 mg by mouth daily.       . calcium carbonate (OS-CAL) 600 MG TABS Take 1,200 mg by mouth daily.       . Canagliflozin (INVOKANA) 300 MG TABS Take 1 tablet by mouth.      . cholecalciferol (VITAMIN D) 1000 UNITS tablet Take 1,000 Units by mouth daily.        . diphenhydrAMINE (BENADRYL) 25 MG tablet Take 25 mg by mouth at bedtime as needed.      . docusate sodium (COLACE) 100 MG capsule Take 100 mg by mouth as needed.       . hydrochlorothiazide 25 MG tablet Take 25 mg by mouth as needed.       Marland Kitchen HYDROcodone-acetaminophen (NORCO/VICODIN) 5-325 MG per tablet       . ibuprofen (ADVIL,MOTRIN) 800 MG tablet Take 800 mg by mouth every 8 (eight) hours as needed.        . insulin glargine (LANTUS) 100 UNIT/ML injection Inject 30 Units into the skin daily.       . IRON PO Take 1 tablet by mouth  daily. OTC      . levothyroxine (SYNTHROID, LEVOTHROID) 50 MCG tablet Take 50 mcg by mouth daily.        . Liraglutide (VICTOZA) 18 MG/3ML SOLN Inject 1.8 mLs into the skin daily.       . metFORMIN (GLUCOPHAGE) 1000 MG tablet Take 1,000 mg by mouth daily with breakfast.       . Multiple Vitamins-Minerals (WOMENS DAILY FORMULA PO) Take by mouth daily.      Marland Kitchen venlafaxine (EFFEXOR) 37.5 MG tablet TAKE (1) TABLET BY MOUTH ONCE DAILY.  30 tablet  3  . vitamin E 1000 UNIT capsule Take 400 Units by mouth daily.       Marland Kitchen zolpidem (AMBIEN) 10 MG tablet Take 10 mg by mouth at bedtime as needed.         No current facility-administered medications for this visit.   Facility-Administered Medications Ordered in Other Visits  Medication Dose Route Frequency Provider Last Rate Last Dose  . sodium chloride 0.9 % injection 10 mL  10 mL Intravenous PRN Baird Cancer, PA-C   10 mL at 11/20/10 1326    Patient confirms/reports the following allergies:  Allergies  Allergen Reactions  . Darvocet [Propoxyphene  N-Acetaminophen] Other (See Comments)    hallucinations  . Tape Hives    No orders of the defined types were placed in this encounter.    AUTHORIZATION INFORMATION Primary Insurance: [employer]   ID #: [id number]  Group #:  Pre-Cert / Josem Kaufmann required:  Pre-Cert / Auth #:   SCHEDULE INFORMATION: Procedure has been scheduled as follows:  Date: , Time:   Location:   This Gastroenterology Pre-Precedure Review Form is being routed to the following provider(s):   She would like to have SLF. She would like for it to be near the end of May.

## 2013-04-21 ENCOUNTER — Other Ambulatory Visit: Payer: Self-pay

## 2013-04-21 DIAGNOSIS — Z1211 Encounter for screening for malignant neoplasm of colon: Secondary | ICD-10-CM

## 2013-04-21 NOTE — Telephone Encounter (Signed)
I called pt and she is scheduled for screening colonoscopy with Dr. Oneida Alar on 05/07/2013 at 8:30 AM.

## 2013-04-22 ENCOUNTER — Encounter (HOSPITAL_COMMUNITY): Payer: Self-pay | Admitting: Pharmacy Technician

## 2013-04-24 ENCOUNTER — Ambulatory Visit: Payer: BC Managed Care – PPO | Admitting: Cardiology

## 2013-04-30 ENCOUNTER — Telehealth: Payer: Self-pay

## 2013-04-30 NOTE — Telephone Encounter (Signed)
Patient is calling stating that she has not received any prep instructions in the mail, please advise?

## 2013-04-30 NOTE — Telephone Encounter (Signed)
MOVI PREP SPLIT DOSING, REGULAR BREAKFAST. CLEAR LIQUIDS AFTER 9 AM.  CONTINUE GLUCOPHAGE.  HOLD IRON FOR 7 DAYS.  PHENERGAN 12.5 MG IV IN PREOP.

## 2013-05-01 ENCOUNTER — Other Ambulatory Visit: Payer: Self-pay

## 2013-05-01 DIAGNOSIS — Z1211 Encounter for screening for malignant neoplasm of colon: Secondary | ICD-10-CM

## 2013-05-01 MED ORDER — PEG-KCL-NACL-NASULF-NA ASC-C 100 G PO SOLR: 1.0000 | ORAL | Status: DC

## 2013-05-01 NOTE — Telephone Encounter (Signed)
Called and left message with a lady that I am mailing Shanitha's instructions today.

## 2013-05-01 NOTE — Addendum Note (Signed)
Addended by: Everardo All on: 05/01/2013 10:08 AM   Modules accepted: Orders

## 2013-05-01 NOTE — Telephone Encounter (Signed)
Rx sent to the pharmacy and instructions mailed to pt.  

## 2013-05-01 NOTE — Telephone Encounter (Signed)
Phenergan 12.5 mg IV was added to the previous hospital orders.

## 2013-05-06 ENCOUNTER — Telehealth: Payer: Self-pay

## 2013-05-06 NOTE — Telephone Encounter (Addendum)
Pt called this morning because she did not get her instruction for her TCS until this morning. She did not know to hold her iron for the 7 days. Her last dose was Tuesday. She does not want to have to reschedule if possible. Her call back number is [phone]. Please advise

## 2013-05-06 NOTE — Telephone Encounter (Signed)
Pt is aware.  

## 2013-05-06 NOTE — Telephone Encounter (Signed)
OK TO PROCEED WITH TCS THUR.

## 2013-05-07 ENCOUNTER — Encounter (HOSPITAL_COMMUNITY)
Admission: RE | Disposition: A | Discharge status: Home / Self Care | Payer: Self-pay | Source: Ambulatory Visit | Attending: Gastroenterology

## 2013-05-07 ENCOUNTER — Ambulatory Visit (HOSPITAL_COMMUNITY)
Admission: RE | Admit: 2013-05-07 | Discharge: 2013-05-07 | Disposition: A | Discharge status: Home / Self Care | Payer: BC Managed Care – PPO | Source: Ambulatory Visit | Attending: Gastroenterology | Admitting: Gastroenterology

## 2013-05-07 ENCOUNTER — Encounter (HOSPITAL_COMMUNITY): Payer: Self-pay | Admitting: *Deleted

## 2013-05-07 DIAGNOSIS — Z885 Allergy status to narcotic agent status: Secondary | ICD-10-CM | POA: Insufficient documentation

## 2013-05-07 DIAGNOSIS — E119 Type 2 diabetes mellitus without complications: Secondary | ICD-10-CM | POA: Insufficient documentation

## 2013-05-07 DIAGNOSIS — Z1211 Encounter for screening for malignant neoplasm of colon: Secondary | ICD-10-CM | POA: Insufficient documentation

## 2013-05-07 DIAGNOSIS — I8390 Asymptomatic varicose veins of unspecified lower extremity: Secondary | ICD-10-CM | POA: Insufficient documentation

## 2013-05-07 DIAGNOSIS — M129 Arthropathy, unspecified: Secondary | ICD-10-CM | POA: Insufficient documentation

## 2013-05-07 DIAGNOSIS — Z794 Long term (current) use of insulin: Secondary | ICD-10-CM | POA: Insufficient documentation

## 2013-05-07 DIAGNOSIS — E039 Hypothyroidism, unspecified: Secondary | ICD-10-CM | POA: Insufficient documentation

## 2013-05-07 DIAGNOSIS — F3289 Other specified depressive episodes: Secondary | ICD-10-CM | POA: Insufficient documentation

## 2013-05-07 DIAGNOSIS — K5730 Diverticulosis of large intestine without perforation or abscess without bleeding: Secondary | ICD-10-CM | POA: Insufficient documentation

## 2013-05-07 DIAGNOSIS — Z79899 Other long term (current) drug therapy: Secondary | ICD-10-CM | POA: Insufficient documentation

## 2013-05-07 DIAGNOSIS — F329 Major depressive disorder, single episode, unspecified: Secondary | ICD-10-CM | POA: Insufficient documentation

## 2013-05-07 DIAGNOSIS — F411 Generalized anxiety disorder: Secondary | ICD-10-CM | POA: Insufficient documentation

## 2013-05-07 DIAGNOSIS — K648 Other hemorrhoids: Secondary | ICD-10-CM | POA: Insufficient documentation

## 2013-05-07 HISTORY — PX: COLONOSCOPY: SHX5424

## 2013-05-07 LAB — GLUCOSE, CAPILLARY: Glucose-Capillary: 173 mg/dL — ABNORMAL HIGH (ref 70–99)

## 2013-05-07 SURGERY — COLONOSCOPY
Anesthesia: Moderate Sedation

## 2013-05-07 MED ORDER — MIDAZOLAM HCL 5 MG/5ML IJ SOLN
INTRAMUSCULAR | Status: DC | PRN
  Administered 2013-05-07: 1 mg via INTRAVENOUS
  Administered 2013-05-07 (×3): 2 mg via INTRAVENOUS

## 2013-05-07 MED ORDER — PROMETHAZINE HCL 25 MG/ML IJ SOLN
12.5000 mg | Freq: Once | INTRAMUSCULAR | Status: AC
  Administered 2013-05-07: 12.5 mg via INTRAVENOUS
  Filled 2013-05-07: qty 1

## 2013-05-07 MED ORDER — SODIUM CHLORIDE 0.9 % IV SOLN
INTRAVENOUS | Status: DC
  Administered 2013-05-07: 08:00:00 via INTRAVENOUS

## 2013-05-07 MED ORDER — MEPERIDINE HCL 100 MG/ML IJ SOLN
INTRAMUSCULAR | Status: DC | PRN
  Administered 2013-05-07 (×3): 25 mg via INTRAVENOUS
  Administered 2013-05-07: 50 mg via INTRAVENOUS

## 2013-05-07 MED ORDER — MIDAZOLAM HCL 5 MG/5ML IJ SOLN
INTRAMUSCULAR | Status: AC
  Filled 2013-05-07: qty 10

## 2013-05-07 MED ORDER — STERILE WATER FOR IRRIGATION IR SOLN
Status: DC | PRN
  Administered 2013-05-07: 09:00:00

## 2013-05-07 MED ORDER — MEPERIDINE HCL 100 MG/ML IJ SOLN
INTRAMUSCULAR | Status: AC
  Filled 2013-05-07: qty 2

## 2013-05-07 MED ORDER — SODIUM CHLORIDE 0.9 % IJ SOLN
INTRAMUSCULAR | Status: AC
  Filled 2013-05-07: qty 10

## 2013-05-07 MED ORDER — SODIUM CHLORIDE 0.9 % IV SOLN: INTRAVENOUS | Status: DC

## 2013-05-07 NOTE — H&P (Signed)
Primary Care Physician:  [name], MD Primary Gastroenterologist:  Dr. Oneida Alar  Pre-Procedure History & Physical: HPI:  Kelly Jacobson is a 53 y.o. female here for [place].  Past Medical History  Diagnosis Date  . Diabetes mellitus   . Thyroid disease   . DM (diabetes mellitus) 11/20/2010  . Hot flashes secondary to Arimidex 11/20/2010  . Breast cancer     right  . Hypothyroidism 11/20/2010    Synthroid  . Edema     hands and feet at times, takes as a direutic  . Depression     wellburtin  . Arthritis     Hands and legs  . Anxiety     Xanax  . Varicose veins 2014    Past Surgical History  Procedure Laterality Date  . Breast reduction surgery  1991  . Carpal tunnel release  04/06/1998  . Abdominal hysterectomy  2001    vaginal for endometriosis  . Debridement tennis elbow  2003  . Cervical fusion  01/15/2007  . Axillary cyst  01/25/2009  . Axillary node dissection  02/23/2009 and 03/16/2009  . Bilateral oophorectomy  04/19/2009  . Portacath placement  05/02/2009  . Trigger thumb surgery  02/01/2010  . Rectocele surgery  03/07/10  . Port-a-cath removal  01/16/2011    Procedure: REMOVAL PORT-A-CATH;  Surgeon: Stark Klein, MD;  Location: [place];  Service: General;  Laterality: N/A;  Removal port-a-cath.  . Carpal tunnel release  [id number]    lt hand    Prior to Admission medications   Medication Sig Start Date End Date Taking? Authorizing Provider  anastrozole (ARIMIDEX) 1 MG tablet Take 1 mg by mouth daily.   Yes Historical Provider, MD  diphenhydrAMINE (BENADRYL) 25 MG tablet Take 25 mg by mouth at bedtime as needed for allergies.    Yes Historical Provider, MD  Insulin Glargine (LANTUS SOLOSTAR) 100 UNIT/ML Solostar Pen Inject 30 Units into the skin daily.   Yes Historical Provider, MD  peg 3350 powder (MOVIPREP) 100 G SOLR Take 1 kit (200 g total) by mouth as directed. 05/01/13  Yes Danie Binder, MD  venlafaxine (EFFEXOR) 37.5 MG tablet Take 37.5 mg by  mouth daily.   Yes Historical Provider, MD  ALPRAZolam Duanne Moron) 0.25 MG tablet Take 0.25 mg by mouth daily as needed for anxiety.     Historical Provider, MD  B Complex-C-Folic Acid (MULTIVITAMIN, STRESS FORMULA) tablet Take 1 tablet by mouth daily.      Historical Provider, MD  buPROPion (WELLBUTRIN SR) 200 MG 12 hr tablet Take 200 mg by mouth daily.     Historical Provider, MD  calcium carbonate (OS-CAL) 600 MG TABS Take 600 mg by mouth daily.     Historical Provider, MD  Canagliflozin (INVOKANA) 300 MG TABS Take 1 tablet by mouth daily.     Historical Provider, MD  cholecalciferol (VITAMIN D) 1000 UNITS tablet Take 1,000 Units by mouth daily.      Historical Provider, MD  docusate sodium (COLACE) 100 MG capsule Take 100 mg by mouth daily as needed. Constipation    Historical Provider, MD  ferrous sulfate 325 (65 FE) MG tablet Take 325 mg by mouth daily with breakfast.    Historical Provider, MD  hydrochlorothiazide 25 MG tablet Take 25 mg by mouth as needed. fluid    Historical Provider, MD  HYDROcodone-acetaminophen (NORCO/VICODIN) 5-325 MG per tablet Take 1 tablet by mouth every 6 (six) hours as needed. pain 04/21/12   Historical Provider, MD  ibuprofen (ADVIL,MOTRIN) 800 MG tablet Take 800 mg by mouth every 8 (eight) hours as needed. pain    Historical Provider, MD  insulin glargine (LANTUS) 100 UNIT/ML injection Inject 30 Units into the skin daily.     Historical Provider, MD  levothyroxine (SYNTHROID, LEVOTHROID) 50 MCG tablet Take 50 mcg by mouth daily.      Historical Provider, MD  Liraglutide (VICTOZA) 18 MG/3ML SOLN Inject 1.8 mLs into the skin daily.     Historical Provider, MD  metFORMIN (GLUCOPHAGE) 1000 MG tablet Take 1,000 mg by mouth daily with breakfast.     Historical Provider, MD  Multiple Vitamins-Minerals (WOMENS DAILY FORMULA PO) Take by mouth daily.    Historical Provider, MD  vitamin E 1000 UNIT capsule Take 400 Units by mouth daily.     Historical Provider, MD  zolpidem  (AMBIEN) 10 MG tablet Take 10 mg by mouth at bedtime as needed for sleep.     Historical Provider, MD    Allergies as of 04/21/2013 - Review Complete 12/12/2012  Allergen Reaction Noted  . Darvocet [propoxyphene n-acetaminophen] Other (See Comments) 07/12/2010  . Tape Hives 01/05/2011    Family History  Problem Relation Age of Onset  . Anesthesia problems Neg Hx   . Hypotension Neg Hx   . Malignant hyperthermia Neg Hx   . Pseudochol deficiency Neg Hx   . Colon cancer Neg Hx     History   Social History  . Marital Status: Single    Spouse Name: N/A    Number of Children: N/A  . Years of Education: N/A   Occupational History  . Not on file.   Social History Main Topics  . Smoking status: Never Smoker   . Smokeless tobacco: Never Used  . Alcohol Use: Yes     Comment: Occasional  . Drug Use: No  . Sexual Activity: Not on file   Other Topics Concern  . Not on file   Social History Narrative  . No narrative on file    Review of Systems: See HPI, otherwise negative ROS   Physical Exam: BP 147/80  Pulse 72  Temp(Src) 98.6 F (37 C) (Oral)  Resp 18  Ht '5\' 10"'  (1.778 m)  Wt 273 lb (123.832 kg)  BMI 39.17 kg/m2  SpO2 95% General:   Alert,  pleasant and cooperative in NAD Head:  Normocephalic and atraumatic. Neck:  Supple; Lungs:  Clear throughout to auscultation.    Heart:  Regular rate and rhythm. Abdomen:  Soft, nontender and nondistended. Normal bowel sounds, without guarding, and without rebound.   Neurologic:  Alert and  oriented x4;  grossly normal neurologically.  Impression/Plan:     SCREENING  Plan:  1. TCS TODAY

## 2013-05-07 NOTE — Op Note (Signed)
[facility] [address] [place], [address]   COLONOSCOPY PROCEDURE REPORT  PATIENT: Kelly Jacobson, Kelly Jacobson  MR#: [id number] BIRTHDATE: [birth year] , 52  yrs. old GENDER: Female ENDOSCOPIST: Barney Drain, MD REFERRED [id number] Dione Housekeeper, M.D. PROCEDURE DATE:  05/07/2013 PROCEDURE:   Colonoscopy, screening  WITH ENDOCUFF INDICATIONS:Average risk patient for colon cancer. MEDICATIONS: Promethazine (Phenergan) 12.5mg  IV, Demerol 125 mg IV, and Versed 7 mg IV  DESCRIPTION OF PROCEDURE:    Physical exam was performed.  Informed consent was obtained from the patient after explaining the benefits, risks, and alternatives to procedure.  The patient was connected to monitor and placed in left lateral position. Continuous oxygen was provided by nasal cannula and IV medicine administered through an indwelling cannula.  After administration of sedation and rectal exam, the patients rectum was intubated and the EC-3890Li ([id number])  colonoscope was advanced under direct visualization to the cecum.  The scope was removed slowly by carefully examining the color, texture, anatomy, and integrity mucosa on the way out.  The patient was recovered in endoscopy and discharged home in satisfactory condition.    COLON FINDINGS: There was mild diverticulosis noted in the ascending colon, descending colon, and sigmoid colon with associated tortuosity and muscular hypertrophy.  , A normal appearing cecum, ileocecal valve, and appendiceal orifice were identified.  The ascending, hepatic flexure, transverse, splenic flexure, descending, sigmoid colon and rectum appeared unremarkable.  No polyps or cancers were seen.  , Small internal hemorrhoids were found.  , and The LEFT colon IS redundant.  Manual abdominal counter-pressure was used to reach the cecum.  PREP QUALITY: good.  CECAL W/D TIME: 15 minutes     COMPLICATIONS: None  ENDOSCOPIC IMPRESSION: 1.   Mild diverticulosis in the  ascending colon, descending colon, and sigmoid colon 2.   Normal colon 3.   Small internal hemorrhoids  RECOMMENDATIONS: HIGH FIBER DIET LOSE WEIGHT TCS IN 10 YEARS WITH OVERTUBE       _______________________________ eSignedBarney Drain, MD 05/07/2013 10:46 AM

## 2013-05-07 NOTE — Discharge Instructions (Signed)
You have internal hemorrhoids. YOU DID NOT HAVE ANY POLYPS.   CONTINUE YOUR WEIGHT LOSS EFFORTS. OBESITY INCREASES YOUR RISK FOR ALL CANCER, ESPECIALLY COLON CANCER.  DO NOT DRINK SWEET TEA, SODAS, KOOL-AID, OR GREEN TEA. DRINK WATER TO KEEP YOUR URINE LIGHT YELLOW.  FOLLOW A LOW FAT/HIGH FIBER DIET. AVOID ITEMS THAT CAUSE BLOATING. SEE INFO BELOW.  USE PREPARATION H AS NEEDED FOR RECTAL PAIN, PRESSURE, ITCHING, OR BLEEDING  Next colonoscopy in 10 years.   Colonoscopy Care After Read the instructions outlined below and refer to this sheet in the next week. These discharge instructions provide you with general information on caring for yourself after you leave the hospital. While your treatment has been planned according to the most current medical practices available, unavoidable complications occasionally occur. If you have any problems or questions after discharge, call DR. Paylin Hailu, [phone].  ACTIVITY  You may resume your regular activity, but move at a slower pace for the next 24 hours.   Take frequent rest periods for the next 24 hours.   Walking will help get rid of the air and reduce the bloated feeling in your belly (abdomen).   No driving for 24 hours (because of the medicine (anesthesia) used during the test).   You may shower.   Do not sign any important legal documents or operate any machinery for 24 hours (because of the anesthesia used during the test).    NUTRITION  Drink plenty of fluids.   You may resume your normal diet as instructed by your doctor.   Begin with a light meal and progress to your normal diet. Heavy or fried foods are harder to digest and may make you feel sick to your stomach (nauseated).   Avoid alcoholic beverages for 24 hours or as instructed.    MEDICATIONS  You may resume your normal medications.   WHAT YOU CAN EXPECT TODAY  Some feelings of bloating in the abdomen.   Passage of more gas than usual.   Spotting of blood  in your stool or on the toilet paper  .  IF YOU HAD POLYPS REMOVED DURING THE COLONOSCOPY:  Eat a soft diet IF YOU HAVE NAUSEA, BLOATING, ABDOMINAL PAIN, OR VOMITING.    FINDING OUT THE RESULTS OF YOUR TEST Not all test results are available during your visit. DR. Oneida Alar WILL CALL YOU WITHIN 7 DAYS OF YOUR PROCEDUE WITH YOUR RESULTS. Do not assume everything is normal if you have not heard from DR. Kele Barthelemy IN ONE WEEK, CALL HER OFFICE AT [phone].  SEEK IMMEDIATE MEDICAL ATTENTION AND CALL THE OFFICE: [phone] IF:  You have more than a spotting of blood in your stool.   Your belly is swollen (abdominal distention).   You are nauseated or vomiting.   You have a temperature over 101F.   You have abdominal pain or discomfort that is severe or gets worse throughout the day.  High-Fiber Diet A high-fiber diet changes your normal diet to include more whole grains, legumes, fruits, and vegetables. Changes in the diet involve replacing refined carbohydrates with unrefined foods. The calorie level of the diet is essentially unchanged. The Dietary Reference Intake (recommended amount) for adult males is 38 grams per day. For adult females, it is 25 grams per day. Pregnant and lactating women should consume 28 grams of fiber per day. Fiber is the intact part of a plant that is not broken down during digestion. Functional fiber is fiber that has been isolated from the plant to provide a  beneficial effect in the body. PURPOSE  Increase stool bulk.   Ease and regulate bowel movements.   Lower cholesterol.  INDICATIONS THAT YOU NEED MORE FIBER  Constipation and hemorrhoids.   Uncomplicated diverticulosis (intestine condition) and irritable bowel syndrome.   Weight management.   As a protective measure against hardening of the arteries (atherosclerosis), diabetes, and cancer.   GUIDELINES FOR INCREASING FIBER IN THE DIET  Start adding fiber to the diet slowly. A gradual increase  of about 5 more grams (2 slices of whole-wheat bread, 2 servings of most fruits or vegetables, or 1 bowl of high-fiber cereal) per day is best. Too rapid an increase in fiber may result in constipation, flatulence, and bloating.   Drink enough water and fluids to keep your urine clear or pale yellow. Water, juice, or caffeine-free drinks are recommended. Not drinking enough fluid may cause constipation.   Eat a variety of high-fiber foods rather than one type of fiber.   Try to increase your intake of fiber through using high-fiber foods rather than fiber pills or supplements that contain small amounts of fiber.   The goal is to change the types of food eaten. Do not supplement your present diet with high-fiber foods, but replace foods in your present diet.  INCLUDE A VARIETY OF FIBER SOURCES  Replace refined and processed grains with whole grains, canned fruits with fresh fruits, and incorporate other fiber sources. White rice, white breads, and most bakery goods contain little or no fiber.   Brown whole-grain rice, buckwheat oats, and many fruits and vegetables are all good sources of fiber. These include: broccoli, Brussels sprouts, cabbage, cauliflower, beets, sweet potatoes, white potatoes (skin on), carrots, tomatoes, eggplant, squash, berries, fresh fruits, and dried fruits.   Cereals appear to be the richest source of fiber. Cereal fiber is found in whole grains and bran. Bran is the fiber-rich outer coat of cereal grain, which is largely removed in refining. In whole-grain cereals, the bran remains. In breakfast cereals, the largest amount of fiber is found in those with "bran" in their names. The fiber content is sometimes indicated on the label.   You may need to include additional fruits and vegetables each day.   In baking, for 1 cup white flour, you may use the following substitutions:   1 cup whole-wheat flour minus 2 tablespoons.   1/2 cup white flour plus 1/2 cup whole-wheat  flour.   Low-Fat Diet BREADS, CEREALS, PASTA, RICE, DRIED PEAS, AND BEANS These products are high in carbohydrates and most are low in fat. Therefore, they can be increased in the diet as substitutes for fatty foods. They too, however, contain calories and should not be eaten in excess. Cereals can be eaten for snacks as well as for breakfast.   FRUITS AND VEGETABLES It is good to eat fruits and vegetables. Besides being sources of fiber, both are rich in vitamins and some minerals. They help you get the daily allowances of these nutrients. Fruits and vegetables can be used for snacks and desserts.  MEATS Limit lean meat, chicken, [place], and fish to no more than 6 ounces per day. Beef, Pork, and Lamb Use lean cuts of beef, pork, and lamb. Lean cuts include:  Extra-lean ground beef.  Arm roast.  Sirloin tip.  Center-cut ham.  Round steak.  Loin chops.  Rump roast.  Tenderloin.  Trim all fat off the outside of meats before cooking. It is not necessary to severely decrease the intake of red meat,  but lean choices should be made. Lean meat is rich in protein and contains a highly absorbable form of iron. Premenopausal women, in particular, should avoid reducing lean red meat because this could increase the risk for low red blood cells (iron-deficiency anemia).  Chicken and [place] These are good sources of protein. The fat of poultry can be reduced by removing the skin and underlying fat layers before cooking. Chicken and [place] can be substituted for lean red meat in the diet. Poultry should not be fried or covered with high-fat sauces. Fish and Shellfish Fish is a good source of protein. Shellfish contain cholesterol, but they usually are low in saturated fatty acids. The preparation of fish is important. Like chicken and [place], they should not be fried or covered with high-fat sauces. EGGS Egg whites contain no fat or cholesterol. They can be eaten often. Try 1 to 2 egg whites instead of  whole eggs in recipes or use egg substitutes that do not contain yolk. MILK AND DAIRY PRODUCTS Use skim or 1% milk instead of 2% or whole milk. Decrease whole milk, natural, and processed cheeses. Use nonfat or low-fat (2%) cottage cheese or low-fat cheeses made from vegetable oils. Choose nonfat or low-fat (1 to 2%) yogurt. Experiment with evaporated skim milk in recipes that call for heavy cream. Substitute low-fat yogurt or low-fat cottage cheese for sour cream in dips and salad dressings. Have at least 2 servings of low-fat dairy products, such as 2 glasses of skim (or 1%) milk each day to help get your daily calcium intake. FATS AND OILS Reduce the total intake of fats, especially saturated fat. Butterfat, lard, and beef fats are high in saturated fat and cholesterol. These should be avoided as much as possible. Vegetable fats do not contain cholesterol, but certain vegetable fats, such as coconut oil, palm oil, and palm kernel oil are very high in saturated fats. These should be limited. These fats are often used in bakery goods, processed foods, popcorn, oils, and nondairy creamers. Vegetable shortenings and some peanut butters contain hydrogenated oils, which are also saturated fats. Read the labels on these foods and check for saturated vegetable oils. Unsaturated vegetable oils and fats do not raise blood cholesterol. However, they should be limited because they are fats and are high in calories. Total fat should still be limited to 30% of your daily caloric intake. Desirable liquid vegetable oils are corn oil, cottonseed oil, olive oil, canola oil, safflower oil, soybean oil, and sunflower oil. Peanut oil is not as good, but small amounts are acceptable. Buy a heart-healthy tub margarine that has no partially hydrogenated oils in the ingredients. Mayonnaise and salad dressings often are made from unsaturated fats, but they should also be limited because of their high calorie and fat content. Seeds,  nuts, peanut butter, olives, and avocados are high in fat, but the fat is mainly the unsaturated type. These foods should be limited mainly to avoid excess calories and fat. OTHER EATING TIPS Snacks  Most sweets should be limited as snacks. They tend to be rich in calories and fats, and their caloric content outweighs their nutritional value. Some good choices in snacks are graham crackers, melba toast, soda crackers, bagels (no egg), English muffins, fruits, and vegetables. These snacks are preferable to snack crackers, [place] fries, TORTILLA CHIPS, and POTATO chips. Popcorn should be air-popped or cooked in small amounts of liquid vegetable oil. Desserts Eat fruit, low-fat yogurt, and fruit ices instead of pastries, cake, and cookies. Sherbet,  angel food cake, gelatin dessert, frozen low-fat yogurt, or other frozen products that do not contain saturated fat (pure fruit juice bars, frozen ice pops) are also acceptable.  COOKING METHODS Choose those methods that use little or no fat. They include: Poaching.  Braising.  Steaming.  Grilling.  Baking.  Stir-frying.  Broiling.  Microwaving.  Foods can be cooked in a nonstick pan without added fat, or use a nonfat cooking spray in regular cookware. Limit fried foods and avoid frying in saturated fat. Add moisture to lean meats by using water, broth, cooking wines, and other nonfat or low-fat sauces along with the cooking methods mentioned above. Soups and stews should be chilled after cooking. The fat that forms on top after a few hours in the refrigerator should be skimmed off. When preparing meals, avoid using excess salt. Salt can contribute to raising blood pressure in some people.  EATING AWAY FROM HOME Order entres, potatoes, and vegetables without sauces or butter. When meat exceeds the size of a deck of cards (3 to 4 ounces), the rest can be taken home for another meal. Choose vegetable or fruit salads and ask for low-calorie salad dressings  to be served on the side. Use dressings sparingly. Limit high-fat toppings, such as bacon, crumbled eggs, cheese, sunflower seeds, and olives. Ask for heart-healthy tub margarine instead of butter.    Hemorrhoids Hemorrhoids are dilated (enlarged) veins around the rectum. Sometimes clots will form in the veins. This makes them swollen and painful. These are called thrombosed hemorrhoids. Causes of hemorrhoids include:  Constipation.   Straining to have a bowel movement.   HEAVY LIFTING HOME CARE INSTRUCTIONS  Eat a well balanced diet and drink 6 to 8 glasses of water every day to avoid constipation. You may also use a bulk laxative.   Avoid straining to have bowel movements.   Keep anal area dry and clean.   Do not use a donut shaped pillow or sit on the toilet for long periods. This increases blood pooling and pain.   Move your bowels when your body has the urge; this will require less straining and will decrease pain and pressure.

## 2013-05-08 ENCOUNTER — Encounter (HOSPITAL_COMMUNITY): Payer: Self-pay | Admitting: Gastroenterology

## 2013-06-09 NOTE — Progress Notes (Signed)
[name], MD [place] [id number]  Breast cancer - Plan: CBC with Differential, Comprehensive metabolic panel, CEA, Cancer antigen 27.29  Infiltrating ductal carcinoma of breast  Preventative health care - Plan: Lipid panel  CURRENT THERAPY: Anastrozole 1 mg dailyin beginning on 12/22/2009  INTERVAL HISTORY: Kelly Jacobson 53 y.o. female returns for  regular  visit for followup of stage II right breast cancer while taking anastrozole 1 mg daily treated in the past with lumpectomy, axillary dissection, 4 cycles of epirubicin and Cytoxan followed by Taxol x12 followed by adjuvant radiotherapy with anastrozole started on 12/22/2009.  Oncology History   Stage II (T1c, N1), grade 2 of right breast presenting in upper axilla with mass (actually superior portion of right arm).  1.9 cm mass with 3/26 positive nodes with extracapsular extension.  Both mets in lymph nodes were greater than 2 mm.  Negative margins.  Initial biopsy on 01/25/2009 with definitive surgery and lymph node dissection on 02/23/2009 followed by re-excision on 03/16/2009 with all margins clear.  ER 52%, PR 36%, Ki-67 33% Her2 negative.  S/P EC in dose dense fashion x 4 cycles followed by weekly Taxol.  S/P radiation Finishing in December 2011.  Now on Arimidex starting on 12/22/2009.     Infiltrating ductal carcinoma of breast   01/18/2009 Initial Diagnosis Simple excision of right axilla demonstrating high grade DCIS and invasive moderately  differentiated ductal carcinoma with positive rection margins.  ER 62%, PR 36%, Ki-67 33%, Her 2 negative   02/23/2009 Surgery Right breast lumpectomy with sentinel node and axillary dissection demonstrating a 0.3 cm DCIS involving deep margin, + LVI, 3/26 positive lymph nodes   03/16/2009 Surgery Re-excision of margins with negative disease   05/10/2009 - 06/23/2009 Chemotherapy Epirubicin, Cytoxan x 4 cycles   07/06/2009 - 09/21/2009 Chemotherapy Taxol x 12 cycles   10/17/2009 - 12/07/2009 Radiation Therapy    12/22/2009 -  Chemotherapy Anastrozole and she will take for 7-10 years (at least)   I personally reviewed and went over laboratory results with the patient.  The results are noted within this dictation.  We had a discussion regarding the length of therapy of AI given her young age.  The current recommendation is 5 years worth of AI therapy.  I do not have data to present that favors longer therapy.  With that being said, one would argue that pursuing longer therapy (including lifelong AI therapy) could be of benefit.  I spent time explaining this information to Edroy and I have deferred the decision to her.   We spent some time discussing her weight and she is going to consider gastric bypass surgery.  She has a consultation/conference regarding this procedure upcoming.  I think she is a good candidate for this procedure given her obesity.  She reports that she has been fasting and she requests lipid panel be performed with her labs today.  I will order this and fax the results to her primary care provider to address accordingly.   Oncologically, she denies any complaints and ROS questioning is negative.   Past Medical History  Diagnosis Date  . Diabetes mellitus   . Thyroid disease   . DM (diabetes mellitus) 11/20/2010  . Hot flashes secondary to Arimidex 11/20/2010  . Breast cancer     right  . Hypothyroidism 11/20/2010    Synthroid  . Edema     hands and feet at times, takes as a direutic  . Depression  wellburtin  . Arthritis     Hands and legs  . Anxiety     Xanax  . Varicose veins 2014    has Infiltrating ductal carcinoma of breast; Hypothyroidism; DM (diabetes mellitus); and Hot flashes secondary to Arimidex on her problem list.     is allergic to darvocet and tape.  Ms. Radziewicz does not currently have medications on file.  Past Surgical History  Procedure Laterality Date  . Breast reduction surgery  1991  . Carpal  tunnel release  04/06/1998  . Abdominal hysterectomy  2001    vaginal for endometriosis  . Debridement tennis elbow  2003  . Cervical fusion  01/15/2007  . Axillary cyst  01/25/2009  . Axillary node dissection  02/23/2009 and 03/16/2009  . Bilateral oophorectomy  04/19/2009  . Portacath placement  05/02/2009  . Trigger thumb surgery  02/01/2010  . Rectocele surgery  03/07/10  . Port-a-cath removal  01/16/2011    Procedure: REMOVAL PORT-A-CATH;  Surgeon: Stark Klein, MD;  Location: [facility];  Service: General;  Laterality: N/A;  Removal port-a-cath.  . Carpal tunnel release  [id number]    lt hand  . Colonoscopy N/A 05/07/2013    Procedure: COLONOSCOPY;  Surgeon: Danie Binder, MD;  Location: [facility];  Service: Endoscopy;  Laterality: N/A;  8:30 AM    Denies any headaches, dizziness, double vision, fevers, chills, night sweats, nausea, vomiting, diarrhea, constipation, chest pain, heart palpitations, shortness of breath, blood in stool, black tarry stool, urinary pain, urinary burning, urinary frequency, hematuria.   PHYSICAL EXAMINATION  ECOG PERFORMANCE STATUS: 0 - Asymptomatic  Filed Vitals:   06/12/13 0900  BP: 134/87  Pulse: 74  Temp: 97.8 F (36.6 C)  Resp: 18    GENERAL:alert, no distress, well nourished, well developed, comfortable, cooperative, obese and smiling SKIN: skin color, texture, turgor are normal, no rashes or significant lesions HEAD: Normocephalic, No masses, lesions, tenderness or abnormalities EYES: normal, PERRLA, EOMI, Conjunctiva are pink and non-injected EARS: External ears normal OROPHARYNX:mucous membranes are moist  NECK: supple, no adenopathy, trachea midline LYMPH:  not examined BREAST:not examined LUNGS: clear to auscultation and percussion HEART: regular rate & rhythm, no murmurs and no gallops ABDOMEN:abdomen soft, non-tender, obese and normal bowel sounds BACK: Back symmetric, no curvature. EXTREMITIES:less then 2 second capillary refill, no joint  deformities, effusion, or inflammation, no skin discoloration, no clubbing, no cyanosis  NEURO: alert & oriented x 3 with fluent speech, no focal motor/sensory deficits, gait normal   LABORATORY DATA: CBC    Component Value Date/Time   WBC 10.8* 12/12/2012 0945   RBC 5.33* 12/12/2012 0945   HGB 14.5 12/12/2012 0945   HCT 45.4 12/12/2012 0945   PLT 305 12/12/2012 0945   MCV 85.2 12/12/2012 0945   MCH 27.2 12/12/2012 0945   MCHC 31.9 12/12/2012 0945   RDW 14.0 12/12/2012 0945   LYMPHSABS 1.5 12/12/2012 0945   MONOABS 0.6 12/12/2012 0945   EOSABS 0.3 12/12/2012 0945   BASOSABS 0.0 12/12/2012 0945      Chemistry      Component Value Date/Time   NA 139 12/12/2012 0945   K 4.3 12/12/2012 0945   CL 100 12/12/2012 0945   CO2 29 12/12/2012 0945   BUN 19 12/12/2012 0945   CREATININE 0.78 12/12/2012 0945      Component Value Date/Time   CALCIUM 10.4 12/12/2012 0945   ALKPHOS 105 12/12/2012 0945   AST 34 12/12/2012 0945   ALT 24 12/12/2012 0945  BILITOT 0.7 12/12/2012 0945     Lab Results  Component Value Date   LABCA2 13 12/12/2012      RADIOGRAPHIC STUDIES:  12/09/2012  CLINICAL DATA: Patient underwent right lumpectomy and chemotherapy  for breast cancer in 2011.  EXAM:  DIGITAL DIAGNOSTIC BILATERAL MAMMOGRAM WITH CAD  DIGITAL BREAST TOMOSYNTHESIS  Digital breast tomosynthesis images are acquired in two projections.  These images are reviewed in combination with the digital mammogram,  confirming the findings below.  COMPARISON: Prior studies dating back to 08/09/2008 from [facility]  ACR Breast Density Category b: There are scattered areas of  fibroglandular density.  FINDINGS:  Reduction mammoplasty changes are present. There is no suspicious  dominant mass, nonsurgical architectural distortion, or  calcification to suggest malignancy.  Mammographic images were processed with CAD.  IMPRESSION:  No mammographic evidence of malignancy.  RECOMMENDATION:  Yearly  diagnostic mammography is suggested.  I have discussed the findings and recommendations with the patient.  Results were also provided in writing at the conclusion of the  visit. If applicable, a reminder letter will be sent to the patient  regarding the next appointment.  BI-RADS CATEGORY 2: Benign Finding(s)  Electronically Signed  By: Ulyess Blossom M.D.  On: 12/09/2012 09:59     ASSESSMENT:  1. Stage II right breast cancer while taking anastrozole 1 mg daily treated in the past with lumpectomy, axillary dissection, 4 cycles of epirubicin and Cytoxan followed by Taxol x12 followed by adjuvant radiotherapy with anastrozole started on 12/22/2009. 2. Diabetes mellitus, type II, non-insulin requiring, controlled with voluntary weight loss and medication.  3. Hypothyroidism, on treatment.  4. 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 5. Obesity  Patient Active Problem List   Diagnosis Date Noted  . Hypothyroidism 11/20/2010  . DM (diabetes mellitus) 11/20/2010  . Hot flashes secondary to Arimidex 11/20/2010  . Infiltrating ductal carcinoma of breast 09/06/2010     PLAN:  1. I personally reviewed and went over laboratory results with the patient.  The results are noted within this dictation. 2. I personally reviewed and went over radiographic studies with the patient.  The results are noted within this dictation.   3. Next screening mammogram is due in December 2015 4. Labs today as ordered: CBC diff, CMET, CEA, CA 27.29, lipid panel. 5. Will fax labs to Primary Care Provider once results are reported 6. Encourage consideration of bariatric surgery 7. Discussion regarding the length of endocrine therapy (5 versus 10 versus lifelong).  8. She needs a bone density in the future and this will be discussed on her return visit 9. Return in 6 months for follow-up   THERAPY PLAN:  She has been planning on taking an AI for 7-10 years per Dr. Jaclyn Prime  recommendation in the past.  This is reasonable and the question will remain if continuing past 10 years is beneficial.  She needs a bone density in the future.  NCCN guidelines recommends the following surveillance for invasive breast cancer:  A. History and Physical exam every 4-6 months for 5 years and then every 12 months.  B. Mammography every 12 months  C. Women on Tamoxifen: annual gynecologic assessment every 12 months if uterus is present.  D. Women on aromatase inhibitor or who experience ovarian failure secondary to treatment should have monitoring of bone health with a bone mineral density determination at baseline and periodically thereafter.  E. Assess and encourage adherence to adjuvant endocrine therapy.  F. Evidence suggests that active  lifestyle and achieving and maintaining an ideal body weight (20-25 BMI) may lead to optimal breast cancer outcomes.  All questions were answered. The patient knows to call the clinic with any problems, questions or concerns. We can certainly see the patient much sooner if necessary.  Patient and plan discussed with Dr. Farrel Gobble and he is in agreement with the aforementioned.   [facility] 06/12/2013

## 2013-06-12 ENCOUNTER — Encounter (HOSPITAL_COMMUNITY): Payer: Self-pay | Admitting: Oncology

## 2013-06-12 ENCOUNTER — Encounter (HOSPITAL_BASED_OUTPATIENT_CLINIC_OR_DEPARTMENT_OTHER): Payer: BC Managed Care – PPO

## 2013-06-12 ENCOUNTER — Encounter (HOSPITAL_COMMUNITY): Payer: BC Managed Care – PPO | Attending: Oncology | Admitting: Oncology

## 2013-06-12 VITALS — BP 134/87 | HR 74 | Temp 97.8°F | Resp 18 | Wt 276.5 lb

## 2013-06-12 DIAGNOSIS — E669 Obesity, unspecified: Secondary | ICD-10-CM | POA: Insufficient documentation

## 2013-06-12 DIAGNOSIS — C773 Secondary and unspecified malignant neoplasm of axilla and upper limb lymph nodes: Secondary | ICD-10-CM

## 2013-06-12 DIAGNOSIS — Z79899 Other long term (current) drug therapy: Secondary | ICD-10-CM | POA: Insufficient documentation

## 2013-06-12 DIAGNOSIS — C50619 Malignant neoplasm of axillary tail of unspecified female breast: Secondary | ICD-10-CM

## 2013-06-12 DIAGNOSIS — Z9889 Other specified postprocedural states: Secondary | ICD-10-CM | POA: Insufficient documentation

## 2013-06-12 DIAGNOSIS — Z0000 Encounter for general adult medical examination without abnormal findings: Secondary | ICD-10-CM

## 2013-06-12 DIAGNOSIS — C50919 Malignant neoplasm of unspecified site of unspecified female breast: Secondary | ICD-10-CM

## 2013-06-12 DIAGNOSIS — R55 Syncope and collapse: Secondary | ICD-10-CM | POA: Insufficient documentation

## 2013-06-12 DIAGNOSIS — E039 Hypothyroidism, unspecified: Secondary | ICD-10-CM

## 2013-06-12 DIAGNOSIS — E119 Type 2 diabetes mellitus without complications: Secondary | ICD-10-CM

## 2013-06-12 DIAGNOSIS — Z9221 Personal history of antineoplastic chemotherapy: Secondary | ICD-10-CM | POA: Insufficient documentation

## 2013-06-12 DIAGNOSIS — D0590 Unspecified type of carcinoma in situ of unspecified breast: Secondary | ICD-10-CM | POA: Insufficient documentation

## 2013-06-12 DIAGNOSIS — Z923 Personal history of irradiation: Secondary | ICD-10-CM | POA: Insufficient documentation

## 2013-06-12 LAB — CBC WITH DIFFERENTIAL/PLATELET
Basophils Absolute: 0 10*3/uL (ref 0.0–0.1)
Basophils Relative: 0 % (ref 0–1)
EOS ABS: 0.2 10*3/uL (ref 0.0–0.7)
EOS PCT: 3 % (ref 0–5)
HEMATOCRIT: 41.8 % (ref 36.0–46.0)
HEMOGLOBIN: 13.7 g/dL (ref 12.0–15.0)
LYMPHS ABS: 1.4 10*3/uL (ref 0.7–4.0)
Lymphocytes Relative: 16 % (ref 12–46)
MCH: 27.9 pg (ref 26.0–34.0)
MCHC: 32.8 g/dL (ref 30.0–36.0)
MCV: 85.1 fL (ref 78.0–100.0)
MONO ABS: 0.5 10*3/uL (ref 0.1–1.0)
MONOS PCT: 6 % (ref 3–12)
Neutro Abs: 6.2 10*3/uL (ref 1.7–7.7)
Neutrophils Relative %: 75 % (ref 43–77)
Platelets: 258 10*3/uL (ref 150–400)
RBC: 4.91 MIL/uL (ref 3.87–5.11)
RDW: 13.8 % (ref 11.5–15.5)
WBC: 8.3 10*3/uL (ref 4.0–10.5)

## 2013-06-12 LAB — LIPID PANEL
Cholesterol: 127 mg/dL (ref 0–200)
HDL: 46 mg/dL (ref >39)
LDL CALC: 65 mg/dL (ref 0–99)
Total CHOL/HDL Ratio: 2.8 RATIO
Triglycerides: 82 mg/dL (ref <150)
VLDL: 16 mg/dL (ref 0–40)

## 2013-06-12 LAB — COMPREHENSIVE METABOLIC PANEL
ALT: 26 U/L (ref 0–35)
AST: 39 U/L — ABNORMAL HIGH (ref 0–37)
Albumin: 4.1 g/dL (ref 3.5–5.2)
Alkaline Phosphatase: 103 U/L (ref 39–117)
BILIRUBIN TOTAL: 0.8 mg/dL (ref 0.3–1.2)
BUN: 16 mg/dL (ref 6–23)
CHLORIDE: 103 meq/L (ref 96–112)
CO2: 27 mEq/L (ref 19–32)
CREATININE: 0.65 mg/dL (ref 0.50–1.10)
Calcium: 9.8 mg/dL (ref 8.4–10.5)
GFR calc non Af Amer: >90 mL/min (ref >90)
GLUCOSE: 151 mg/dL — AB (ref 70–99)
Potassium: 4.2 mEq/L (ref 3.7–5.3)
Sodium: 143 mEq/L (ref 137–147)
Total Protein: 7.3 g/dL (ref 6.0–8.3)

## 2013-06-12 NOTE — Patient Instructions (Signed)
[facility] Discharge Instructions  RECOMMENDATIONS MADE BY THE CONSULTANT AND ANY TEST RESULTS WILL BE SENT TO YOUR REFERRING PHYSICIAN.  We will see you back in 6 months for a doctor's appointment. Please call for any questions or concerns.   Thank you for choosing [facility] to provide your oncology and hematology care.  To afford each patient quality time with our providers, please arrive at least 15 minutes before your scheduled appointment time.  With your help, our goal is to use those 15 minutes to complete the necessary work-up to ensure our physicians have the information they need to help with your evaluation and healthcare recommendations.    Effective January 1st, 2014, we ask that you re-schedule your appointment with our physicians should you arrive 10 or more minutes late for your appointment.  We strive to give you quality time with our providers, and arriving late affects you and other patients whose appointments are after yours.    Again, thank you for choosing [facility].  Our hope is that these requests will decrease the amount of time that you wait before being seen by our physicians.       _____________________________________________________________  Should you have questions after your visit to [facility], please contact our office at [phone] between the hours of 8:30 a.m. and 5:00 p.m.  Voicemails left after 4:30 p.m. will not be returned until the following business day.  For prescription refill requests, have your pharmacy contact our office with your prescription refill request.

## 2013-06-12 NOTE — Progress Notes (Signed)
LABS DRAWN FOR CBCD, CMP, CEA, CA27.29, LIPID PANEL.

## 2013-06-13 LAB — CANCER ANTIGEN 27.29: CA 27.29: 8 U/mL (ref 0–39)

## 2013-06-13 LAB — CEA: CEA: 0.8 ng/mL (ref 0.0–5.0)

## 2013-11-03 ENCOUNTER — Other Ambulatory Visit: Payer: Self-pay | Admitting: Internal Medicine

## 2013-11-03 ENCOUNTER — Other Ambulatory Visit (HOSPITAL_COMMUNITY): Payer: Self-pay | Admitting: Oncology

## 2013-11-03 DIAGNOSIS — Z853 Personal history of malignant neoplasm of breast: Secondary | ICD-10-CM

## 2013-12-11 ENCOUNTER — Ambulatory Visit (HOSPITAL_COMMUNITY): Payer: BC Managed Care – PPO | Admitting: Oncology

## 2013-12-11 ENCOUNTER — Ambulatory Visit
Admission: RE | Admit: 2013-12-11 | Discharge: 2013-12-11 | Disposition: A | Discharge status: Home / Self Care | Payer: BC Managed Care – PPO | Source: Ambulatory Visit | Attending: Internal Medicine | Admitting: Internal Medicine

## 2013-12-11 DIAGNOSIS — Z853 Personal history of malignant neoplasm of breast: Secondary | ICD-10-CM

## 2013-12-17 NOTE — Progress Notes (Signed)
Kelly Redwood, MD Kelly Jacobson [id number]  Infiltrating ductal carcinoma of breast, right - Plan: CBC with Differential, Comprehensive metabolic panel, DG Bone Density, Vitamin D 25 hydroxy, CBC with Differential, Comprehensive metabolic panel  Aromatase inhibitor use - Plan: CBC with Differential, Comprehensive metabolic panel, DG Bone Density, TSH, Lipid panel  Preventative health care - Plan: CBC with Differential, Comprehensive metabolic panel, DG Bone Density  Hypercalcemia - Plan: PTH, intact and calcium  Fatty infiltration of liver - Plan: US Abdomen Limited  CURRENT THERAPY: Anastrozole 1 mg daily beginning on 12/22/2009  INTERVAL HISTORY: Kelly Jacobson 53 y.o. female returns for  regular  visit for followup of stage II right breast cancer while taking anastrozole 1 mg daily treated in the past with lumpectomy, axillary dissection, 4 cycles of epirubicin and Cytoxan followed by Taxol x12 followed by adjuvant radiotherapy with anastrozole started on 12/22/2009.  Oncology History   Stage II (T1c, N1), grade 2 of right breast presenting in upper axilla with mass (actually superior portion of right arm).  1.9 cm mass with 3/26 positive nodes with extracapsular extension.  Both mets in lymph nodes were greater than 2 mm.  Negative margins.  Initial biopsy on 01/25/2009 with definitive surgery and lymph node dissection on 02/23/2009 followed by re-excision on 03/16/2009 with all margins clear.  ER 52%, PR 36%, Ki-67 33% Her2 negative.  S/P EC in dose dense fashion x 4 cycles followed by weekly Taxol.  S/P radiation Finishing in December 2011.  Now on Arimidex starting on 12/22/2009.     Infiltrating ductal carcinoma of breast   01/18/2009 Initial Diagnosis Simple excision of right axilla demonstrating high grade DCIS and invasive moderately  differentiated ductal carcinoma with positive rection margins.  ER 62%, PR 36%, Ki-67 33%, Her 2 negative   02/23/2009 Surgery Right  breast lumpectomy with sentinel node and axillary dissection demonstrating a 0.3 cm DCIS involving deep margin, + LVI, 3/26 positive lymph nodes   03/16/2009 Surgery Re-excision of margins with negative disease   05/10/2009 - 06/23/2009 Chemotherapy Epirubicin, Cytoxan x 4 cycles   07/06/2009 - 09/21/2009 Chemotherapy Taxol x 12 cycles   10/17/2009 - 12/07/2009 Radiation Therapy    12/22/2009 -  Chemotherapy Anastrozole and she will take for 7-10 years (at least)    I personally reviewed and went over laboratory results with the patient.  The results are noted within this dictation.  I personally reviewed and went over radiographic studies with the patient.  The results are noted within this dictation.  Mammogram on 12/11/2013 was BIRADS 2.  She will be due for her next mammogram in Dec 2016.  She continues to tolerate he medications well.  She has not had a documented bone density in [facility] that I can identify.  She reports that she had one performed recently by Dr. Brigitte Jacobson.  We recruited those records and on 07/29/2013 she had a normal bone density exam.  Those records will be scanned into [facility].  She has a number of questions/issues that we addressed: 1. "Will I benefit from continued treatment?"  There is no active data explaining the benefit for continued therapy past 5 years, but we can perform a breast cancer index study to help [place] make that decision in the future.   2. "Thoughts on bariatric surgery?"  We had a long conversation regarding her weight.  She is down 6 lbs since Dec 2014.  I have encouraged her on her continued journey through weight loss.  I have asked her to have more realistic goals (instead of 50 lb weight loss in 1 year as she states).  She is encouraged to continue to increase her activity to help with weight loss. 3. "In the past I had scans that showed a renal lesion.  Is there any need to repeat these?"  I personally reviewed and went over radiographic studies with the patient.  The  results are noted within this dictation.  She had a benign renal cyst in the past and therefore there is no need to re-image this particularly without any symptoms such as hematuria, pelvic pain, etc.  She would like [place] to perform her fasting lipids panel, but she did not fast today.  We will perform them next week with other labs and fax them to Dr. Brigitte Jacobson.  Oncologically, she denies any complaints and ROS questioning is negative.  Past Medical History  Diagnosis Date  . Diabetes mellitus   . Thyroid disease   . DM (diabetes mellitus) 11/20/2010  . Hot flashes secondary to Arimidex 11/20/2010  . Breast cancer     right  . Hypothyroidism 11/20/2010    Synthroid  . Edema     hands and feet at times, takes as a direutic  . Depression     wellburtin  . Arthritis     Hands and legs  . Anxiety     Xanax  . Varicose veins 2014    has Infiltrating ductal carcinoma of breast; Hypothyroidism; DM (diabetes mellitus); and Hot flashes secondary to Arimidex on her problem list.     is allergic to darvocet and tape.  Ms. Kelly Jacobson had no medications administered during this visit.  Past Surgical History  Procedure Laterality Date  . Breast reduction surgery  1991  . Carpal tunnel release  04/06/1998  . Abdominal hysterectomy  2001    vaginal for endometriosis  . Debridement tennis elbow  2003  . Cervical fusion  01/15/2007  . Axillary cyst  01/25/2009  . Axillary node dissection  02/23/2009 and 03/16/2009  . Bilateral oophorectomy  04/19/2009  . Portacath placement  05/02/2009  . Trigger thumb surgery  02/01/2010  . Rectocele surgery  03/07/10  . Port-a-cath removal  01/16/2011    Procedure: REMOVAL PORT-A-CATH;  Surgeon: Stark Klein, MD;  Location: [facility];  Service: General;  Laterality: N/A;  Removal port-a-cath.  . Carpal tunnel release  [id number]    lt hand  . Colonoscopy N/A 05/07/2013    Procedure: COLONOSCOPY;  Surgeon: Danie Binder, MD;  Location[facility];  Service: Endoscopy;   Laterality: N/A;  8:30 AM    Denies any headaches, dizziness, double vision, fevers, chills, night sweats, nausea, vomiting, diarrhea, constipation, chest pain, heart palpitations, shortness of breath, blood in stool, black tarry stool, urinary pain, urinary burning, urinary frequency, hematuria.   PHYSICAL EXAMINATION  ECOG PERFORMANCE STATUS: 0 - Asymptomatic  Filed Vitals:   12/18/13 1433  BP: 141/73  Jacobson: 83  Temp: 97.7 F (36.5 C)  Resp: 20    GENERAL:alert, no distress, well nourished, well developed, comfortable, cooperative, obese and smiling SKIN: skin color, texture, turgor are normal, no rashes or significant lesions HEAD: Normocephalic, No masses, lesions, tenderness or abnormalities EYES: normal, PERRLA, EOMI, Conjunctiva are pink and non-injected EARS: External ears normal OROPHARYNX:mucous membranes are moist  NECK: supple, trachea midline LYMPH:  not examined BREAST:not examined LUNGS: clear to auscultation  HEART: regular rate & rhythm ABDOMEN:abdomen soft, obese and normal bowel sounds BACK: Back  symmetric, no curvature. EXTREMITIES:less then 2 second capillary refill, no joint deformities, effusion, or inflammation, no skin discoloration, no cyanosis  NEURO: alert & oriented x 3 with fluent speech, no focal motor/sensory deficits, gait normal   LABORATORY DATA: CBC    Component Value Date/Time   WBC 8.3 06/12/2013 1051   RBC 4.91 06/12/2013 1051   HGB 13.7 06/12/2013 1051   HCT 41.8 06/12/2013 1051   PLT 258 06/12/2013 1051   MCV 85.1 06/12/2013 1051   MCH 27.9 06/12/2013 1051   MCHC 32.8 06/12/2013 1051   RDW 13.8 06/12/2013 1051   LYMPHSABS 1.4 06/12/2013 1051   MONOABS 0.5 06/12/2013 1051   EOSABS 0.2 06/12/2013 1051   BASOSABS 0.0 06/12/2013 1051      Chemistry      Component Value Date/Time   NA 143 06/12/2013 1051   K 4.2 06/12/2013 1051   CL 103 06/12/2013 1051   CO2 27 06/12/2013 1051   BUN 16 06/12/2013 1051   CREATININE 0.65  06/12/2013 1051      Component Value Date/Time   CALCIUM 9.8 06/12/2013 1051   ALKPHOS 103 06/12/2013 1051   AST 39* 06/12/2013 1051   ALT 26 06/12/2013 1051   BILITOT 0.8 06/12/2013 1051       RADIOGRAPHIC STUDIES:  12/11/2013  CLINICAL DATA: History of malignant lumpectomy of the right breast and 2011. History of bilateral breast reductions. No current complaints.  EXAM: DIGITAL DIAGNOSTIC BILATERAL MAMMOGRAM WITH CAD  COMPARISON: Prior exams  ACR Breast Density Category b: There are scattered areas of fibroglandular density.  FINDINGS: There are no discrete masses. Architectural distortion reflecting postsurgical scarring is noted in the far posterior upper outer right breast, stable. No other architectural distortion. No suspicious calcifications.  Mammographic images were processed with CAD.  IMPRESSION: No evidence of recurrent or new malignancy. Benign postsurgical scarring on the right, stable.  RECOMMENDATION: Diagnostic mammography in 1 year per standard post lumpectomy protocol.  I have discussed the findings and recommendations with the patient. Results were also provided in writing at the conclusion of the visit. If applicable, a reminder letter will be sent to the patient regarding the next appointment.  BI-RADS CATEGORY 2: Benign Finding(s)   Electronically Signed  By: Lajean Manes M.D.  On: 12/11/2013 09:14     ASSESSMENT:  1. Stage II right breast cancer while taking anastrozole 1 mg daily treated in the past with lumpectomy, axillary dissection, 4 cycles of epirubicin and Cytoxan followed by Taxol x12 followed by adjuvant radiotherapy with anastrozole started on 12/22/2009. 2. Diabetes mellitus, type II, non-insulin requiring, controlled with voluntary weight loss and medication.  3. Hypothyroidism, on treatment.  4. 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 5. Obesity 6. Normal bone  density on 07/29/2013 by Dr. Brigitte Jacobson. 7. Fatty infiltration of liver  Patient Active Problem List   Diagnosis Date Noted  . Hypothyroidism 11/20/2010  . DM (diabetes mellitus) 11/20/2010  . Hot flashes secondary to Arimidex 11/20/2010  . Infiltrating ductal carcinoma of breast 09/06/2010    PLAN:  1. I personally reviewed and went over laboratory results with the patient.  The results are noted within this dictation. 2. I personally reviewed and went over radiographic studies with the patient.  The results are noted within this dictation.   3. Next screening mammogram is due in December 2016 4. Labs in near future: CBC diff, CMET, Fasting lipid panel, TSH, Vit D level, PTH 5. Breast Cancer Index testing to help determine the  benefit of continuing AI therapy. 6. [place] of liver in 6 months 7. Return in 6 months for follow-up   THERAPY PLAN:  NCCN guidelines recommends the following surveillance for invasive breast cancer:  A. History and Physical exam every 4-6 months for 5 years and then every 12 months.  B. Mammography every 12 months  C. Women on Tamoxifen: annual gynecologic assessment every 12 months if uterus is present.  D. Women on aromatase inhibitor or who experience ovarian failure secondary to treatment should have monitoring of bone health with a bone mineral density determination at baseline and periodically thereafter.  E. Assess and encourage adherence to adjuvant endocrine therapy.  F. Evidence suggests that active lifestyle and achieving and maintaining an ideal body weight (20-25 BMI) may lead to optimal breast cancer outcomes.   All questions were answered. The patient knows to call the clinic with any problems, questions or concerns. We can certainly see the patient much sooner if necessary.  Patient and plan discussed with Dr. Farrel Gobble and he is in agreement with the aforementioned.   KEFALAS,THOMAS 12/18/2013

## 2013-12-18 ENCOUNTER — Encounter (HOSPITAL_COMMUNITY): Payer: Self-pay | Admitting: Oncology

## 2013-12-18 ENCOUNTER — Encounter (HOSPITAL_COMMUNITY): Payer: BC Managed Care – PPO | Attending: Oncology | Admitting: Oncology

## 2013-12-18 VITALS — BP 141/73 | HR 83 | Temp 97.7°F | Resp 20 | Wt 267.0 lb

## 2013-12-18 DIAGNOSIS — Z923 Personal history of irradiation: Secondary | ICD-10-CM | POA: Insufficient documentation

## 2013-12-18 DIAGNOSIS — F329 Major depressive disorder, single episode, unspecified: Secondary | ICD-10-CM | POA: Insufficient documentation

## 2013-12-18 DIAGNOSIS — C50619 Malignant neoplasm of axillary tail of unspecified female breast: Secondary | ICD-10-CM

## 2013-12-18 DIAGNOSIS — Z0000 Encounter for general adult medical examination without abnormal findings: Secondary | ICD-10-CM

## 2013-12-18 DIAGNOSIS — K760 Fatty (change of) liver, not elsewhere classified: Secondary | ICD-10-CM

## 2013-12-18 DIAGNOSIS — E119 Type 2 diabetes mellitus without complications: Secondary | ICD-10-CM | POA: Insufficient documentation

## 2013-12-18 DIAGNOSIS — E669 Obesity, unspecified: Secondary | ICD-10-CM | POA: Insufficient documentation

## 2013-12-18 DIAGNOSIS — Z79811 Long term (current) use of aromatase inhibitors: Secondary | ICD-10-CM | POA: Insufficient documentation

## 2013-12-18 DIAGNOSIS — Z853 Personal history of malignant neoplasm of breast: Secondary | ICD-10-CM | POA: Insufficient documentation

## 2013-12-18 DIAGNOSIS — F419 Anxiety disorder, unspecified: Secondary | ICD-10-CM | POA: Insufficient documentation

## 2013-12-18 DIAGNOSIS — R55 Syncope and collapse: Secondary | ICD-10-CM | POA: Insufficient documentation

## 2013-12-18 DIAGNOSIS — E039 Hypothyroidism, unspecified: Secondary | ICD-10-CM

## 2013-12-18 DIAGNOSIS — Z08 Encounter for follow-up examination after completed treatment for malignant neoplasm: Secondary | ICD-10-CM | POA: Insufficient documentation

## 2013-12-18 DIAGNOSIS — Z9221 Personal history of antineoplastic chemotherapy: Secondary | ICD-10-CM | POA: Insufficient documentation

## 2013-12-18 DIAGNOSIS — C50911 Malignant neoplasm of unspecified site of right female breast: Secondary | ICD-10-CM

## 2013-12-18 DIAGNOSIS — C773 Secondary and unspecified malignant neoplasm of axilla and upper limb lymph nodes: Secondary | ICD-10-CM

## 2013-12-18 DIAGNOSIS — Z794 Long term (current) use of insulin: Secondary | ICD-10-CM | POA: Insufficient documentation

## 2013-12-18 NOTE — Patient Instructions (Addendum)
[facility] Discharge Instructions  RECOMMENDATIONS MADE BY THE CONSULTANT AND ANY TEST RESULTS WILL BE SENT TO YOUR REFERRING PHYSICIAN.  Continue Arimidex daily. You have at least 1 more year of this therapy to go. You'll come back for fasting lab work in the near future. Ultrasound of liver in 6 months.  We will test your breast cancer tissue from the past to see if we can provide you information regarding any benefit to continuing your therapy.  We will be on the look-out for your bone density test from Dr. Brigitte Pulse.  Return in 6 months for follow-up  Please call with any questions or concerns.  Thank you for choosing [facility] to provide your oncology and hematology care.  To afford each patient quality time with our providers, please arrive at least 15 minutes before your scheduled appointment time.  With your help, our goal is to use those 15 minutes to complete the necessary work-up to ensure our physicians have the information they need to help with your evaluation and healthcare recommendations.    Effective January 1st, 2014, we ask that you re-schedule your appointment with our physicians should you arrive 10 or more minutes late for your appointment.  We strive to give you quality time with our providers, and arriving late affects you and other patients whose appointments are after yours.    Again, thank you for choosing [facility].  Our hope is that these requests will decrease the amount of time that you wait before being seen by our physicians.       _____________________________________________________________  Should you have questions after your visit to [facility], please contact our office at [phone] between the hours of 8:30 a.m. and 5:00 p.m.  Voicemails left after 4:30 p.m. will not be returned until the following business day.  For prescription refill requests, have your pharmacy contact our office with  your prescription refill request.

## 2013-12-21 ENCOUNTER — Telehealth (HOSPITAL_COMMUNITY): Payer: Self-pay

## 2013-12-21 NOTE — Telephone Encounter (Signed)
-----   Message from Baird [facility], PA-C sent at 12/18/2013  4:57 PM EST ----- Please let patient know that her bone density in July was WNL.  Kelly Jacobson,Kelly Jacobson

## 2013-12-21 NOTE — Telephone Encounter (Signed)
Patient notified

## 2013-12-22 ENCOUNTER — Encounter (HOSPITAL_BASED_OUTPATIENT_CLINIC_OR_DEPARTMENT_OTHER): Payer: BC Managed Care – PPO

## 2013-12-22 ENCOUNTER — Telehealth (HOSPITAL_COMMUNITY): Payer: Self-pay | Admitting: Emergency Medicine

## 2013-12-22 DIAGNOSIS — F329 Major depressive disorder, single episode, unspecified: Secondary | ICD-10-CM | POA: Diagnosis not present

## 2013-12-22 DIAGNOSIS — R55 Syncope and collapse: Secondary | ICD-10-CM | POA: Diagnosis not present

## 2013-12-22 DIAGNOSIS — F419 Anxiety disorder, unspecified: Secondary | ICD-10-CM | POA: Diagnosis not present

## 2013-12-22 DIAGNOSIS — Z9221 Personal history of antineoplastic chemotherapy: Secondary | ICD-10-CM | POA: Diagnosis not present

## 2013-12-22 DIAGNOSIS — Z853 Personal history of malignant neoplasm of breast: Secondary | ICD-10-CM | POA: Diagnosis not present

## 2013-12-22 DIAGNOSIS — K760 Fatty (change of) liver, not elsewhere classified: Secondary | ICD-10-CM | POA: Diagnosis not present

## 2013-12-22 DIAGNOSIS — E039 Hypothyroidism, unspecified: Secondary | ICD-10-CM | POA: Diagnosis not present

## 2013-12-22 DIAGNOSIS — Z08 Encounter for follow-up examination after completed treatment for malignant neoplasm: Secondary | ICD-10-CM | POA: Diagnosis not present

## 2013-12-22 DIAGNOSIS — E669 Obesity, unspecified: Secondary | ICD-10-CM | POA: Diagnosis not present

## 2013-12-22 DIAGNOSIS — E119 Type 2 diabetes mellitus without complications: Secondary | ICD-10-CM | POA: Diagnosis not present

## 2013-12-22 DIAGNOSIS — Z923 Personal history of irradiation: Secondary | ICD-10-CM | POA: Diagnosis not present

## 2013-12-22 DIAGNOSIS — C773 Secondary and unspecified malignant neoplasm of axilla and upper limb lymph nodes: Secondary | ICD-10-CM

## 2013-12-22 DIAGNOSIS — C50619 Malignant neoplasm of axillary tail of unspecified female breast: Secondary | ICD-10-CM

## 2013-12-22 DIAGNOSIS — Z794 Long term (current) use of insulin: Secondary | ICD-10-CM | POA: Diagnosis not present

## 2013-12-22 DIAGNOSIS — C50911 Malignant neoplasm of unspecified site of right female breast: Secondary | ICD-10-CM

## 2013-12-22 DIAGNOSIS — Z79811 Long term (current) use of aromatase inhibitors: Secondary | ICD-10-CM | POA: Diagnosis not present

## 2013-12-22 DIAGNOSIS — Z0000 Encounter for general adult medical examination without abnormal findings: Secondary | ICD-10-CM

## 2013-12-22 LAB — COMPREHENSIVE METABOLIC PANEL
ALT: 32 U/L (ref 0–35)
AST: 39 U/L — ABNORMAL HIGH (ref 0–37)
Albumin: 4 g/dL (ref 3.5–5.2)
Alkaline Phosphatase: 103 U/L (ref 39–117)
Anion gap: 12 (ref 5–15)
BUN: 20 mg/dL (ref 6–23)
CALCIUM: 9.7 mg/dL (ref 8.4–10.5)
CO2: 26 meq/L (ref 19–32)
Chloride: 102 mEq/L (ref 96–112)
Creatinine, Ser: 0.65 mg/dL (ref 0.50–1.10)
GFR calc Af Amer: >90 mL/min (ref >90)
Glucose, Bld: 143 mg/dL — ABNORMAL HIGH (ref 70–99)
Potassium: 4.4 mEq/L (ref 3.7–5.3)
SODIUM: 140 meq/L (ref 137–147)
Total Bilirubin: 0.9 mg/dL (ref 0.3–1.2)
Total Protein: 7.2 g/dL (ref 6.0–8.3)

## 2013-12-22 LAB — LIPID PANEL
Cholesterol: 113 mg/dL (ref 0–200)
HDL: 45 mg/dL (ref >39)
LDL Cholesterol: 50 mg/dL (ref 0–99)
TRIGLYCERIDES: 89 mg/dL (ref <150)
Total CHOL/HDL Ratio: 2.5 RATIO
VLDL: 18 mg/dL (ref 0–40)

## 2013-12-22 LAB — CBC WITH DIFFERENTIAL/PLATELET
BASOS ABS: 0 10*3/uL (ref 0.0–0.1)
Basophils Relative: 0 % (ref 0–1)
EOS PCT: 2 % (ref 0–5)
Eosinophils Absolute: 0.2 10*3/uL (ref 0.0–0.7)
HCT: 43 % (ref 36.0–46.0)
Hemoglobin: 13.9 g/dL (ref 12.0–15.0)
LYMPHS PCT: 16 % (ref 12–46)
Lymphs Abs: 1.3 10*3/uL (ref 0.7–4.0)
MCH: 27.7 pg (ref 26.0–34.0)
MCHC: 32.3 g/dL (ref 30.0–36.0)
MCV: 85.8 fL (ref 78.0–100.0)
Monocytes Absolute: 0.4 10*3/uL (ref 0.1–1.0)
Monocytes Relative: 5 % (ref 3–12)
NEUTROS ABS: 6.6 10*3/uL (ref 1.7–7.7)
NEUTROS PCT: 77 % (ref 43–77)
PLATELETS: 253 10*3/uL (ref 150–400)
RBC: 5.01 MIL/uL (ref 3.87–5.11)
RDW: 13.5 % (ref 11.5–15.5)
WBC: 8.6 10*3/uL (ref 4.0–10.5)

## 2013-12-22 LAB — TSH: TSH: 1.91 u[IU]/mL (ref 0.350–4.500)

## 2013-12-22 NOTE — Telephone Encounter (Signed)
Notified pt that labs that we had back are WNL, will fax copy to PCP, and pt wants a copy of her lab work mailed to her when they are completed

## 2013-12-22 NOTE — Telephone Encounter (Signed)
-----   Message from Baird Cancer, PA-C sent at 12/22/2013  1:53 PM EST ----- Labs are good from our standpoint.  Fax results to PCP.  Some labs are pending.

## 2013-12-22 NOTE — Progress Notes (Signed)
Labs for cbcd,cmp,tsh,lipid,pthi,vd25

## 2013-12-23 LAB — PTH, INTACT AND CALCIUM
Calcium, Total (PTH): 9.3 mg/dL (ref 8.4–10.5)
PTH: 45 pg/mL (ref 14–64)

## 2013-12-23 LAB — VITAMIN D 25 HYDROXY (VIT D DEFICIENCY, FRACTURES): Vit D, 25-Hydroxy: 40 ng/mL (ref 30–100)

## 2013-12-31 ENCOUNTER — Encounter (HOSPITAL_COMMUNITY): Payer: Self-pay

## 2014-06-21 ENCOUNTER — Ambulatory Visit (HOSPITAL_COMMUNITY)
Admission: RE | Admit: 2014-06-21 | Discharge: 2014-06-21 | Disposition: A | Discharge status: Home / Self Care | Payer: BC Managed Care – PPO | Source: Ambulatory Visit | Attending: Oncology | Admitting: Oncology

## 2014-06-21 DIAGNOSIS — K760 Fatty (change of) liver, not elsewhere classified: Secondary | ICD-10-CM | POA: Diagnosis not present

## 2014-06-22 ENCOUNTER — Ambulatory Visit (HOSPITAL_COMMUNITY): Payer: BC Managed Care – PPO | Admitting: Hematology & Oncology

## 2014-06-22 ENCOUNTER — Encounter (HOSPITAL_COMMUNITY): Payer: BC Managed Care – PPO | Attending: Hematology & Oncology | Admitting: Hematology & Oncology

## 2014-06-22 ENCOUNTER — Encounter (HOSPITAL_COMMUNITY): Payer: Self-pay | Admitting: Hematology & Oncology

## 2014-06-22 VITALS — BP 132/72 | HR 87 | Temp 97.9°F | Resp 16 | Wt 278.0 lb

## 2014-06-22 DIAGNOSIS — C50611 Malignant neoplasm of axillary tail of right female breast: Secondary | ICD-10-CM | POA: Diagnosis not present

## 2014-06-22 DIAGNOSIS — R232 Flushing: Secondary | ICD-10-CM

## 2014-06-22 DIAGNOSIS — N951 Menopausal and female climacteric states: Secondary | ICD-10-CM | POA: Diagnosis not present

## 2014-06-22 DIAGNOSIS — C50911 Malignant neoplasm of unspecified site of right female breast: Secondary | ICD-10-CM

## 2014-06-22 DIAGNOSIS — C773 Secondary and unspecified malignant neoplasm of axilla and upper limb lymph nodes: Secondary | ICD-10-CM

## 2014-06-22 DIAGNOSIS — K760 Fatty (change of) liver, not elsewhere classified: Secondary | ICD-10-CM

## 2014-06-22 MED ORDER — CYANOCOBALAMIN 1000 MCG/ML IJ SOLN
INTRAMUSCULAR | Status: AC
  Filled 2014-06-22: qty 1

## 2014-06-22 MED ORDER — LETROZOLE 2.5 MG PO TABS: 2.5000 mg | ORAL_TABLET | Freq: Every day | ORAL | Status: DC

## 2014-06-22 NOTE — Progress Notes (Signed)
Kelly Redwood, MD [place] [id number]  Oncology History   Stage II (T1c, N1), grade 2 of right breast presenting in upper axilla with mass (actually superior portion of right arm).  1.9 cm mass with 3/26 positive nodes with extracapsular extension.  Both mets in lymph nodes were greater than 2 mm.  Negative margins.  Initial biopsy on 01/25/2009 with definitive surgery and lymph node dissection on 02/23/2009 followed by re-excision on 03/16/2009 with all margins clear.  ER 52%, PR 36%, Ki-67 33% Her2 negative.  S/P EC in dose dense fashion x 4 cycles followed by weekly Taxol.  S/P radiation Finishing in December 2011.  Now on Arimidex starting on 12/22/2009.     Infiltrating ductal carcinoma of breast   01/18/2009 Initial Diagnosis Simple excision of right axilla demonstrating high grade DCIS and invasive moderately  differentiated ductal carcinoma with positive rection margins.  ER 62%, PR 36%, Ki-67 33%, Her 2 negative   02/23/2009 Surgery Right breast lumpectomy with sentinel node and axillary dissection demonstrating a 0.3 cm DCIS involving deep margin, + LVI, 3/26 positive lymph nodes   03/16/2009 Surgery Re-excision of margins with negative disease   05/10/2009 - 06/23/2009 Chemotherapy Epirubicin, Cytoxan x 4 cycles   07/06/2009 - 09/21/2009 Chemotherapy Taxol x 12 cycles   10/17/2009 - 12/07/2009 Radiation Therapy    12/22/2009 -  Chemotherapy Anastrozole and she will take for 7-10 years (at least)   12/25/2013 Pathology Results Breast Cancer Index- 5.9% risk of distant recurrence for ER+, lymph node negative patients after 5 years giving her a low likelihood of benefit from extended endocine therapy.    CURRENT THERAPY: Anastrozole 1 mg daily beginning on 12/22/2009 // DEXA  07/29/2013, normal bone density  INTERVAL HISTORY: Kelly Jacobson 54 y.o. female returns for  regular  visit for followup of stage II right breast cancer while taking anastrozole 1 mg daily treated in  the past with lumpectomy, axillary dissection, 4 cycles of epirubicin and Cytoxan followed by Taxol x12 followed by adjuvant radiotherapy with anastrozole started on 12/22/2009.   She is here alone today.  She has significant hot flashes on Arimidex. She states she has tried several medications over the years to help but only with marginal improvement. She is ready to discontinue treatment and is anxious to be complete. Her sleeping is 'rough' due to the hot flashes.  She notes that she was in the process of going for "weight loss surgery" when she was diagnosed with breast cancer. She had already attended several classes and was going to go for a surgical referral. She states it is very difficult to get to [facility] for classes in the evenings because of her mother. She helps care for her. The last time she attended a weight loss surgery class was November 2010.    I personally reviewed and went over laboratory results with the patient.  The results are noted within this dictation.  I personally reviewed and went over radiographic studies with the patient.  The results are noted within this dictation.  Mammogram on 12/11/2013 was BIRADS 2.  She will be due for her next mammogram in Dec 2016.  Oncologically, she denies any complaints and ROS questioning is negative.   Past Medical History  Diagnosis Date  . Diabetes mellitus   . Thyroid disease   . DM (diabetes mellitus) 11/20/2010  . Hot flashes secondary to Arimidex 11/20/2010  . Breast cancer     right  . Hypothyroidism  11/20/2010    Synthroid  . Edema     hands and feet at times, takes as a direutic  . Depression     wellburtin  . Arthritis     Hands and legs  . Anxiety     Xanax  . Varicose veins 2014    has Infiltrating ductal carcinoma of breast; Hypothyroidism; DM (diabetes mellitus); and Hot flashes secondary to Arimidex on her problem list.     is allergic to darvocet and tape.  Ms. Coufal had no medications  administered during this visit.  Past Surgical History  Procedure Laterality Date  . Breast reduction surgery  1991  . Carpal tunnel release  04/06/1998  . Abdominal hysterectomy  2001    vaginal for endometriosis  . Debridement tennis elbow  2003  . Cervical fusion  01/15/2007  . Axillary cyst  01/25/2009  . Axillary node dissection  02/23/2009 and 03/16/2009  . Bilateral oophorectomy  04/19/2009  . Portacath placement  05/02/2009  . Trigger thumb surgery  02/01/2010  . Rectocele surgery  03/07/10  . Port-a-cath removal  01/16/2011    Procedure: REMOVAL PORT-A-CATH;  Surgeon: Stark Klein, MD;  Location: [facility];  Service: General;  Laterality: N/A;  Removal port-a-cath.  . Carpal tunnel release  [id number]    lt hand  . Colonoscopy N/A 05/07/2013    Procedure: COLONOSCOPY;  Surgeon: Danie Binder, MD;  [facility];  Service: Endoscopy;  Laterality: N/A;  8:30 AM    Denies any headaches, dizziness, double vision, fevers, chills, nausea, vomiting, diarrhea, constipation, chest pain, heart palpitations, shortness of breath, blood in stool, black tarry stool, urinary pain, urinary burning, urinary frequency, hematuria. Positive for night sweats.    Hot flashes due to the Anastrozole.   PHYSICAL EXAMINATION  ECOG PERFORMANCE STATUS: 0 - Asymptomatic  Filed Vitals:   06/22/14 1313  BP: 132/72  Pulse: 87  Temp: 97.9 F (36.6 C)  Resp: 16    GENERAL:alert, no distress, well nourished, well developed, comfortable, cooperative, obese and smiling SKIN: skin color, texture, turgor are normal, no rashes or significant lesions HEAD: Normocephalic, No masses, lesions, tenderness or abnormalities EYES: normal, PERRLA, EOMI, Conjunctiva are pink and non-injected EARS: External ears normal OROPHARYNX: mucous membranes are moist  NECK: supple, trachea midline LYMPH:  No palpable adenopathy in the neck, supraclavicular area, or axilla. BREAST: Bilateral reduction, lollipop procedure.  Some  dense breast tissue in the 7 o'clock position of the right breast, this is normal.  LUNGS: clear to auscultation  HEART: regular rate & rhythm ABDOMEN:abdomen soft, obese and normal bowel sounds BACK: Back symmetric, no curvature. EXTREMITIES:less then 2 second capillary refill, no joint deformities, effusion, or inflammation, no skin discoloration, no cyanosis  Left leg has varicose veins. NEURO: alert & oriented x 3 with fluent speech, no focal motor/sensory deficits, gait normal   LABORATORY DATA: CBC    Component Value Date/Time   WBC 8.6 12/22/2013 0957   RBC 5.01 12/22/2013 0957   HGB 13.9 12/22/2013 0957   HCT 43.0 12/22/2013 0957   PLT 253 12/22/2013 0957   MCV 85.8 12/22/2013 0957   MCH 27.7 12/22/2013 0957   MCHC 32.3 12/22/2013 0957   RDW 13.5 12/22/2013 0957   LYMPHSABS 1.3 12/22/2013 0957   MONOABS 0.4 12/22/2013 0957   EOSABS 0.2 12/22/2013 0957   BASOSABS 0.0 12/22/2013 0957      Chemistry      Component Value Date/Time   NA 140 12/22/2013 0957  K 4.4 12/22/2013 0957   CL 102 12/22/2013 0957   CO2 26 12/22/2013 0957   BUN 20 12/22/2013 0957   CREATININE 0.65 12/22/2013 0957      Component Value Date/Time   CALCIUM 9.7 12/22/2013 0957   CALCIUM 9.3 12/22/2013 0957   ALKPHOS 103 12/22/2013 0957   AST 39* 12/22/2013 0957   ALT 32 12/22/2013 0957   BILITOT 0.9 12/22/2013 0957       RADIOGRAPHIC STUDIES:  12/11/2013  CLINICAL DATA: History of malignant lumpectomy of the right breast and 2011. History of bilateral breast reductions. No current complaints.  EXAM: DIGITAL DIAGNOSTIC BILATERAL MAMMOGRAM WITH CAD  COMPARISON: Prior exams  ACR Breast Density Category b: There are scattered areas of fibroglandular density.  FINDINGS: There are no discrete masses. Architectural distortion reflecting postsurgical scarring is noted in the far posterior upper outer right breast, stable. No other architectural distortion. No suspicious  calcifications.  Mammographic images were processed with CAD.  IMPRESSION: No evidence of recurrent or new malignancy. Benign postsurgical scarring on the right, stable.  RECOMMENDATION: Diagnostic mammography in 1 year per standard post lumpectomy protocol.  I have discussed the findings and recommendations with the patient. Results were also provided in writing at the conclusion of the visit. If applicable, a reminder letter will be sent to the patient regarding the next appointment.  BI-RADS CATEGORY 2: Benign Finding(s)   Electronically Signed  By: Lajean Manes M.D.  On: 12/11/2013 09:14  CLINICAL DATA: Hepatic steatosis  EXAM: US ABDOMEN LIMITED - RIGHT UPPER QUADRANT  COMPARISON: MR abdomen September 25, 2011  FINDINGS: Gallbladder:  No gallstones or wall thickening visualized. There is no pericholecystic fluid. No sonographic Murphy sign noted.  Common bile duct:  Diameter: 2 mm. There is no intrahepatic or extrahepatic biliary duct dilatation  Liver:  No focal lesion identified. Liver echogenicity is diffusely increased. Liver is prominent measuring approximately 21 cm in length.  IMPRESSION: Prominent liver with increased liver echogenicity, most likely due to hepatic steatosis. While no focal liver lesions are identified, it must be cautioned that the sensitivity of ultrasound for focal liver lesions is diminished in this circumstance. Study otherwise unremarkable.   Electronically Signed  By: Lowella Grip III M.D.  On: 06/21/2014 09:45   ASSESSMENT:  1. Stage II right breast cancer while taking anastrozole 1 mg daily treated in the past with lumpectomy, axillary dissection, 4 cycles of epirubicin and Cytoxan followed by Taxol x12 followed by adjuvant radiotherapy with anastrozole started on 12/22/2009. 2. 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 3. Obesity 4. Normal bone density on  07/29/2013 by Dr. Brigitte Pulse. 5. Fatty infiltration of liver   Patient Active Problem List   Diagnosis Date Noted  . Hypothyroidism 11/20/2010  . DM (diabetes mellitus) 11/20/2010  . Hot flashes secondary to Arimidex 11/20/2010  . Infiltrating ductal carcinoma of breast 09/06/2010    PLAN:    I encouraged her to complete a full 5 years of aromatase inhibitor therapy. I offered to try her on Femara instead of Arimidex to see if there is any improvement in her hot flashes. I do not want to switch her to tamoxifen or Aromasin for such a limited time. She is not interested in taking endocrine therapy longer than 5 years as she states her hot flashes are quite unbearable.  She is not on calcium because she states she has had high calcium in the past and it has been discontinued by her primary physician. PTH has been checked  and is WNL. DEXA was reviewed with the patient and I advised her she had normal bone density. I have encouraged physical activity and dietary intake of calcium and vitamin D containing foods.  Referral for genetics counseling and testing. She does not recall ever meeting with genetics. I will review her medical records further to see if this is the case. She has not met with genetics we will make the appropriate referral.  Referral to [facility] to discuss weight loss surgery.  We discussed weight loss and exercise as ways to improve her fatty liver. We may also need to consider referral to GI.  We will see her back in 6 weeks to see if she has any improvement in her hot flashes. In addition we will continue with ongoing encouragement to complete her therapy at years end.  THERAPY PLAN:  NCCN guidelines recommends the following surveillance for invasive breast cancer:  A. History and Physical exam every 4-6 months for 5 years and then every 12 months.  B. Mammography every 12 months  C. Women on Tamoxifen: annual gynecologic assessment every 12 months if uterus is  present.  D. Women on aromatase inhibitor or who experience ovarian failure secondary to treatment should have monitoring of bone health with a bone mineral density determination at baseline and periodically thereafter.  E. Assess and encourage adherence to adjuvant endocrine therapy.  F. Evidence suggests that active lifestyle and achieving and maintaining an ideal body weight (20-25 BMI) may lead to optimal breast cancer outcomes.    All questions were answered. The patient knows to call the clinic with any problems, questions or concerns. We can certainly see the patient much sooner if necessary.  This document serves as a record of services personally performed by Ancil Linsey, MD. It was created on her behalf by Arlyce Harman, a trained medical scribe. The creation of this record is based on the scribe's personal observations and the provider's statements to them. This document has been checked and approved by the attending provider.  I have reviewed the above documentation for accuracy and completeness, and I agree with the above. This note was electronically signed. Molli Hazard MD 06/22/2014

## 2014-06-22 NOTE — Patient Instructions (Signed)
[place] at [facility] Discharge Instructions  RECOMMENDATIONS MADE BY THE CONSULTANT AND ANY TEST RESULTS WILL BE SENT TO YOUR REFERRING PHYSICIAN.  Exam and discussion by Dr. Whitney Muse. Will stop the Arimidex (anastrazole) and start you on Femara (lotrazole) for your breast cancer to see if your hot flashes will improve. Will refer you to [facility] for weight loss surgery. Report any new lumps, bone pain, shortness of breath or other symptoms.  Follow-up in 6 weeks.    Thank you for choosing [place] at [facility] to provide your oncology and hematology care.  To afford each patient quality time with our provider, please arrive at least 15 minutes before your scheduled appointment time.    You need to re-schedule your appointment should you arrive 10 or more minutes late.  We strive to give you quality time with our providers, and arriving late affects you and other patients whose appointments are after yours.  Also, if you no show three or more times for appointments you may be dismissed from the clinic at the providers discretion.     Again, thank you for choosing [facility].  Our hope is that these requests will decrease the amount of time that you wait before being seen by our physicians.       _____________________________________________________________  Should you have questions after your visit to [facility], please contact our office at [phone] between the hours of 8:30 a.m. and 4:30 p.m.  Voicemails left after 4:30 p.m. will not be returned until the following business day.  For prescription refill requests, have your pharmacy contact our office.

## 2014-06-23 ENCOUNTER — Ambulatory Visit (HOSPITAL_COMMUNITY): Payer: BC Managed Care – PPO | Admitting: Oncology

## 2014-08-03 ENCOUNTER — Ambulatory Visit (HOSPITAL_COMMUNITY): Payer: BC Managed Care – PPO | Admitting: Hematology & Oncology

## 2014-08-04 ENCOUNTER — Encounter (HOSPITAL_COMMUNITY): Payer: BC Managed Care – PPO | Attending: Oncology | Admitting: Oncology

## 2014-08-04 ENCOUNTER — Encounter (HOSPITAL_BASED_OUTPATIENT_CLINIC_OR_DEPARTMENT_OTHER): Payer: BC Managed Care – PPO

## 2014-08-04 VITALS — BP 147/92 | HR 91 | Temp 97.8°F | Resp 20 | Wt 271.3 lb

## 2014-08-04 DIAGNOSIS — C50611 Malignant neoplasm of axillary tail of right female breast: Secondary | ICD-10-CM

## 2014-08-04 DIAGNOSIS — C773 Secondary and unspecified malignant neoplasm of axilla and upper limb lymph nodes: Secondary | ICD-10-CM

## 2014-08-04 DIAGNOSIS — C50911 Malignant neoplasm of unspecified site of right female breast: Secondary | ICD-10-CM

## 2014-08-04 DIAGNOSIS — Z170 Estrogen receptor positive status [ER+]: Secondary | ICD-10-CM

## 2014-08-04 LAB — COMPREHENSIVE METABOLIC PANEL
ALBUMIN: 4.4 g/dL (ref 3.5–5.0)
ALT: 39 U/L (ref 14–54)
AST: 53 U/L — AB (ref 15–41)
Alkaline Phosphatase: 100 U/L (ref 38–126)
Anion gap: 10 (ref 5–15)
BUN: 15 mg/dL (ref 6–20)
CO2: 29 mmol/L (ref 22–32)
Calcium: 9.8 mg/dL (ref 8.9–10.3)
Chloride: 98 mmol/L — ABNORMAL LOW (ref 101–111)
Creatinine, Ser: 0.82 mg/dL (ref 0.44–1.00)
GLUCOSE: 296 mg/dL — AB (ref 65–99)
POTASSIUM: 3.7 mmol/L (ref 3.5–5.1)
Sodium: 137 mmol/L (ref 135–145)
TOTAL PROTEIN: 7.3 g/dL (ref 6.5–8.1)
Total Bilirubin: 0.9 mg/dL (ref 0.3–1.2)

## 2014-08-04 LAB — CBC WITH DIFFERENTIAL/PLATELET
BASOS PCT: 0 % (ref 0–1)
Basophils Absolute: 0 10*3/uL (ref 0.0–0.1)
EOS ABS: 0.3 10*3/uL (ref 0.0–0.7)
EOS PCT: 3 % (ref 0–5)
HCT: 44.8 % (ref 36.0–46.0)
HEMOGLOBIN: 14.7 g/dL (ref 12.0–15.0)
LYMPHS ABS: 2.1 10*3/uL (ref 0.7–4.0)
Lymphocytes Relative: 23 % (ref 12–46)
MCH: 27.6 pg (ref 26.0–34.0)
MCHC: 32.8 g/dL (ref 30.0–36.0)
MCV: 84.1 fL (ref 78.0–100.0)
MONOS PCT: 6 % (ref 3–12)
Monocytes Absolute: 0.6 10*3/uL (ref 0.1–1.0)
NEUTROS ABS: 6.4 10*3/uL (ref 1.7–7.7)
Neutrophils Relative %: 68 % (ref 43–77)
PLATELETS: 302 10*3/uL (ref 150–400)
RBC: 5.33 MIL/uL — AB (ref 3.87–5.11)
RDW: 14 % (ref 11.5–15.5)
WBC: 9.4 10*3/uL (ref 4.0–10.5)

## 2014-08-04 NOTE — Assessment & Plan Note (Addendum)
Stage II right breast cancer while taking anastrozole 1 mg daily treated in the past with lumpectomy, axillary dissection, 4 cycles of epirubicin and Cytoxan followed by Taxol x12 followed by adjuvant radiotherapy with AI therapy beginning on 12/22/2009.  Labs in 4 months: CBC diff, CMET  Referral to [facility] for the future.  Continue AI through 2016 and finish in Jan 2017.  Return in 4-5 months for follow-up.

## 2014-08-04 NOTE — Patient Instructions (Signed)
..  [place] at [facility] Discharge Instructions  RECOMMENDATIONS MADE BY THE CONSULTANT AND ANY TEST RESULTS WILL BE SENT TO YOUR REFERRING PHYSICIAN.  Referral to genetics counselor Continue letrozole Labs in 4 months Return to see Dr. Whitney Muse in 4 months  Thank you for choosing [place] at [facility] to provide your oncology and hematology care.  To afford each patient quality time with our provider, please arrive at least 15 minutes before your scheduled appointment time.    You need to re-schedule your appointment should you arrive 10 or more minutes late.  We strive to give you quality time with our providers, and arriving late affects you and other patients whose appointments are after yours.  Also, if you no show three or more times for appointments you may be dismissed from the clinic at the providers discretion.     Again, thank you for choosing [facility].  Our hope is that these requests will decrease the amount of time that you wait before being seen by our physicians.       _____________________________________________________________  Should you have questions after your visit to [facility], please contact our office at [phone] between the hours of 8:30 a.m. and 4:30 p.m.  Voicemails left after 4:30 p.m. will not be returned until the following business day.  For prescription refill requests, have your pharmacy contact our office.

## 2014-08-04 NOTE — Progress Notes (Signed)
[name], MD [address] [place] [address]  No diagnosis found.  CURRENT THERAPY: Letrozole 2.5 mg daily beginning on 06/22/2014 due to severe hot flashes after being on Anastrozole since 12/22/2009  INTERVAL HISTORY: Kelly Jacobson 54 y.o. female returns for  regular  visit for followup of stage II right breast cancer while taking anastrozole 1 mg daily treated in the past with lumpectomy, axillary dissection, 4 cycles of epirubicin and Cytoxan followed by Taxol x12 followed by adjuvant radiotherapy with AI therapy beginning on 12/22/2009.  Oncology History   Stage II (T1c, N1), grade 2 of right breast presenting in upper axilla with mass (actually superior portion of right arm).  1.9 cm mass with 3/26 positive nodes with extracapsular extension.  Both mets in lymph nodes were greater than 2 mm.  Negative margins.  Initial biopsy on 01/25/2009 with definitive surgery and lymph node dissection on 02/23/2009 followed by re-excision on 03/16/2009 with all margins clear.  ER 52%, PR 36%, Ki-67 33% Her2 negative.  S/P EC in dose dense fashion x 4 cycles followed by weekly Taxol.  S/P radiation Finishing in December 2011.  Now on Arimidex starting on 12/22/2009.     Infiltrating ductal carcinoma of breast   01/18/2009 Initial Diagnosis Simple excision of right axilla demonstrating high grade DCIS and invasive moderately  differentiated ductal carcinoma with positive rection margins.  ER 62%, PR 36%, Ki-67 33%, Her 2 negative   02/23/2009 Surgery Right breast lumpectomy with sentinel node and axillary dissection demonstrating a 0.3 cm DCIS involving deep margin, + LVI, 3/26 positive lymph nodes   03/16/2009 Surgery Re-excision of margins with negative disease   05/10/2009 - 06/23/2009 Chemotherapy Epirubicin, Cytoxan x 4 cycles   07/06/2009 - 09/21/2009 Chemotherapy Taxol x 12 cycles   10/17/2009 - 12/07/2009 Radiation Therapy    12/22/2009 - 06/22/2014 Chemotherapy Anastrozole   12/25/2013  Pathology Results Breast Cancer Index- 5.9% risk of distant recurrence for ER+, lymph node negative patients after 5 years giving her a low likelihood of benefit from extended endocine therapy.   06/23/2014 -  Anti-estrogen oral therapy Letrozole 2.5 mg daily    I personally reviewed and went over laboratory results with the patient.  The results are noted within this dictation.  She will be due for her next mammogram in Dec 2016.  She denies any improvement in her hot flashes with Letrozole. Unfortunately, she will have to deal with these for the next few months in order to complete 5 years worth of treatment.  I do not see Genetics Counseling appointment in the past and therefore, I will refer her accordingly.   Past Medical History  Diagnosis Date  . Diabetes mellitus   . Thyroid disease   . DM (diabetes mellitus) 11/20/2010  . Hot flashes secondary to Arimidex 11/20/2010  . Breast cancer     right  . Hypothyroidism 11/20/2010    Synthroid  . Edema     hands and feet at times, takes as a direutic  . Depression     wellburtin  . Arthritis     Hands and legs  . Anxiety     Xanax  . Varicose veins 2014    has Infiltrating ductal carcinoma of breast; Hypothyroidism; DM (diabetes mellitus); and Hot flashes secondary to Arimidex on her problem list.     is allergic to darvocet and tape.  Ms. Cirigliano does not currently have medications on file.  Past Surgical History  Procedure Laterality Date  .  Breast reduction surgery  1991  . Carpal tunnel release  04/06/1998  . Abdominal hysterectomy  2001    vaginal for endometriosis  . Debridement tennis elbow  2003  . Cervical fusion  01/15/2007  . Axillary cyst  01/25/2009  . Axillary node dissection  02/23/2009 and 03/16/2009  . Bilateral oophorectomy  04/19/2009  . Portacath placement  05/02/2009  . Trigger thumb surgery  02/01/2010  . Rectocele surgery  03/07/10  . Port-a-cath removal  01/16/2011    Procedure: REMOVAL PORT-A-CATH;   Surgeon: Stark Klein, MD;  Location: [facility];  Service: General;  Laterality: N/A;  Removal port-a-cath.  . Carpal tunnel release  [id number]    lt hand  . Colonoscopy N/A 05/07/2013    Procedure: COLONOSCOPY;  Surgeon: Danie Binder, MD;  [facility];  Service: Endoscopy;  Laterality: N/A;  8:30 AM    Denies any headaches, dizziness, double vision, fevers, chills, night sweats, nausea, vomiting, diarrhea, constipation, chest pain, heart palpitations, shortness of breath, blood in stool, black tarry stool, urinary pain, urinary burning, urinary frequency, hematuria.   PHYSICAL EXAMINATION  ECOG PERFORMANCE STATUS: 0 - Asymptomatic  Filed Vitals:   08/04/14 1300  BP: 147/92  Pulse: 91  Temp: 97.8 F (36.6 C)  Resp: 20    GENERAL:alert, no distress, well nourished, well developed, comfortable, cooperative, obese and smiling SKIN: skin color, texture, turgor are normal, no rashes or significant lesions HEAD: Normocephalic, No masses, lesions, tenderness or abnormalities EYES: normal, PERRLA, EOMI, Conjunctiva are pink and non-injected EARS: External ears normal OROPHARYNX:mucous membranes are moist  NECK: supple, trachea midline LYMPH:  not examined BREAST:not examined LUNGS: clear to auscultation  HEART: regular rate & rhythm ABDOMEN:abdomen soft, obese and normal bowel sounds BACK: Back symmetric, no curvature. EXTREMITIES:less then 2 second capillary refill, no joint deformities, effusion, or inflammation, no skin discoloration, no cyanosis  NEURO: alert & oriented x 3 with fluent speech, no focal motor/sensory deficits, gait normal   LABORATORY DATA: CBC    Component Value Date/Time   WBC 9.4 08/04/2014 1327   RBC 5.33* 08/04/2014 1327   HGB 14.7 08/04/2014 1327   HCT 44.8 08/04/2014 1327   PLT 302 08/04/2014 1327   MCV 84.1 08/04/2014 1327   MCH 27.6 08/04/2014 1327   MCHC 32.8 08/04/2014 1327   RDW 14.0 08/04/2014 1327   LYMPHSABS 2.1 08/04/2014 1327    MONOABS 0.6 08/04/2014 1327   EOSABS 0.3 08/04/2014 1327   BASOSABS 0.0 08/04/2014 1327      Chemistry      Component Value Date/Time   NA 140 12/22/2013 0957   K 4.4 12/22/2013 0957   CL 102 12/22/2013 0957   CO2 26 12/22/2013 0957   BUN 20 12/22/2013 0957   CREATININE 0.65 12/22/2013 0957      Component Value Date/Time   CALCIUM 9.7 12/22/2013 0957   CALCIUM 9.3 12/22/2013 0957   ALKPHOS 103 12/22/2013 0957   AST 39* 12/22/2013 0957   ALT 32 12/22/2013 0957   BILITOT 0.9 12/22/2013 0957       RADIOGRAPHIC STUDIES:  12/11/2013  CLINICAL DATA: History of malignant lumpectomy of the right breast and 2011. History of bilateral breast reductions. No current complaints.  EXAM: DIGITAL DIAGNOSTIC BILATERAL MAMMOGRAM WITH CAD  COMPARISON: Prior exams  ACR Breast Density Category b: There are scattered areas of fibroglandular density.  FINDINGS: There are no discrete masses. Architectural distortion reflecting postsurgical scarring is noted in the far posterior upper outer right breast,  stable. No other architectural distortion. No suspicious calcifications.  Mammographic images were processed with CAD.  IMPRESSION: No evidence of recurrent or new malignancy. Benign postsurgical scarring on the right, stable.  RECOMMENDATION: Diagnostic mammography in 1 year per standard post lumpectomy protocol.  I have discussed the findings and recommendations with the patient. Results were also provided in writing at the conclusion of the visit. If applicable, a reminder letter will be sent to the patient regarding the next appointment.  BI-RADS CATEGORY 2: Benign Finding(s)   Electronically Signed  By: Lajean Manes M.D.  On: 12/11/2013 09:14     ASSESSMENT/PLAN:   Infiltrating ductal carcinoma of breast Stage II right breast cancer while taking anastrozole 1 mg daily treated in the past with lumpectomy, axillary dissection, 4 cycles of  epirubicin and Cytoxan followed by Taxol x12 followed by adjuvant radiotherapy with AI therapy beginning on 12/22/2009.  Labs in 4 months: CBC diff, CMET  Referral to [employer]  Return in 4-5 months for follow-up.   THERAPY PLAN:  NCCN guidelines recommends the following surveillance for invasive breast cancer:  A. History and Physical exam every 4-6 months for 5 years and then every 12 months.  B. Mammography every 12 months  C. Women on Tamoxifen: annual gynecologic assessment every 12 months if uterus is present.  D. Women on aromatase inhibitor or who experience ovarian failure secondary to treatment should have monitoring of bone health with a bone mineral density determination at baseline and periodically thereafter.  E. Assess and encourage adherence to adjuvant endocrine therapy.  F. Evidence suggests that active lifestyle and achieving and maintaining an ideal body weight (20-25 BMI) may lead to optimal breast cancer outcomes.   All questions were answered. The patient knows to call the clinic with any problems, questions or concerns. We can certainly see the patient much sooner if necessary.  Patient and plan discussed with Dr. Ancil Linsey and she is in agreement with the aforementioned.    KEFALAS,THOMAS 08/04/2014

## 2014-08-05 NOTE — Progress Notes (Signed)
Labs drawn

## 2014-08-09 ENCOUNTER — Encounter (HOSPITAL_COMMUNITY): Payer: Self-pay | Admitting: Genetic Counselor

## 2014-08-09 ENCOUNTER — Encounter (HOSPITAL_COMMUNITY): Payer: BC Managed Care – PPO | Attending: Hematology & Oncology | Admitting: Genetic Counselor

## 2014-08-09 DIAGNOSIS — Z800 Family history of malignant neoplasm of digestive organs: Secondary | ICD-10-CM

## 2014-08-09 DIAGNOSIS — C50611 Malignant neoplasm of axillary tail of right female breast: Secondary | ICD-10-CM

## 2014-08-09 DIAGNOSIS — Z801 Family history of malignant neoplasm of trachea, bronchus and lung: Secondary | ICD-10-CM

## 2014-08-09 DIAGNOSIS — Z315 Encounter for genetic counseling: Secondary | ICD-10-CM | POA: Diagnosis not present

## 2014-08-09 DIAGNOSIS — Z808 Family history of malignant neoplasm of other organs or systems: Secondary | ICD-10-CM

## 2014-08-09 DIAGNOSIS — Z8051 Family history of malignant neoplasm of kidney: Secondary | ICD-10-CM

## 2014-08-09 DIAGNOSIS — Z8042 Family history of malignant neoplasm of prostate: Secondary | ICD-10-CM

## 2014-08-09 DIAGNOSIS — C50911 Malignant neoplasm of unspecified site of right female breast: Secondary | ICD-10-CM

## 2014-08-09 DIAGNOSIS — C773 Secondary and unspecified malignant neoplasm of axilla and upper limb lymph nodes: Secondary | ICD-10-CM

## 2014-08-09 DIAGNOSIS — Z809 Family history of malignant neoplasm, unspecified: Secondary | ICD-10-CM

## 2014-08-09 DIAGNOSIS — Z8489 Family history of other specified conditions: Secondary | ICD-10-CM

## 2014-08-09 NOTE — Progress Notes (Signed)
REFERRING PROVIDER: Ancil Linsey, MD  PRIMARY PROVIDER:  [name], MD  PRIMARY REASON FOR VISIT:  1. Infiltrating ductal carcinoma of breast, right   2. Family history of pancreatic cancer   3. Family history of melanoma   4. Family history of colon cancer   5. Family history of kidney cancer   6. Family history of brain tumor   7. Family history of prostate cancer   8. Family history of throat cancer   9. Family history of cancer      HISTORY OF PRESENT ILLNESS:   Ms. Corrigan, a 54 y.o. female, was seen for a [facility] cancer genetics consultation at the request of Dr. Brigitte Pulse due to a personal and family history of cancer.  Ms. Eddleman presents to clinic today to discuss the possibility of a hereditary predisposition to cancer, genetic testing, and to further clarify her future cancer risks, as well as potential cancer risks for family members.   In 2011, at the age of 64, Ms. Chaudhari was diagnosed with invasive ductal carcinoma and DCIS of the right breast. Hormone receptor status was ER/PR+, Her2-; Ki67 was 33%.  This was treated with right lumpectomy, chemotherapy, and radiation.    CANCER HISTORY:  Oncology History   Stage II (T1c, N1), grade 2 of right breast presenting in upper axilla with mass (actually superior portion of right arm).  1.9 cm mass with 3/26 positive nodes with extracapsular extension.  Both mets in lymph nodes were greater than 2 mm.  Negative margins.  Initial biopsy on 01/25/2009 with definitive surgery and lymph node dissection on 02/23/2009 followed by re-excision on 03/16/2009 with all margins clear.  ER 52%, PR 36%, Ki-67 33% Her2 negative.  S/P EC in dose dense fashion x 4 cycles followed by weekly Taxol.  S/P radiation Finishing in December 2011.  Now on Arimidex starting on 12/22/2009.     Infiltrating ductal carcinoma of breast   01/18/2009 Initial Diagnosis Simple excision of right axilla demonstrating high grade DCIS and invasive moderately   differentiated ductal carcinoma with positive rection margins.  ER 62%, PR 36%, Ki-67 33%, Her 2 negative   02/23/2009 Surgery Right breast lumpectomy with sentinel node and axillary dissection demonstrating a 0.3 cm DCIS involving deep margin, + LVI, 3/26 positive lymph nodes   03/16/2009 Surgery Re-excision of margins with negative disease   05/10/2009 - 06/23/2009 Chemotherapy Epirubicin, Cytoxan x 4 cycles   07/06/2009 - 09/21/2009 Chemotherapy Taxol x 12 cycles   10/17/2009 - 12/07/2009 Radiation Therapy    12/22/2009 - 06/22/2014 Chemotherapy Anastrozole   12/25/2013 Pathology Results Breast Cancer Index- 5.9% risk of distant recurrence for ER+, lymph node negative patients after 5 years giving her a low likelihood of benefit from extended endocine therapy.   06/23/2014 -  Anti-estrogen oral therapy Letrozole 2.5 mg daily     HORMONAL RISK FACTORS:  Menarche was at age 76.  First live birth at age 15.  OCP use for approximately 6-7 years.  Ovaries intact: no.  Hysterectomy: yes.  Menopausal status: post-menopausal.  HRT use: 0 years. Colonoscopy: yes}; normal. Mammogram within the last year: yes. Number of breast biopsies: 1. Up to date with pelvic exams:  n/a. Any excessive radiation exposure in the past:  no  Past Medical History  Diagnosis Date  . Diabetes mellitus   . Thyroid disease   . DM (diabetes mellitus) 11/20/2010  . Hot flashes secondary to Arimidex 11/20/2010  . Hypothyroidism 11/20/2010    Synthroid  .  Edema     hands and feet at times, takes as a direutic  . Depression     wellburtin  . Arthritis     Hands and legs  . Anxiety     Xanax  . Varicose veins 2014  . Breast cancer 2011    right IDC+DCIS; ER/PR+, Her2-, ki67=33%; right lumpectomy    Past Surgical History  Procedure Laterality Date  . Breast reduction surgery  1991  . Carpal tunnel release  04/06/1998  . Abdominal hysterectomy  2001    vaginal for endometriosis  . Debridement tennis elbow  2003   . Cervical fusion  01/15/2007  . Axillary cyst  01/25/2009  . Axillary node dissection  02/23/2009 and 03/16/2009  . Bilateral oophorectomy  04/19/2009  . Portacath placement  05/02/2009  . Trigger thumb surgery  02/01/2010  . Rectocele surgery  03/07/10  . Port-a-cath removal  01/16/2011    Procedure: REMOVAL PORT-A-CATH;  Surgeon: Stark Klein, MD;  Location: [facility];  Service: General;  Laterality: N/A;  Removal port-a-cath.  . Carpal tunnel release  [id number]    lt hand  . Colonoscopy N/A 05/07/2013    Procedure: COLONOSCOPY;  Surgeon: Danie Binder, MD;  [facility];  Service: Endoscopy;  Laterality: N/A;  8:30 AM    History   Social History  . Marital Status: Single    Spouse Name: N/A  . Number of Children: N/A  . Years of Education: N/A   Social History Main Topics  . Smoking status: Never Smoker   . Smokeless tobacco: Never Used  . Alcohol Use: Yes     Comment: Occasional - maybe 4 drinks per year  . Drug Use: No  . Sexual Activity: Not on file   Other Topics Concern  . None   Social History Narrative     FAMILY HISTORY:  We obtained a detailed, 4-generation family history.  Significant diagnoses are listed below: Family History  Problem Relation Age of Onset  . Anesthesia problems Neg Hx   . Hypotension Neg Hx   . Malignant hyperthermia Neg Hx   . Pseudochol deficiency Neg Hx   . Melanoma Father     dx. 61s  . Heart attack Maternal Uncle   . Other Paternal Aunt     hysterectomy  . Diabetes Paternal Aunt   . Cancer Paternal Aunt     unknown type  . Colon cancer Paternal Grandmother     dx. 50s-60s  . Heart attack Paternal Grandfather   . Throat cancer Cousin 63  . Prostate cancer Cousin 70  . Kidney cancer Paternal Aunt     dx. late 50s-early 60s  . Pancreatic cancer Other     (x2 paternal great aunts)  . Brain cancer Other     unknown tumor type     Ms. Lucking has one son, age 77, and one daughter, age 103.  Both are cancer-free.  Ms. Bauch  has two full brothers, ages 12 and 27, who are also cancer-free.  Ms. Ulibarri's mother is alive and cancer-free at 13.  Ms. Sussman's father died at 68; he had a history of melanoma diagnosed in his 47s.    There is a paternal family history of cancer.  Ms. Klawitter's father had two full sisters.  One sister was diagnosed with an unknown type of cancer and died in her 30s; of note, this sister did have a hysterectomy.  The other sister was diagnosed with kidney cancer in her  late 50s-early 62s. Ms. Brodhead has two paternal first cousins, but is unaware of any cancer diagnosed for them.  Her paternal grandmother was diagnosed with colon cancer and passed away in her 73s-60s.  Ms. Kimmey paternal grandfather died in his 26s of a heart attack.  He had at least three full sisters who had a history of cancer.  Two sisters were diagnosed with pancreatic cancer at unknown ages.  Another sister was diagnosed with a brain tumor at an unknown age.  Ms. Preslar had no further information regarding how many total siblings her grandfather had, whether there were additional siblings with cancer, or whether his parents had cancer.    There is a maternal family history of throat cancer and prostate cancer in two maternal first cousins, diagnosed at 55 and 79, respectively.  Ms. Fiallos mother had two full brothers, both of whom died in their 73s, but were cancer-free.  Ms. Kocurek's maternal grandparents both died in their late 67s, but did not have cancer to her knowledge.     Patient's maternal and paternal ancestors are of Caucasian descent. There is no reported Ashkenazi Jewish ancestry. There is no known consanguinity.  GENETIC COUNSELING ASSESSMENT: ARIYONA EID is a 54 y.o. female with a personal and family history of cancer which is somewhat suggestive of a hereditary cancer syndrome and predisposition to cancer. We, therefore, discussed and recommended the following at today's visit.    DISCUSSION: We reviewed the characteristics, features and inheritance patterns of hereditary cancer syndromes, particularly those caused by changes within the BRCA1/2 and Lynch Syndrome genes. We also discussed genetic testing, including the appropriate family members to test, the process of testing, insurance coverage and turn-around-time for results. We discussed the 32-gene CancerNext Panel through [employer] [facility] [employer] [facility] [place]   Based on Ms. Neff's personal and family history of cancer, she meets medical criteria for genetic testing. Despite that she meets criteria, she may still have an out of pocket cost. We discussed that if her out of pocket cost for testing is over $100, the laboratory will call and confirm whether she wants to proceed with testing.  If the out of pocket cost of testing is less than $100 she will be billed by the genetic testing laboratory.   PLAN: After considering the risks, benefits, and limitations, Ms. Vanderpool  provided informed consent to pursue genetic testing and the blood sample was sent to [employer] [facility] for analysis of the 32-gene CancerNext Panel test.  The CancerNext Panel includes sequencing and deletion/duplication analysis for the following 30 genes: APC, ATM, BARD1, BRCA1, BRCA2, BRIP1, BMPR1A, CDH1, CDK4, CDKN2A, CHEK2, MLH1, MRE11A, MSH2, MSH6, MUTYH, NBN, NF1, PALB2, PMS2, POLD1, POLE, PTEN, RAD50, RAD51C, RAD51D, SMAD4, SMARCA4, STK11, and TP53.  This panel also includes deletion/duplication analysis (without sequencing) for two genes, EPCAM and GREM1/SCG5.  Results should be available within approximately 3-4 weeks' time, at which point they will be disclosed by telephone to Ms. Barsanti, as will any additional recommendations warranted by these results. Ms. Laningham will receive a summary of her genetic counseling visit and a copy of her results once available. This information will also be available in Epic. We encouraged  Ms. Klauer to remain in contact with cancer genetics annually so that we can continuously update the family history and inform her of any changes in cancer genetics and testing that may be of benefit for her family. Ms. Bosso questions were answered to her satisfaction today. Our contact information was provided  should additional questions or concerns arise.  Thank you for the referral and allowing [place] to share in the care of your patient.   Jeanine Luz, MS Genetic Counselor [email or web address] Phone: [phone]  The patient was seen for a total of 60 minutes in face-to-face genetic counseling.  This patient was discussed with Drs. Magrinat, Lindi Adie and/or Burr Medico who agrees with the above.    _______________________________________________________________________ For Office Staff:  Number of people involved in session: 1 Was an Intern/ student involved with case: no

## 2014-08-17 ENCOUNTER — Encounter (HOSPITAL_COMMUNITY): Payer: Self-pay

## 2014-08-27 ENCOUNTER — Telehealth: Payer: Self-pay | Admitting: Genetic Counselor

## 2014-08-27 DIAGNOSIS — Z1379 Encounter for other screening for genetic and chromosomal anomalies: Secondary | ICD-10-CM

## 2014-08-30 DIAGNOSIS — Z1379 Encounter for other screening for genetic and chromosomal anomalies: Secondary | ICD-10-CM | POA: Insufficient documentation

## 2014-08-30 NOTE — Telephone Encounter (Signed)
Discussed with Ms. 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  This is likely the explanation for why Ms. Mikkelsen was diagnosed with her breast cancer at a young age.  This mutation means that Ms. Frame is at an increased risk for breast and colon cancer, as well as potentially some other cancers in the future as we learn more about this gene.  Both her son and her daughter would have a 50% chance of having inherited this mutation as well and they should have genetic testing when they are ready to evaluate their own cancer risks.  We also discussed that we will now be able to follow Ms. Diviney more appropriately; she should have annual breast MRIs in addition to annual mammograms and colonoscopies every 5 years.  Ms. Wolfson would like to schedule a follow-up appt to discuss this result and these recommendations in more detail.  She will see [place] here at the [facility] on Thursday, August, 25th.

## 2014-09-02 ENCOUNTER — Ambulatory Visit (HOSPITAL_BASED_OUTPATIENT_CLINIC_OR_DEPARTMENT_OTHER): Payer: BC Managed Care – PPO | Admitting: Genetic Counselor

## 2014-09-02 DIAGNOSIS — Z853 Personal history of malignant neoplasm of breast: Secondary | ICD-10-CM | POA: Diagnosis not present

## 2014-09-02 DIAGNOSIS — Z315 Encounter for genetic counseling: Secondary | ICD-10-CM | POA: Diagnosis not present

## 2014-09-02 DIAGNOSIS — Z800 Family history of malignant neoplasm of digestive organs: Secondary | ICD-10-CM | POA: Insufficient documentation

## 2014-09-02 DIAGNOSIS — IMO0002 Reserved for concepts with insufficient information to code with codable children: Secondary | ICD-10-CM | POA: Insufficient documentation

## 2014-09-02 DIAGNOSIS — Z809 Family history of malignant neoplasm, unspecified: Secondary | ICD-10-CM

## 2014-09-02 DIAGNOSIS — Z1509 Genetic susceptibility to other malignant neoplasm: Secondary | ICD-10-CM | POA: Insufficient documentation

## 2014-09-02 DIAGNOSIS — Z1379 Encounter for other screening for genetic and chromosomal anomalies: Secondary | ICD-10-CM

## 2014-09-02 NOTE — Progress Notes (Signed)
GENETIC TEST RESULTS    HPI: Ms. Verno was seen in the [facility] at [facility] on August 09, 2014 due to a personal history of breast cancer 24 and family history of colon, kidney, and other cancers and concern regarding a hereditary predisposition to cancer in the family. Please refer to the prior Genetics clinic note from August 09, 2014 for more information regarding Ms. Levandowski's medical and family histories and our assessment at the time. Ms. Keetch returns today with a friend to discuss her positive genetic test result.  GENETIC TESTING: At the time of Ms. Sautter's visit, we recommended she pursue genetic testing of the 32-gene CancerNext panel offered by [employer] ([place], [place]). This test included sequencing and deletion/duplication analysis of the following genes: APC, ATM, BARD1, BRCA1, BRCA2, BRIP1, BMPR1A, CDH1, CDK4, CDKN2A, CHEK2, MLH1, MRE11A, MSH2, MSH6, MUTYH, NBN, NF1, PALB2, PMS2, POLD1, POLE, PTEN, RAD50, RAD51C, RAD51D, SMAD4, SMARCA4, STK11, and TP53.  This panel also included deletion/duplication analysis (without sequencing) for two genes, EPCAM and SCG5/GREM1.  Those results are now back and have previously been discussed with Ms. Ake over the phone.  Today, she is returning to discuss these results more in person.  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 The remainder of the genes tested were negative for mutations, and no variants of uncertain significance (VUSs) were found. A copy of this test result will be scanned into Epic and will be found under the Results Review tab in the Surgical Pathology>Molecular Pathology section.  We discussed that pathogenic mutations in CHEK2 have been shown to increase the risk of breast cancer approximately 2-fold.  Or about 20-44% by age 19 (Cybulski 2011, Rincon 2008). The risk has been shown to be dependent on the family history of breast  cancer. CHEK2 mutations have also been associated with a 2-fold increased risk for colon cancer. There are other conflicting studies suggesting that mutations in CHEK2 increase the risk for various other cancers, but results have not been consistent.  MEDICAL MANAGEMENT: Currently, there are limited medical management guidelines from the NCCN for women with a CHEK2 mutation. Women should consider being seen at a high-risk breast clinic for a clinical breast exam twice per year. In addition to a yearly mammogram, the NCCN guidelines recommend a yearly breast MRI. Women may also wish to discuss the option of preventative bilateral mastectomies with their provider depending on their clinical and family histories of cancer; however, at this time there is no NCCN guideline recommendation for this specific intervention simply based on having a CHEK2 mutation. Ms. Driskill is recommended to continue having a yearly gynecologic exam and to have a colonoscopy every 5 years or more often as abnormal results indicate.  We discussed that she should get in touch with her primary care provider and make him aware of this result, so that he may ensure she is receiving the proper care and cancer screening in the future.  FAMILY MEMBERS: It is important that Ms. Alper informs her relatives of this genetic test result. It is recommended her relatives to speak with a genetic counselor prior to any genetic testing. This information will be particularly important for her daughter, son, and two brothers--all of whom would have a 50% chance of having this same mutation.  We discussed that her daughter should have genetic testing to determine whether or not breast MRI, earlier mammograms, and earlier and more frequent colonoscopies will be indicated for her.  Her son would also be considered to be at an increased risk for colon cancer if he tested positive for this CHEK2 mutation, and, so, would need to begin colonoscopies earlier  (at the age of 77, indeed all relatives who test positive for this mutation should begin colonoscopies at 51) and have those every 5 years or more often as abnormal results indicate.  More distant relatives, such as aunts and cousins are also at-risk for this same mutation.  We discussed that, based on the history of colon cancer in a PGM, that this is likely coming from her father's side.  However, until this is verified, all relatives would be eligible for genetic testing, if interested.  We are happy to help coordinate genetic counseling for any family member interested. Alternatively, a genetic counselor can be located at [email or web address]. We had a very good discussion about how Ms. Authier might tell her children about her positive genetic test result and also how she might reach out to other relatives.    We encouraged Ms. Dunton to remain in contact with [place] on an annual basis so we can update her personal and family histories, and let her know of advances in cancer genetics that may benefit the family. Our contact number was provided. Ms. Grealish's questions were answered to her satisfaction today, and she knows she and her family members are welcome to call anytime with additional questions.  We also provided her with some support resource information, including the websites for [employer] and [employer].  We discussed that we will be holding an interest meeting for a hereditary cancer support group here at the hospital sometime in November, and we would love for her to attend.  Ms. Egolf would like to receive further information when available.  Jeanine Luz, MS Genetic Counselor Phone: [phone] [email or web address]

## 2014-09-16 ENCOUNTER — Encounter (HOSPITAL_COMMUNITY): Payer: Self-pay

## 2014-11-06 ENCOUNTER — Other Ambulatory Visit (HOSPITAL_COMMUNITY): Payer: Self-pay | Admitting: Hematology & Oncology

## 2014-11-08 ENCOUNTER — Other Ambulatory Visit (HOSPITAL_COMMUNITY): Payer: Self-pay | Admitting: Oncology

## 2014-11-08 DIAGNOSIS — C50911 Malignant neoplasm of unspecified site of right female breast: Secondary | ICD-10-CM

## 2014-11-08 MED ORDER — LETROZOLE 2.5 MG PO TABS: 2.5000 mg | ORAL_TABLET | Freq: Every day | ORAL | Status: DC

## 2014-11-10 ENCOUNTER — Telehealth: Payer: Self-pay | Admitting: Genetic Counselor

## 2014-11-10 NOTE — Telephone Encounter (Signed)
Called Kelly Jacobson to discuss the hereditary support group meeting that will be at the [facility] in [place] on 11/7 at 6 PM.  This is too far for her to drive, but she would be interested in the future if we were to start a one in [place].  She updated me that she had shared her CHEK2 positive result with her children, who are waiting to get insurance to be tested and with her other family members who do not seem to be interested in being tested.  We discussed that she has done her part in sharing this information and it is up to her family members whether they proceed with genetic counseling/testing or not.

## 2014-12-01 ENCOUNTER — Other Ambulatory Visit (HOSPITAL_COMMUNITY): Payer: Self-pay

## 2014-12-01 DIAGNOSIS — C50919 Malignant neoplasm of unspecified site of unspecified female breast: Secondary | ICD-10-CM

## 2014-12-07 ENCOUNTER — Encounter (HOSPITAL_BASED_OUTPATIENT_CLINIC_OR_DEPARTMENT_OTHER): Payer: BC Managed Care – PPO

## 2014-12-07 ENCOUNTER — Encounter (HOSPITAL_COMMUNITY): Payer: BC Managed Care – PPO | Attending: Hematology & Oncology | Admitting: Hematology & Oncology

## 2014-12-07 ENCOUNTER — Encounter (HOSPITAL_COMMUNITY): Payer: Self-pay | Admitting: Hematology & Oncology

## 2014-12-07 VITALS — BP 129/67 | HR 86 | Temp 98.6°F | Resp 16 | Wt 263.4 lb

## 2014-12-07 DIAGNOSIS — C50919 Malignant neoplasm of unspecified site of unspecified female breast: Secondary | ICD-10-CM

## 2014-12-07 DIAGNOSIS — C50911 Malignant neoplasm of unspecified site of right female breast: Secondary | ICD-10-CM

## 2014-12-07 LAB — COMPREHENSIVE METABOLIC PANEL
ALBUMIN: 4.3 g/dL (ref 3.5–5.0)
ALK PHOS: 95 U/L (ref 38–126)
ALT: 29 U/L (ref 14–54)
AST: 33 U/L (ref 15–41)
Anion gap: 10 (ref 5–15)
BUN: 22 mg/dL — ABNORMAL HIGH (ref 6–20)
CALCIUM: 9.8 mg/dL (ref 8.9–10.3)
CO2: 28 mmol/L (ref 22–32)
CREATININE: 0.65 mg/dL (ref 0.44–1.00)
Chloride: 98 mmol/L — ABNORMAL LOW (ref 101–111)
GFR calc non Af Amer: >60 mL/min (ref >60)
GLUCOSE: 120 mg/dL — AB (ref 65–99)
Potassium: 3.7 mmol/L (ref 3.5–5.1)
Sodium: 136 mmol/L (ref 135–145)
TOTAL PROTEIN: 7.3 g/dL (ref 6.5–8.1)
Total Bilirubin: 0.9 mg/dL (ref 0.3–1.2)

## 2014-12-07 LAB — CBC WITH DIFFERENTIAL/PLATELET
Basophils Absolute: 0 10*3/uL (ref 0.0–0.1)
Basophils Relative: 0 %
Eosinophils Absolute: 0.2 10*3/uL (ref 0.0–0.7)
Eosinophils Relative: 2 %
HCT: 44.5 % (ref 36.0–46.0)
HEMOGLOBIN: 14.4 g/dL (ref 12.0–15.0)
LYMPHS ABS: 2.1 10*3/uL (ref 0.7–4.0)
Lymphocytes Relative: 20 %
MCH: 28 pg (ref 26.0–34.0)
MCHC: 32.4 g/dL (ref 30.0–36.0)
MCV: 86.6 fL (ref 78.0–100.0)
MONOS PCT: 5 %
Monocytes Absolute: 0.5 10*3/uL (ref 0.1–1.0)
NEUTROS ABS: 7.7 10*3/uL (ref 1.7–7.7)
NEUTROS PCT: 73 %
Platelets: 305 10*3/uL (ref 150–400)
RBC: 5.14 MIL/uL — AB (ref 3.87–5.11)
RDW: 13.8 % (ref 11.5–15.5)
WBC: 10.6 10*3/uL — ABNORMAL HIGH (ref 4.0–10.5)

## 2014-12-07 NOTE — Patient Instructions (Addendum)
[place] at [facility] Discharge Instructions  RECOMMENDATIONS MADE BY THE CONSULTANT AND ANY TEST RESULTS WILL BE SENT TO YOUR REFERRING PHYSICIAN.      Exam completed by Dr Whitney Muse today Set up for double mastectomy with your surgeon.  You had the [place] mutation Once you finish with your femara you are done with that prescription, then you can stop this medication. Mammogram scheduled before your surgery Return to see the doctor in 6 months Please call the clinic if you have any questions or concerns      Thank you for choosing [place] at [facility] to provide your oncology and hematology care.  To afford each patient quality time with our provider, please arrive at least 15 minutes before your scheduled appointment time.    You need to re-schedule your appointment should you arrive 10 or more minutes late.  We strive to give you quality time with our providers, and arriving late affects you and other patients whose appointments are after yours.  Also, if you no show three or more times for appointments you may be dismissed from the clinic at the providers discretion.     Again, thank you for choosing [facility].  Our hope is that these requests will decrease the amount of time that you wait before being seen by our physicians.       _____________________________________________________________  Should you have questions after your visit to [facility], please contact our office at [phone] between the hours of 8:30 a.m. and 4:30 p.m.  Voicemails left after 4:30 p.m. will not be returned until the following business day.  For prescription refill requests, have your pharmacy contact our office.

## 2014-12-07 NOTE — Progress Notes (Signed)
[place] [address]  Oncology History   Stage II (T1c, N1), grade 2 of right breast presenting in upper axilla with mass (actually superior portion of right arm).  1.9 cm mass with 3/26 positive nodes with extracapsular extension.  Both mets in lymph nodes were greater than 2 mm.  Negative margins.  Initial biopsy on 01/25/2009 with definitive surgery and lymph node dissection on 02/23/2009 followed by re-excision on 03/16/2009 with all margins clear.  ER 52%, PR 36%, Ki-67 33% Her2 negative.  S/P EC in dose dense fashion x 4 cycles followed by weekly Taxol.  S/P radiation Finishing in December 2011.  Now on Arimidex starting on 12/22/2009.     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  ER 62%, PR 36%, Ki-67 33%, Her 2 negative   02/23/2009 Surgery Right breast lumpectomy with sentinel node and axillary dissection demonstrating a 0.3 cm DCIS involving deep margin, + LVI, 3/26 positive lymph nodes   03/16/2009 Surgery Re-excision of margins with negative disease   05/10/2009 - 06/23/2009 Chemotherapy Epirubicin, Cytoxan x 4 cycles   07/06/2009 - 09/21/2009 Chemotherapy Taxol x 12 cycles   10/17/2009 - 12/07/2009 Radiation Therapy    12/22/2009 - 06/22/2014 Chemotherapy Anastrozole   12/25/2013 Pathology Results Breast Cancer Index- 5.9% risk of distant recurrence for ER+, lymph node negative patients after 5 years giving her a low likelihood of benefit from extended endocine therapy.   06/23/2014 -  Anti-estrogen oral therapy Letrozole 2.5 mg daily    CURRENT THERAPY: Anastrozole 1 mg daily beginning on 12/22/2009  DEXA  07/29/2013, normal bone density. Currently on Femara, to complete end of December 2016  INTERVAL HISTORY: Kelly Jacobson 54 y.o. female returns for  regular  visit  for followup of stage II right breast She is here alone today. We discussed her upcoming final date of femara and her recent genetic counseling results from 09/02/14. Without a mastectomy, she will have a yearly mammogram and breast MRI with routine follow ups in medical oncology every 6 months including clinical breast examinations.   She is not interested in breast reconstruction. We discussed the option of mastectomy at length to avoid breast cancer recurrence with her CHEK2 genetic mutation. Her concerns included how it would be covered financially, how often she would need to follow-up, how quickly she would be able to return to work, and whether she could pick her Psychologist, sport and exercise. She would prefer to have Dr. Barry Dienes perform her mastectomy. She previously had breast reduction surgery in 1991 or 1992. After much discussion, she has decided on having a mastectomy done so that she does not have to worry each time she has a mammogram performed.   We discussed that her children should be genetically tested.   She had her last colonoscopy performed here in [place] and was very pleased with the physician, Dr. Oneida Alar.   She denies headaches, bowel changes, new pain, change in appetite, or any new lumps or bumps. She previously took calcium and Vitamin D daily, however Dr. Brigitte Pulse informed her that her levels were high so she discontinued them.   She has no other complaints today.  Past Medical History  Diagnosis Date  . Diabetes mellitus   . Thyroid disease   . DM (diabetes mellitus) ([place]) 11/20/2010  . Hot flashes secondary to Arimidex 11/20/2010  .  Hypothyroidism 11/20/2010    Synthroid  . Edema     hands and feet at times, takes as a direutic  . Depression     wellburtin  . Arthritis     Hands and legs  . Anxiety     Xanax  . Varicose veins 2014  . 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     is allergic to darvocet and tape.  Ms. Cutrona had no medications administered during this visit.  Past Surgical History  Procedure Laterality Date  . Breast reduction surgery  1991  . Carpal tunnel release  04/06/1998  . Abdominal hysterectomy  2001    vaginal for endometriosis  . Debridement tennis elbow  2003  . Cervical fusion  01/15/2007  . Axillary cyst  01/25/2009  . Axillary node dissection  02/23/2009 and 03/16/2009  . Bilateral oophorectomy  04/19/2009  . Portacath placement  05/02/2009  . Trigger thumb surgery  02/01/2010  . Rectocele surgery  03/07/10  . Port-a-cath removal  01/16/2011    Procedure: REMOVAL PORT-A-CATH;  Surgeon: Stark Klein, MD;  Location: [place];  Service: General;  Laterality: N/A;  Removal port-a-cath.  . Carpal tunnel release  [id number]    lt hand  . Colonoscopy N/A 05/07/2013    Procedure: COLONOSCOPY;  Surgeon: Danie Binder, MD;  [facility];  Service: Endoscopy;  Laterality: N/A;  8:30 AM    Denies any headaches, dizziness, double vision, fevers, chills, nausea, vomiting, diarrhea, constipation, chest pain, heart palpitations, shortness of breath, blood in stool, black tarry stool, urinary pain, urinary burning, urinary frequency, hematuria. Positive for night sweats.    Hot flashes due to the Femora.    PHYSICAL EXAMINATION  ECOG PERFORMANCE STATUS: 0 - Asymptomatic  Filed Vitals:   12/07/14 1300  BP: 129/67  Pulse: 86  Temp: 98.6 F (37 C)  Resp: 16    GENERAL:alert, no distress, well nourished, well developed, comfortable, cooperative, obese and smiling SKIN: skin color, texture, turgor are normal, no rashes or significant lesions HEAD: Normocephalic, No masses, lesions, tenderness  or abnormalities EYES: normal, PERRLA, EOMI, Conjunctiva are pink and non-injected EARS: External ears normal OROPHARYNX: mucous membranes are moist  NECK: supple, trachea midline LYMPH:  No palpable adenopathy in the neck, supraclavicular area, or axilla. BREAST: Bilateral reduction, lollipop procedure.  LUNGS: clear to auscultation  HEART: regular rate & rhythm ABDOMEN:abdomen soft, obese and normal bowel sounds BACK: Back symmetric, no curvature. EXTREMITIES:less then 2 second capillary refill, no joint deformities, effusion, or inflammation, no skin discoloration, no cyanosis  Left leg has varicose veins. NEURO: alert & oriented x 3 with fluent speech, no focal motor/sensory deficits, gait normal   LABORATORY DATA: I have reviewed the data as listed. CBC    Component Value Date/Time   WBC 10.6* 12/07/2014 1322   RBC 5.14* 12/07/2014 1322   HGB 14.4 12/07/2014 1322   HCT 44.5 12/07/2014 1322   PLT 305 12/07/2014 1322   MCV 86.6 12/07/2014 1322   MCH 28.0 12/07/2014 1322   MCHC 32.4 12/07/2014 1322   RDW 13.8 12/07/2014 1322   LYMPHSABS 2.1 12/07/2014 1322   MONOABS 0.5 12/07/2014 1322  EOSABS 0.2 12/07/2014 1322   BASOSABS 0.0 12/07/2014 1322      Chemistry      Component Value Date/Time   NA 137 08/04/2014 1327   K 3.7 08/04/2014 1327   CL 98* 08/04/2014 1327   CO2 29 08/04/2014 1327   BUN 15 08/04/2014 1327   CREATININE 0.82 08/04/2014 1327      Component Value Date/Time   CALCIUM 9.8 08/04/2014 1327   CALCIUM 9.3 12/22/2013 0957   ALKPHOS 100 08/04/2014 1327   AST 53* 08/04/2014 1327   ALT 39 08/04/2014 1327   BILITOT 0.9 08/04/2014 1327       RADIOGRAPHIC STUDIES: I have personally reviewed the radiological images as listed and agreed with the findings in the report.  CLINICAL DATA: History of malignant lumpectomy of the right breast and 2011. History of bilateral breast reductions. No current complaints.  EXAM: DIGITAL DIAGNOSTIC BILATERAL  MAMMOGRAM WITH CAD  COMPARISON: Prior exams  ACR Breast Density Category b: There are scattered areas of fibroglandular density.  FINDINGS: There are no discrete masses. Architectural distortion reflecting postsurgical scarring is noted in the far posterior upper outer right breast, stable. No other architectural distortion. No suspicious calcifications.  Mammographic images were processed with CAD.  IMPRESSION: No evidence of recurrent or new malignancy. Benign postsurgical scarring on the right, stable.  RECOMMENDATION: Diagnostic mammography in 1 year per standard post lumpectomy protocol.  I have discussed the findings and recommendations with the patient. Results were also provided in writing at the conclusion of the visit. If applicable, a reminder letter will be sent to the patient regarding the next appointment.  BI-RADS CATEGORY 2: Benign Finding(s)   Electronically Signed  By: Lajean Manes M.D.  On: 12/11/2013 09:14  CLINICAL DATA: Hepatic steatosis  EXAM: US ABDOMEN LIMITED - RIGHT UPPER QUADRANT  COMPARISON: MR abdomen September 25, 2011  FINDINGS: Gallbladder:  No gallstones or wall thickening visualized. There is no pericholecystic fluid. No sonographic Murphy sign noted.  Common bile duct:  Diameter: 2 mm. There is no intrahepatic or extrahepatic biliary duct dilatation  Liver:  No focal lesion identified. Liver echogenicity is diffusely increased. Liver is prominent measuring approximately 21 cm in length.  IMPRESSION: Prominent liver with increased liver echogenicity, most likely due to hepatic steatosis. While no focal liver lesions are identified, it must be cautioned that the sensitivity of ultrasound for focal liver lesions is diminished in this circumstance. Study otherwise unremarkable.   Electronically Signed  By: Lowella Grip III M.D.  On: 06/21/2014 09:45   ASSESSMENT:  1. Stage II  right breast cancer R breast 2. 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 3. Obesity 4. Normal bone density on 07/29/2013 by Dr. Brigitte Pulse. 5. Fatty infiltration of liver   Patient Active Problem List   Diagnosis Date Noted  . Family history of colon cancer 09/02/2014  . Personal history of breast cancer 09/02/2014  . Family history of cancer 09/02/2014  . 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  . Genetic testing 08/30/2014  . Hypothyroidism 11/20/2010  . DM (diabetes mellitus) ([place]) 11/20/2010  . Hot flashes secondary to Arimidex 11/20/2010  . 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 A BCI suggests no additional benefit from 10 years of endocrine therapy. She notes it would be quite difficult for her to tolerate longer therapy  as her hot flashes have been debilitating at times.  She is due for a mammogram bilaterally after 12/4. I will go ahead and put in an order for one.  We spent a significant amount of time discussing the results of her genetic testing.  I explained what we know and don't know about CHEK2 mutation. I reviewed screening guidelines with her in detail.  I explained that screening is to detect cancer early it is NOT prevention, the only way to prevent or reduce recurrence is through mastectomy, Daci understands this.    She wishes to proceed with mastectomies without reconstruction. She would like to have the surgery performed by Dr. Barry Dienes.   She has had a hysterectomy.  She is up to date with colonoscopy screening.  We discussed that her children should also be genetically tested and she was given information about this.  I will see her again in 6 months for routine follow-up.   All questions were answered. The patient knows to call the clinic with any problems, questions or  concerns. We can certainly see the patient much sooner if necessary.  This document serves as a record of services personally performed by Ancil Linsey, MD. It was created on her behalf by Arlyce Harman, a trained medical scribe. The creation of this record is based on the scribe's personal observations and the provider's statements to them. This document has been checked and approved by the attending provider.  I have reviewed the above documentation for accuracy and completeness, and I agree with the above.  This note was electronically signed. Molli Hazard, MD  12/07/2014

## 2014-12-08 NOTE — Progress Notes (Signed)
Labs drawn

## 2014-12-10 ENCOUNTER — Other Ambulatory Visit: Payer: Self-pay | Admitting: General Surgery

## 2014-12-17 ENCOUNTER — Ambulatory Visit (HOSPITAL_COMMUNITY)
Admission: RE | Admit: 2014-12-17 | Discharge: 2014-12-17 | Disposition: A | Discharge status: Home / Self Care | Payer: BC Managed Care – PPO | Source: Ambulatory Visit | Attending: Oncology | Admitting: Oncology

## 2014-12-17 DIAGNOSIS — C50911 Malignant neoplasm of unspecified site of right female breast: Secondary | ICD-10-CM | POA: Insufficient documentation

## 2014-12-17 DIAGNOSIS — Z79899 Other long term (current) drug therapy: Secondary | ICD-10-CM | POA: Diagnosis not present

## 2014-12-17 DIAGNOSIS — Z79811 Long term (current) use of aromatase inhibitors: Secondary | ICD-10-CM

## 2014-12-17 DIAGNOSIS — Z13820 Encounter for screening for osteoporosis: Secondary | ICD-10-CM | POA: Insufficient documentation

## 2014-12-17 DIAGNOSIS — Z0000 Encounter for general adult medical examination without abnormal findings: Secondary | ICD-10-CM

## 2014-12-28 ENCOUNTER — Ambulatory Visit (HOSPITAL_COMMUNITY)
Admission: RE | Admit: 2014-12-28 | Discharge: 2014-12-28 | Disposition: A | Discharge status: Home / Self Care | Payer: BC Managed Care – PPO | Source: Ambulatory Visit | Attending: Hematology & Oncology | Admitting: Hematology & Oncology

## 2014-12-28 DIAGNOSIS — Z853 Personal history of malignant neoplasm of breast: Secondary | ICD-10-CM | POA: Insufficient documentation

## 2014-12-28 DIAGNOSIS — C50911 Malignant neoplasm of unspecified site of right female breast: Secondary | ICD-10-CM

## 2014-12-29 ENCOUNTER — Encounter (HOSPITAL_COMMUNITY): Payer: BC Managed Care – PPO

## 2015-01-05 NOTE — Patient Instructions (Addendum)
YOUR PROCEDURE IS SCHEDULED ON :  01/12/14  REPORT TO [facility] MAIN ENTRANCE FOLLOW SIGNS TO EAST ELEVATOR - GO TO 3rd FLOOR CHECK IN AT 3 EAST NURSES STATION (SHORT STAY) AT:  10:30 AM  CALL THIS NUMBER IF YOU HAVE PROBLEMS THE MORNING OF SURGERY [phone]  REMEMBER:ONLY 1 PER PERSON MAY GO TO SHORT STAY WITH YOU TO GET READY THE MORNING OF YOUR SURGERY  DO NOT EAT FOOD OR DRINK LIQUIDS AFTER MIDNIGHT  TAKE THESE MEDICINES THE MORNING OF SURGERY: VENLAFAXINE / ATROVASTATIN / LEVOTHYROXINE / BUPROPION  STOP ASPIRIN / IBUPROFEN / ALEVE / VITAMINS / HERBAL MEDS __7__ DAYS BEFORE SURGERY  YOU MAY NOT HAVE ANY METAL ON YOUR BODY INCLUDING HAIR PINS AND PIERCING'S. DO NOT WEAR JEWELRY, MAKEUP, LOTIONS, POWDERS OR PERFUMES. DO NOT WEAR NAIL POLISH. DO NOT SHAVE 48 HRS PRIOR TO SURGERY. MEN MAY SHAVE FACE AND NECK.  DO NOT [facility] IS NOT RESPONSIBLE FOR VALUABLES.  CONTACTS, DENTURES OR PARTIALS MAY NOT BE WORN TO SURGERY. LEAVE SUITCASE IN CAR. CAN BE BROUGHT TO ROOM AFTER SURGERY.  PATIENTS DISCHARGED THE DAY OF SURGERY WILL NOT BE ALLOWED TO DRIVE HOME.  PLEASE READ OVER THE FOLLOWING INSTRUCTION SHEETS _________________________________________________________________________________                                          [facility] - PREPARING FOR SURGERY  Before surgery, you can play an important role.  Because skin is not sterile, your skin needs to be as free of germs as possible.  You can reduce the number of germs on your skin by washing with CHG (chlorahexidine gluconate) soap before surgery.  CHG is an antiseptic cleaner which kills germs and bonds with the skin to continue killing germs even after washing. Please DO NOT use if you have an allergy to CHG or antibacterial soaps.  If your skin becomes reddened/irritated stop using the CHG and inform your nurse when you arrive at Short Stay. Do not shave (including legs and  underarms) for at least 48 hours prior to the first CHG shower.  You may shave your face. Please follow these instructions carefully:   1.  Shower with CHG Soap the night before surgery and the  morning of Surgery.   2.  If you choose to wash your hair, wash your hair first as usual with your  normal  Shampoo.   3.  After you shampoo, rinse your hair and body thoroughly to remove the  shampoo.                                         4.  Use CHG as you would any other liquid soap.  You can apply chg directly  to the skin and wash . Gently wash with scrungie or clean wascloth    5.  Apply the CHG Soap to your body ONLY FROM THE NECK DOWN.   Do not use on open                           Wound or open sores. Avoid contact with eyes, ears mouth and genitals (private parts).  Genitals (private parts) with your normal soap.              6.  Wash thoroughly, paying special attention to the area where your surgery  will be performed.   7.  Thoroughly rinse your body with warm water from the neck down.   8.  DO NOT shower/wash with your normal soap after using and rinsing off  the CHG Soap .                9.  Pat yourself dry with a clean towel.             10.  Wear clean night clothes to bed after shower             11.  Place clean sheets on your bed the night of your first shower and do not  sleep with pets.  Day of Surgery : Do not apply any lotions/deodorants the morning of surgery.  Please wear clean clothes to the hospital/surgery center.  FAILURE TO FOLLOW THESE INSTRUCTIONS MAY RESULT IN THE CANCELLATION OF YOUR SURGERY    PATIENT SIGNATURE_________________________________  ______________________________________________________________________  MAY HAVE CLEAR LIQUIDS UNTIL 6:30 AM  CLEAR LIQUID DIET   Foods Allowed                                                                     Foods Excluded  Coffee and tea, regular and decaf                              liquids that you cannot  Plain Jell-O in any flavor                                             see through such as: Fruit ices (not with fruit pulp)                                     milk, soups, orange juice  Iced Popsicles                                                       All solid food Carbonated beverages, regular and diet                                    Cranberry, grape and apple juices Sports drinks like Gatorade Lightly seasoned clear broth or consume(fat free) Sugar, honey syrup

## 2015-01-07 ENCOUNTER — Encounter (HOSPITAL_COMMUNITY): Payer: Self-pay

## 2015-01-07 ENCOUNTER — Encounter (HOSPITAL_COMMUNITY)
Admission: RE | Admit: 2015-01-07 | Discharge: 2015-01-07 | Disposition: A | Discharge status: Home / Self Care | Payer: BC Managed Care – PPO | Source: Ambulatory Visit | Attending: General Surgery | Admitting: General Surgery

## 2015-01-07 DIAGNOSIS — Z853 Personal history of malignant neoplasm of breast: Secondary | ICD-10-CM | POA: Insufficient documentation

## 2015-01-07 DIAGNOSIS — Z01818 Encounter for other preprocedural examination: Secondary | ICD-10-CM | POA: Insufficient documentation

## 2015-01-07 HISTORY — DX: Hyperlipidemia, unspecified: E78.5

## 2015-01-07 HISTORY — DX: Other complications of anesthesia, initial encounter: T88.59XA

## 2015-01-07 HISTORY — DX: Adverse effect of unspecified anesthetic, initial encounter: T41.45XA

## 2015-01-07 LAB — BASIC METABOLIC PANEL
Anion gap: 7 (ref 5–15)
BUN: 18 mg/dL (ref 6–20)
CHLORIDE: 103 mmol/L (ref 101–111)
CO2: 30 mmol/L (ref 22–32)
Calcium: 9.4 mg/dL (ref 8.9–10.3)
Creatinine, Ser: 0.59 mg/dL (ref 0.44–1.00)
GFR calc non Af Amer: >60 mL/min (ref >60)
Glucose, Bld: 170 mg/dL — ABNORMAL HIGH (ref 65–99)
POTASSIUM: 4 mmol/L (ref 3.5–5.1)
SODIUM: 140 mmol/L (ref 135–145)

## 2015-01-07 LAB — CBC
HEMATOCRIT: 44.3 % (ref 36.0–46.0)
HEMOGLOBIN: 14.2 g/dL (ref 12.0–15.0)
MCH: 27.6 pg (ref 26.0–34.0)
MCHC: 32.1 g/dL (ref 30.0–36.0)
MCV: 86.2 fL (ref 78.0–100.0)
Platelets: 271 10*3/uL (ref 150–400)
RBC: 5.14 MIL/uL — AB (ref 3.87–5.11)
RDW: 13.7 % (ref 11.5–15.5)
WBC: 10.1 10*3/uL (ref 4.0–10.5)

## 2015-01-08 LAB — HEMOGLOBIN A1C
Hgb A1c MFr Bld: 6.7 % — ABNORMAL HIGH (ref 4.8–5.6)
Mean Plasma Glucose: 146 mg/dL

## 2015-01-12 MED ORDER — DEXTROSE 5 % IV SOLN
3.0000 g | INTRAVENOUS | Status: AC
  Administered 2015-01-13 (×2): 3 g via INTRAVENOUS
  Filled 2015-01-12 (×2): qty 3000

## 2015-01-12 NOTE — H&P (Signed)
Janalyn Shy. [facility]  Location: [facility] Surgery Patient #: [id number] DOB: 1960-08-08 Single / Language: Cleophus Molt / Race: White Female   History of Present Illness The patient is a 55 year old female who presents with breast cancer. Patient is a 55 year old female referred for consultation by Dr. Whitney Muse regarding a new diagnosis of a genetic predisposition for breast cancer. The patient underwent a right axillary lymph node dissection in 2011 for a isolated axillary presentation of right breast cancer. There were 3 of 26 lymph nodes involved. She did receive chemotherapy with dose dense before meals followed by Taxol. She also had radiation therapy. She has been taking antiestrogen therapy since December 2011.  When she followed up with Dr. Whitney Muse, she went to genetics. 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 She had a colonoscopy. She has kept up-to-date with her mammograms. Last mammogram was December 2015. She has not had any new changes in her breast.     Medication History No Current Medications Medications Reconciled    Review of Systems All other systems negative  Vitals   Weight: 263.2 lb Height: 70in Body Surface Area: 2.35 m Body Mass Index: 37.76 kg/m  Temp.: 97.3F  Pulse: 78 (Regular)  Resp.: 16 (Unlabored)  P.OX: 98% (Room air) BP: 147/80 (Sitting, Left Arm, Standard)   Physical Exam  General Mental Status-Alert. General Appearance-Consistent with stated age. Hydration-Well hydrated. Voice-Normal.  Head and Neck Head-normocephalic, atraumatic with no lesions or palpable masses. Trachea-midline. Thyroid Gland Characteristics - normal size and consistency.  Eye Eyeball - Bilateral-Extraocular movements intact. Sclera/Conjunctiva - Bilateral-No scleral icterus.  Chest and Lung Exam Chest and lung exam reveals -quiet, even and easy respiratory effort with no use of accessory muscles and on auscultation,  normal breath sounds, no adventitious sounds and normal vocal resonance. Inspection Chest Wall - Normal. Back - normal.  Breast Note: prior breast reduction. Incision in the right axilla as expected. no breast masses, no nipple retraction or skin dimpling. No nipple discharge. No lymphadenopathy.   Cardiovascular Cardiovascular examination reveals -normal heart sounds, regular rate and rhythm with no murmurs and normal pedal pulses bilaterally.  Abdomen Inspection Inspection of the abdomen reveals - No Hernias. Palpation/Percussion Palpation and Percussion of the abdomen reveal - Soft, Non Tender, No Rebound tenderness, No Rigidity (guarding) and No hepatosplenomegaly. Auscultation Auscultation of the abdomen reveals - Bowel sounds normal.  Neurologic Neurologic evaluation reveals -alert and oriented x 3 with no impairment of recent or remote memory. Mental Status-Normal.  Musculoskeletal Global Assessment -Note: no gross deformities.  Normal Exam - Left-Upper Extremity Strength Normal and Lower Extremity Strength Normal. Normal Exam - Right-Upper Extremity Strength Normal and Lower Extremity Strength Normal.  Lymphatic Head & Neck  General Head & Neck Lymphatics: Bilateral - Description - Normal. Axillary  General Axillary Region: Bilateral - Description - Normal. Tenderness - Non Tender. Femoral & Inguinal  Generalized Femoral & Inguinal Lymphatics: Bilateral - Description - No Generalized lymphadenopathy.    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  I recommend bilateral mastectomies in order to minimize her risk of developing a new breast cancer or a breast cancer recurrence.  She will need to get her December mammograms prior to surgery to make sure there is nothing new that needs to be worked out prior to  surgery. Otherwise I will plan to do bilateral total mastectomies. The patient does not  desire reconstruction. She would like to get this done this calendar year if at all possible.  I reviewed mastectomies with the patient. I discussed the incision location and type. I reviewed the risks of surgery including bleeding, infection, damage to adjacent structures, pain, possible need for additional procedures or surgeries, possible blood clot, possible heart or lung complications, possible death. Patient understands and wishes to proceed. We do not need to address her lymph nodes in any way unless her new mammograms demonstrate an unexpected finding. Current Plans Pt Education - Breast Removal (Mastectomy): breast Pt Education - [facility] Mastectomy [facility] You are being scheduled for surgery - Our schedulers will call you.  You should hear from our office's scheduling department within 5 working days about the location, date, and time of surgery. We try to make accommodations for patient's preferences in scheduling surgery, but sometimes the OR schedule or the surgeon's schedule prevents [place] from making those accommodations.  If you have not heard from our office [phone]) in 5 working days, call the office and ask for your surgeon's nurse.  If you have other questions about your diagnosis, plan, or surgery, call the office and ask for your surgeon's nurse.    Signed by Stark Klein, MD

## 2015-01-13 ENCOUNTER — Ambulatory Visit (HOSPITAL_COMMUNITY): Payer: BC Managed Care – PPO | Admitting: Anesthesiology

## 2015-01-13 ENCOUNTER — Encounter (HOSPITAL_COMMUNITY)
Admission: AD | Disposition: A | Discharge status: Home / Self Care | Payer: Self-pay | Source: Ambulatory Visit | Attending: General Surgery

## 2015-01-13 ENCOUNTER — Encounter (HOSPITAL_COMMUNITY): Payer: Self-pay | Admitting: *Deleted

## 2015-01-13 ENCOUNTER — Inpatient Hospital Stay (HOSPITAL_COMMUNITY)
Admission: AD | Admit: 2015-01-13 | Discharge: 2015-01-15 | DRG: 581 | Disposition: A | Discharge status: Home / Self Care | Payer: BC Managed Care – PPO | Source: Ambulatory Visit | Attending: General Surgery | Admitting: General Surgery

## 2015-01-13 DIAGNOSIS — Z853 Personal history of malignant neoplasm of breast: Secondary | ICD-10-CM

## 2015-01-13 DIAGNOSIS — E119 Type 2 diabetes mellitus without complications: Secondary | ICD-10-CM | POA: Diagnosis present

## 2015-01-13 DIAGNOSIS — Z01812 Encounter for preprocedural laboratory examination: Secondary | ICD-10-CM

## 2015-01-13 DIAGNOSIS — C50919 Malignant neoplasm of unspecified site of unspecified female breast: Principal | ICD-10-CM | POA: Diagnosis present

## 2015-01-13 DIAGNOSIS — Z01810 Encounter for preprocedural cardiovascular examination: Secondary | ICD-10-CM | POA: Diagnosis not present

## 2015-01-13 DIAGNOSIS — E039 Hypothyroidism, unspecified: Secondary | ICD-10-CM | POA: Diagnosis present

## 2015-01-13 DIAGNOSIS — E669 Obesity, unspecified: Secondary | ICD-10-CM | POA: Diagnosis present

## 2015-01-13 DIAGNOSIS — Z6837 Body mass index (BMI) 37.0-37.9, adult: Secondary | ICD-10-CM | POA: Diagnosis not present

## 2015-01-13 DIAGNOSIS — Z1501 Genetic susceptibility to malignant neoplasm of breast: Secondary | ICD-10-CM | POA: Diagnosis present

## 2015-01-13 HISTORY — PX: TOTAL MASTECTOMY: SHX6129

## 2015-01-13 LAB — GLUCOSE, CAPILLARY
GLUCOSE-CAPILLARY: 153 mg/dL — AB (ref 65–99)
Glucose-Capillary: 140 mg/dL — ABNORMAL HIGH (ref 65–99)
Glucose-Capillary: 180 mg/dL — ABNORMAL HIGH (ref 65–99)

## 2015-01-13 SURGERY — MASTECTOMY, SIMPLE
Anesthesia: Regional | Site: Breast | Laterality: Bilateral

## 2015-01-13 MED ORDER — FENTANYL CITRATE (PF) 100 MCG/2ML IJ SOLN: 25.0000 ug | INTRAMUSCULAR | Status: DC | PRN

## 2015-01-13 MED ORDER — MIDAZOLAM HCL 5 MG/5ML IJ SOLN
INTRAMUSCULAR | Status: DC | PRN
  Administered 2015-01-13 (×2): 1 mg via INTRAVENOUS

## 2015-01-13 MED ORDER — LACTATED RINGERS IV SOLN
INTRAVENOUS | Status: DC | PRN
  Administered 2015-01-13 (×3): via INTRAVENOUS

## 2015-01-13 MED ORDER — PROMETHAZINE HCL 25 MG/ML IJ SOLN: 6.2500 mg | INTRAMUSCULAR | Status: DC | PRN

## 2015-01-13 MED ORDER — LEVOTHYROXINE SODIUM 50 MCG PO TABS
50.0000 ug | ORAL_TABLET | Freq: Every day | ORAL | Status: DC
  Administered 2015-01-14 – 2015-01-15 (×2): 50 ug via ORAL
  Filled 2015-01-13 (×3): qty 1

## 2015-01-13 MED ORDER — FENTANYL CITRATE (PF) 100 MCG/2ML IJ SOLN
INTRAMUSCULAR | Status: DC | PRN
  Administered 2015-01-13: 25 ug via INTRAVENOUS
  Administered 2015-01-13 (×2): 50 ug via INTRAVENOUS
  Administered 2015-01-13 (×2): 25 ug via INTRAVENOUS
  Administered 2015-01-13: 50 ug via INTRAVENOUS
  Administered 2015-01-13 (×6): 25 ug via INTRAVENOUS
  Administered 2015-01-13: 50 ug via INTRAVENOUS
  Administered 2015-01-13: 25 ug via INTRAVENOUS
  Administered 2015-01-13: 50 ug via INTRAVENOUS

## 2015-01-13 MED ORDER — OXYCODONE HCL 5 MG PO TABS: 5.0000 mg | ORAL_TABLET | Freq: Once | ORAL | Status: DC | PRN

## 2015-01-13 MED ORDER — ZOLPIDEM TARTRATE 5 MG PO TABS
5.0000 mg | ORAL_TABLET | Freq: Every evening | ORAL | Status: DC | PRN
  Administered 2015-01-15: 5 mg via ORAL
  Filled 2015-01-13: qty 1

## 2015-01-13 MED ORDER — HYDROMORPHONE HCL 1 MG/ML IJ SOLN
INTRAMUSCULAR | Status: AC
  Filled 2015-01-13: qty 1

## 2015-01-13 MED ORDER — VENLAFAXINE HCL 37.5 MG PO TABS
37.5000 mg | ORAL_TABLET | Freq: Every day | ORAL | Status: DC
  Administered 2015-01-14 – 2015-01-15 (×2): 37.5 mg via ORAL
  Filled 2015-01-13 (×3): qty 1

## 2015-01-13 MED ORDER — METFORMIN HCL 500 MG PO TABS
1000.0000 mg | ORAL_TABLET | Freq: Every day | ORAL | Status: DC
  Administered 2015-01-14 – 2015-01-15 (×2): 1000 mg via ORAL
  Filled 2015-01-13 (×3): qty 2

## 2015-01-13 MED ORDER — ONDANSETRON HCL 4 MG/2ML IJ SOLN
INTRAMUSCULAR | Status: AC
  Filled 2015-01-13: qty 2

## 2015-01-13 MED ORDER — LIRAGLUTIDE 18 MG/3ML ~~LOC~~ SOPN: 1.8000 mg | PEN_INJECTOR | Freq: Every day | SUBCUTANEOUS | Status: DC

## 2015-01-13 MED ORDER — LIDOCAINE HCL (CARDIAC) 20 MG/ML IV SOLN
INTRAVENOUS | Status: AC
  Filled 2015-01-13: qty 5

## 2015-01-13 MED ORDER — MIDAZOLAM HCL 2 MG/2ML IJ SOLN
INTRAMUSCULAR | Status: AC
  Filled 2015-01-13: qty 2

## 2015-01-13 MED ORDER — DIPHENHYDRAMINE HCL 50 MG/ML IJ SOLN: 12.5000 mg | Freq: Four times a day (QID) | INTRAMUSCULAR | Status: DC | PRN

## 2015-01-13 MED ORDER — HYDRALAZINE HCL 20 MG/ML IJ SOLN: 10.0000 mg | INTRAMUSCULAR | Status: DC | PRN

## 2015-01-13 MED ORDER — DEXAMETHASONE SODIUM PHOSPHATE 10 MG/ML IJ SOLN
INTRAMUSCULAR | Status: AC
  Filled 2015-01-13: qty 1

## 2015-01-13 MED ORDER — DIPHENHYDRAMINE HCL 12.5 MG/5ML PO ELIX: 12.5000 mg | ORAL_SOLUTION | Freq: Four times a day (QID) | ORAL | Status: DC | PRN

## 2015-01-13 MED ORDER — CEFAZOLIN SODIUM-DEXTROSE 2-3 GM-% IV SOLR
2.0000 g | Freq: Three times a day (TID) | INTRAVENOUS | Status: AC
  Administered 2015-01-14: 2 g via INTRAVENOUS
  Filled 2015-01-13: qty 50

## 2015-01-13 MED ORDER — DEXTROSE 5 % IV SOLN
3.0000 g | INTRAVENOUS | Status: DC
  Filled 2015-01-13: qty 3000

## 2015-01-13 MED ORDER — OXYCODONE HCL 5 MG/5ML PO SOLN
5.0000 mg | Freq: Once | ORAL | Status: DC | PRN
  Filled 2015-01-13: qty 5

## 2015-01-13 MED ORDER — FENTANYL CITRATE (PF) 100 MCG/2ML IJ SOLN
INTRAMUSCULAR | Status: AC
  Filled 2015-01-13: qty 2

## 2015-01-13 MED ORDER — ALPRAZOLAM 0.25 MG PO TABS
0.2500 mg | ORAL_TABLET | Freq: Every day | ORAL | Status: DC | PRN
  Administered 2015-01-14: 0.25 mg via ORAL
  Filled 2015-01-13: qty 1

## 2015-01-13 MED ORDER — BUPIVACAINE-EPINEPHRINE (PF) 0.25% -1:200000 IJ SOLN
INTRAMUSCULAR | Status: AC
  Filled 2015-01-13: qty 30

## 2015-01-13 MED ORDER — BUPIVACAINE-EPINEPHRINE (PF) 0.5% -1:200000 IJ SOLN
INTRAMUSCULAR | Status: DC | PRN
  Administered 2015-01-13 (×2): 30 mL via PERINEURAL

## 2015-01-13 MED ORDER — SIMETHICONE 80 MG PO CHEW
40.0000 mg | CHEWABLE_TABLET | Freq: Four times a day (QID) | ORAL | Status: DC | PRN
  Filled 2015-01-13: qty 1

## 2015-01-13 MED ORDER — DIPHENHYDRAMINE HCL 25 MG PO CAPS
25.0000 mg | ORAL_CAPSULE | Freq: Every evening | ORAL | Status: DC | PRN
  Administered 2015-01-13: 25 mg via ORAL
  Filled 2015-01-13: qty 1

## 2015-01-13 MED ORDER — PROPOFOL 10 MG/ML IV BOLUS
INTRAVENOUS | Status: DC | PRN
  Administered 2015-01-13: 200 mg via INTRAVENOUS

## 2015-01-13 MED ORDER — DEXAMETHASONE SODIUM PHOSPHATE 10 MG/ML IJ SOLN
INTRAMUSCULAR | Status: DC | PRN
  Administered 2015-01-13: 5 mg via INTRAVENOUS

## 2015-01-13 MED ORDER — METHOCARBAMOL 500 MG PO TABS
500.0000 mg | ORAL_TABLET | Freq: Four times a day (QID) | ORAL | Status: DC | PRN
  Administered 2015-01-13 – 2015-01-15 (×2): 500 mg via ORAL
  Filled 2015-01-13 (×2): qty 1

## 2015-01-13 MED ORDER — ATORVASTATIN CALCIUM 20 MG PO TABS
20.0000 mg | ORAL_TABLET | Freq: Every day | ORAL | Status: DC
  Administered 2015-01-13 – 2015-01-15 (×3): 20 mg via ORAL
  Filled 2015-01-13 (×3): qty 1

## 2015-01-13 MED ORDER — LETROZOLE 2.5 MG PO TABS
2.5000 mg | ORAL_TABLET | Freq: Every day | ORAL | Status: DC
  Filled 2015-01-13: qty 1

## 2015-01-13 MED ORDER — LORAZEPAM 2 MG/ML IJ SOLN
1.0000 mg | Freq: Once | INTRAMUSCULAR | Status: DC | PRN
  Filled 2015-01-13: qty 0.5

## 2015-01-13 MED ORDER — MUPIROCIN 2 % EX OINT
1.0000 | TOPICAL_OINTMENT | Freq: Every day | CUTANEOUS | Status: DC
Start: 2015-01-14 — End: 2015-01-15

## 2015-01-13 MED ORDER — IRBESARTAN 150 MG PO TABS
150.0000 mg | ORAL_TABLET | Freq: Every day | ORAL | Status: DC
  Administered 2015-01-13 – 2015-01-15 (×3): 150 mg via ORAL
  Filled 2015-01-13 (×3): qty 1

## 2015-01-13 MED ORDER — BUPIVACAINE-EPINEPHRINE (PF) 0.5% -1:200000 IJ SOLN
INTRAMUSCULAR | Status: AC
  Filled 2015-01-13: qty 60

## 2015-01-13 MED ORDER — BUPROPION HCL ER (SR) 100 MG PO TB12
200.0000 mg | ORAL_TABLET | Freq: Every day | ORAL | Status: DC
  Administered 2015-01-14 – 2015-01-15 (×2): 200 mg via ORAL
  Filled 2015-01-13 (×3): qty 2

## 2015-01-13 MED ORDER — SODIUM CHLORIDE 0.9 % IJ SOLN
INTRAMUSCULAR | Status: AC
  Filled 2015-01-13: qty 100

## 2015-01-13 MED ORDER — ACETAMINOPHEN 160 MG/5ML PO SOLN: 325.0000 mg | ORAL | Status: DC | PRN

## 2015-01-13 MED ORDER — CEFAZOLIN SODIUM-DEXTROSE 2-3 GM-% IV SOLR
3.0000 g | Freq: Once | INTRAVENOUS | Status: DC
  Filled 2015-01-13: qty 100

## 2015-01-13 MED ORDER — ONDANSETRON HCL 4 MG/2ML IJ SOLN: 4.0000 mg | Freq: Four times a day (QID) | INTRAMUSCULAR | Status: DC | PRN

## 2015-01-13 MED ORDER — ONDANSETRON HCL 4 MG/2ML IJ SOLN
INTRAMUSCULAR | Status: DC | PRN
  Administered 2015-01-13: 4 mg via INTRAVENOUS

## 2015-01-13 MED ORDER — IBUPROFEN 800 MG PO TABS
800.0000 mg | ORAL_TABLET | Freq: Three times a day (TID) | ORAL | Status: DC | PRN
  Administered 2015-01-14 (×2): 800 mg via ORAL
  Filled 2015-01-13 (×2): qty 1

## 2015-01-13 MED ORDER — ONDANSETRON 4 MG PO TBDP: 4.0000 mg | ORAL_TABLET | Freq: Four times a day (QID) | ORAL | Status: DC | PRN

## 2015-01-13 MED ORDER — ACETAMINOPHEN 650 MG RE SUPP: 650.0000 mg | Freq: Four times a day (QID) | RECTAL | Status: DC | PRN

## 2015-01-13 MED ORDER — SODIUM CHLORIDE 0.9 % IJ SOLN
INTRAMUSCULAR | Status: DC | PRN
  Administered 2015-01-13: 90 mL via INTRAVENOUS

## 2015-01-13 MED ORDER — LIDOCAINE HCL (CARDIAC) 20 MG/ML IV SOLN
INTRAVENOUS | Status: DC | PRN
  Administered 2015-01-13: 100 mg via INTRAVENOUS

## 2015-01-13 MED ORDER — INSULIN GLARGINE 100 UNIT/ML ~~LOC~~ SOLN
30.0000 [IU] | Freq: Every day | SUBCUTANEOUS | Status: DC
  Administered 2015-01-14 – 2015-01-15 (×2): 30 [IU] via SUBCUTANEOUS
  Filled 2015-01-13 (×4): qty 0.3

## 2015-01-13 MED ORDER — LIDOCAINE HCL 1 % IJ SOLN
INTRAMUSCULAR | Status: AC
  Filled 2015-01-13: qty 20

## 2015-01-13 MED ORDER — INSULIN ASPART 100 UNIT/ML ~~LOC~~ SOLN
0.0000 [IU] | Freq: Three times a day (TID) | SUBCUTANEOUS | Status: DC
  Administered 2015-01-14 – 2015-01-15 (×4): 3 [IU] via SUBCUTANEOUS

## 2015-01-13 MED ORDER — OXYCODONE HCL 5 MG PO TABS
5.0000 mg | ORAL_TABLET | ORAL | Status: DC | PRN
  Administered 2015-01-13 – 2015-01-15 (×5): 10 mg via ORAL
  Filled 2015-01-13 (×7): qty 2

## 2015-01-13 MED ORDER — ACETAMINOPHEN 325 MG PO TABS: 650.0000 mg | ORAL_TABLET | Freq: Four times a day (QID) | ORAL | Status: DC | PRN

## 2015-01-13 MED ORDER — ALBUTEROL SULFATE (2.5 MG/3ML) 0.083% IN NEBU
3.0000 mL | INHALATION_SOLUTION | Freq: Four times a day (QID) | RESPIRATORY_TRACT | Status: DC | PRN
Start: 2015-01-13 — End: 2015-01-15

## 2015-01-13 MED ORDER — BUPIVACAINE-EPINEPHRINE 0.25% -1:200000 IJ SOLN
INTRAMUSCULAR | Status: DC | PRN
  Administered 2015-01-13: 30 mL

## 2015-01-13 MED ORDER — HYDROMORPHONE HCL 1 MG/ML IJ SOLN
0.2500 mg | INTRAMUSCULAR | Status: DC | PRN
  Administered 2015-01-13 (×3): 0.5 mg via INTRAVENOUS

## 2015-01-13 MED ORDER — DOCUSATE SODIUM 100 MG PO CAPS
100.0000 mg | ORAL_CAPSULE | Freq: Two times a day (BID) | ORAL | Status: DC
  Administered 2015-01-13 – 2015-01-15 (×3): 100 mg via ORAL
  Filled 2015-01-13 (×5): qty 1

## 2015-01-13 MED ORDER — KCL IN DEXTROSE-NACL 20-5-0.45 MEQ/L-%-% IV SOLN
INTRAVENOUS | Status: AC
  Administered 2015-01-13: 22:00:00 via INTRAVENOUS
  Filled 2015-01-13: qty 1000

## 2015-01-13 MED ORDER — ACETAMINOPHEN 325 MG PO TABS: 325.0000 mg | ORAL_TABLET | ORAL | Status: DC | PRN

## 2015-01-13 MED ORDER — 0.9 % SODIUM CHLORIDE (POUR BTL) OPTIME
TOPICAL | Status: DC | PRN
  Administered 2015-01-13: 3000 mL

## 2015-01-13 MED ORDER — HYDROMORPHONE HCL 1 MG/ML IJ SOLN
0.5000 mg | INTRAMUSCULAR | Status: DC | PRN
  Administered 2015-01-13 (×2): 0.5 mg via INTRAVENOUS
  Filled 2015-01-13 (×2): qty 1

## 2015-01-13 MED ORDER — CANAGLIFLOZIN 100 MG PO TABS
300.0000 mg | ORAL_TABLET | Freq: Every day | ORAL | Status: DC
  Administered 2015-01-14 – 2015-01-15 (×2): 300 mg via ORAL
  Filled 2015-01-13 (×2): qty 3

## 2015-01-13 MED ORDER — ROCURONIUM BROMIDE 100 MG/10ML IV SOLN
INTRAVENOUS | Status: AC
  Filled 2015-01-13: qty 1

## 2015-01-13 MED ORDER — PROPOFOL 10 MG/ML IV BOLUS
INTRAVENOUS | Status: AC
  Filled 2015-01-13: qty 20

## 2015-01-13 SURGICAL SUPPLY — 51 items
BINDER BREAST LRG (GAUZE/BANDAGES/DRESSINGS) IMPLANT
BINDER BREAST XLRG (GAUZE/BANDAGES/DRESSINGS) ×4 IMPLANT
BLADE SURG SZ10 CARB STEEL (BLADE) IMPLANT
BNDG COHESIVE 4X5 TAN STRL (GAUZE/BANDAGES/DRESSINGS) IMPLANT
BNDG GAUZE ELAST 4 BULKY (GAUZE/BANDAGES/DRESSINGS) IMPLANT
CHLORAPREP W/TINT 26ML (MISCELLANEOUS) ×4 IMPLANT
CLIP TI MEDIUM 6 (CLIP) ×8 IMPLANT
CLIP TI WIDE RED SMALL 6 (CLIP) IMPLANT
CONT SPEC 4OZ CLIKSEAL STRL BL (MISCELLANEOUS) ×2 IMPLANT
COVER SURGICAL LIGHT HANDLE (MISCELLANEOUS) IMPLANT
DECANTER SPIKE VIAL GLASS SM (MISCELLANEOUS) ×2 IMPLANT
DERMABOND ADVANCED (GAUZE/BANDAGES/DRESSINGS) ×2
DERMABOND ADVANCED .7 DNX12 (GAUZE/BANDAGES/DRESSINGS) ×2 IMPLANT
DRAIN CHANNEL 19F RND (DRAIN) ×4 IMPLANT
DRAPE SHEET LG 3/4 BI-LAMINATE (DRAPES) ×2 IMPLANT
DRAPE UTILITY XL STRL (DRAPES) ×2 IMPLANT
ELECT BLADE TIP CTD 4 INCH (ELECTRODE) ×2 IMPLANT
ELECT REM PT RETURN 9FT ADLT (ELECTROSURGICAL) ×2
ELECTRODE REM PT RTRN 9FT ADLT (ELECTROSURGICAL) ×1 IMPLANT
EVACUATOR SILICONE 100CC (DRAIN) ×4 IMPLANT
GAUZE SPONGE 4X4 12PLY STRL (GAUZE/BANDAGES/DRESSINGS) ×4 IMPLANT
GLOVE BIO SURGEON STRL SZ 6 (GLOVE) ×2 IMPLANT
GLOVE INDICATOR 6.5 STRL GRN (GLOVE) ×2 IMPLANT
GOWN STRL REUS W/ TWL XL LVL3 (GOWN DISPOSABLE) ×6 IMPLANT
GOWN STRL REUS W/TWL XL LVL3 (GOWN DISPOSABLE) ×6
KIT BASIN OR (CUSTOM PROCEDURE TRAY) ×2 IMPLANT
MARKER SKIN DUAL TIP RULER LAB (MISCELLANEOUS) ×2 IMPLANT
NEEDLE HYPO 22GX1.5 SAFETY (NEEDLE) IMPLANT
NEEDLE SPNL 22GX3.5 QUINCKE BK (NEEDLE) ×2 IMPLANT
PACK GENERAL/GYN (CUSTOM PROCEDURE TRAY) ×2 IMPLANT
PACK UNIVERSAL I (CUSTOM PROCEDURE TRAY) ×2 IMPLANT
PAD ABD 8X10 STRL (GAUZE/BANDAGES/DRESSINGS) ×4 IMPLANT
SPONGE DRAIN TRACH 4X4 STRL 2S (GAUZE/BANDAGES/DRESSINGS) ×4 IMPLANT
SPONGE LAP 18X18 X RAY DECT (DISPOSABLE) ×6 IMPLANT
STAPLER VISISTAT 35W (STAPLE) IMPLANT
STOCKINETTE 8 INCH (MISCELLANEOUS) IMPLANT
STRIP CLOSURE SKIN 1/2X4 (GAUZE/BANDAGES/DRESSINGS) ×10 IMPLANT
SUT ETHILON 2 0 PS N (SUTURE) ×6 IMPLANT
SUT MNCRL AB 4-0 PS2 18 (SUTURE) ×8 IMPLANT
SUT SILK 2 0 (SUTURE) ×1
SUT SILK 2 0 SH (SUTURE) ×2 IMPLANT
SUT SILK 2-0 18XBRD TIE 12 (SUTURE) ×1 IMPLANT
SUT SILK 3 0 (SUTURE)
SUT SILK 3-0 18XBRD TIE 12 (SUTURE) IMPLANT
SUT VIC AB 3-0 SH 27 (SUTURE)
SUT VIC AB 3-0 SH 27XBRD (SUTURE) IMPLANT
SUT VIC AB 3-0 SH 8-18 (SUTURE) ×16 IMPLANT
SYR 20CC LL (SYRINGE) IMPLANT
SYRINGE 60CC LL (MISCELLANEOUS) ×2 IMPLANT
TOWEL OR 17X26 10 PK STRL BLUE (TOWEL DISPOSABLE) ×2 IMPLANT
TOWEL OR NON WOVEN STRL DISP B (DISPOSABLE) ×2 IMPLANT

## 2015-01-13 NOTE — Anesthesia Procedure Notes (Addendum)
Anesthesia Regional Block:  Pectoralis block  Pre-Anesthetic Checklist: ,, timeout performed, Correct Patient, Correct Site, Correct Laterality, Correct Procedure, Correct Position, site marked, Risks and benefits discussed,  Surgical consent,  Pre-op evaluation,  At surgeon's request and post-op pain management  Laterality: Right  Prep: chloraprep       Needles:  Injection technique: Single-shot  Needle Type: Echogenic Stimulator Needle          Additional Needles:  Procedures: ultrasound guided (picture in chart) Pectoralis block Narrative:  Injection made incrementally with aspirations every 5 mL.  Performed by: Personally  Anesthesiologist: MOSER, CHRISTOPHER  Additional Notes: H+P and labs reviewed, risks and benefits discussed with patient, procedure tolerated well without complications   Anesthesia Regional Block:  Pectoralis block  Pre-Anesthetic Checklist: ,, timeout performed, Correct Patient, Correct Site, Correct Laterality, Correct Procedure, Correct Position, site marked, Risks and benefits discussed,  Surgical consent,  Pre-op evaluation,  At surgeon's request and post-op pain management  Laterality: Left  Prep: chloraprep       Needles:  Injection technique: Single-shot  Needle Type: Echogenic Stimulator Needle          Additional Needles:  Procedures: ultrasound guided (picture in chart) Pectoralis block Narrative:  Injection made incrementally with aspirations every 5 mL.  Performed by: Personally  Anesthesiologist: Oleta Mouse  Additional Notes: H+P and labs reviewed, risks and benefits discussed with patient, procedure tolerated well without complications   Procedure Name: LMA Insertion Date/Time: 01/13/2015 2:06 PM Performed by: Freddie Breech Pre-anesthesia Checklist: Patient identified, Emergency Drugs available, Suction available, Patient being monitored and Timeout performed Patient Re-evaluated:Patient  Re-evaluated prior to inductionOxygen Delivery Method: Circle system utilized Preoxygenation: Pre-oxygenation with 100% oxygen Intubation Type: IV induction LMA: LMA inserted LMA Size: 3.0 Number of attempts: 1 Airway Equipment and Method: Patient positioned with wedge pillow Placement Confirmation: ETT inserted through vocal cords under direct vision,  positive ETCO2 and breath sounds checked- equal and bilateral Tube secured with: Tape

## 2015-01-13 NOTE — Discharge Instructions (Signed)
[facility] °[phone] ° °MASTECTOMY: POST OP INSTRUCTIONS ° °Always review your discharge instruction sheet given to you by the facility where your surgery was performed. °IF YOU HAVE DISABILITY OR FAMILY LEAVE FORMS, YOU MUST BRING THEM TO THE OFFICE FOR PROCESSING.   °DO NOT GIVE THEM TO YOUR DOCTOR. °A prescription for pain medication may be given to you upon discharge.  Take your pain medication as prescribed, if needed.  If narcotic pain medicine is not needed, then you may take acetaminophen (Tylenol) or ibuprofen (Advil) as needed. °1. Take your usually prescribed medications unless otherwise directed. °2. If you need a refill on your pain medication, please contact your pharmacy.  They will contact our office to request authorization.  Prescriptions will not be filled after 5pm or on week-ends. °3. You should follow a light diet the first few days after arrival home, such as soup and crackers, etc.  Resume your normal diet the day after surgery. °4. Most patients will experience some swelling and bruising on the chest and underarm.  Ice packs will help.  Swelling and bruising can take several days to resolve.  °5. It is common to experience some constipation if taking pain medication after surgery.  Increasing fluid intake and taking a stool softener (such as Colace) will usually help or prevent this problem from occurring.  A mild laxative (Milk of Magnesia or Miralax) should be taken according to package instructions if there are no bowel movements after 48 hours. °6. Unless discharge instructions indicate otherwise, leave your bandage dry and in place until your next appointment in 3-5 days.  You may take a limited sponge bath.  No tube baths or showers until the drains are removed.  You may have steri-strips (small skin tapes) in place directly over the incision.  These strips should be left on the skin for 7-10 days.  If your surgeon used skin glue on the incision, you may  shower in 24 hours.  The glue will flake off over the next 2-3 weeks.  Any sutures or staples will be removed at the office during your follow-up visit. °7. DRAINS:  If you have drains in place, it is important to keep a list of the amount of drainage produced each day in your drains.  Before leaving the hospital, you should be instructed on drain care.  Call our office if you have any questions about your drains. °8. ACTIVITIES:  You may resume regular (light) daily activities beginning the next day--such as daily self-care, walking, climbing stairs--gradually increasing activities as tolerated.  You may have sexual intercourse when it is comfortable.  Refrain from any heavy lifting or straining until approved by your doctor. °a. You may drive when you are no longer taking prescription pain medication, you can comfortably wear a seatbelt, and you can safely maneuver your car and apply brakes. °b. RETURN TO WORK:  __________________________________________________________ °9. You should see your doctor in the office for a follow-up appointment approximately 3-5 days after your surgery.  Your doctor’s nurse will typically make your follow-up appointment when she calls you with your pathology report.  Expect your pathology report 2-3 business days after your surgery.  You may call to check if you do not hear from us after three days.   °10. OTHER INSTRUCTIONS: ______________________________________________________________________________________________ ____________________________________________________________________________________________ °WHEN TO CALL YOUR DOCTOR: °1. Fever over 101.0 °2. Nausea and/or vomiting °3. Extreme swelling or bruising °4. Continued bleeding from incision. °5. Increased pain, redness, or drainage from the incision. °  The clinic staff is available to answer your questions during regular business hours.  Please don’t hesitate to call and ask to speak to one of the nurses for clinical  concerns.  If you have a medical emergency, go to the nearest emergency room or call 911.  A surgeon from [facility] is always on call at the hospital. °[address], [address], [place], [place]  [address] ? [address] [address], [place], [place]   [address] °[phone] ? [phone] ? FAX [phone] °Web site: [email or web address] °

## 2015-01-13 NOTE — Interval H&P Note (Signed)
History and Physical Interval Note:  01/13/2015 1:55 PM  Kelly Jacobson  has presented today for surgery, with the diagnosis of HISTORY OF BREAST CANCER AND GENETIC PREDISPOSITION TO BREAST CANCER CHEK2 MUTATION  The various methods of treatment have been discussed with the patient and family. After consideration of risks, benefits and other options for treatment, the patient has consented to  Procedure(s): BILATERAL MASTECTOMY (Bilateral) as a surgical intervention .  The patient's history has been reviewed, patient examined, no change in status, stable for surgery.  I have reviewed the patient's chart and labs.  Questions were answered to the patient's satisfaction.     Dryden Tapley

## 2015-01-13 NOTE — Op Note (Signed)
Bilateral Mastectomies  Indications: This patient presents with history of right breast cancer 6 years ago in the axilla, and she had an ALND.  She has subsequently been found to have a genetic defect (CHEK-2) and has had multiple call backs for repeat mammo/ultrasound.  She desires mastectomy bilaterally.  Pre-operative Diagnosis: genetic predisposition to breast cancer, history of right breast cancer  Post-operative Diagnosis:  same  Surgeon: Stark Klein   Assistant:  Orest Dikes, RNFA; Sherlean Foot, RNFA  Anesthesia: General endotracheal anesthesia and Local anesthesia 0.25.% bupivacaine, with epinephrine  ASA Class: 3  Procedure Details  The patient was seen in the Holding Room. The risks, benefits, complications, treatment options, and expected outcomes were discussed with the patient. The possibilities of reaction to medication, pulmonary aspiration, bleeding, infection, the need for additional procedures, failure to diagnose a condition, and creating a complication requiring transfusion or operation were discussed with the patient. The patient concurred with the proposed plan, giving informed consent.  The site of surgery properly noted/marked. The patient was taken to Operating Room # 1, identified as Genika Tsutsumi and the procedure verified as Bilateral Mastectomies. A Time Out was held and the above information confirmed.    After induction of anesthesia, the bilateral breast and chest were prepped and draped in standard fashion.   The borders of the breast were identified and marked.  The incisions of the breast were drawn out to make sure incision lines were equidistant in length.  The superior incision was made with the #10 blade.  The marcaine/saline mixture was infiltrated into the superior flap.  Mastectomy hooks were used to provide elevation of the skin edges, and the curved Mayo scissors and the cautery were used to create the mastectomy flaps.  The dissection was taken to  the fascia of the pectoralis major.  The penetrating vessels were clipped.  The superior flap was taken medially to the lateral sternal border, superiorly to the inferior border of the clavicle.  The inferior flap was similarly created, inferiorly to the inframammary fold and laterally to the border of the latissimus.  The breast was taken off including the pectoralis fascia and the axillary tail marked.  There was a grossly enlarged lymph node in the axilla and this was sent as a frozen section.  This was negative for carcinoma.    The wound was irrigated.  One 19 Blake drain was placed laterally.   Hemostasis was achieved with cautery. The wound had to be teed off in order to lay down flat.   The wound was irrigated and closed with a 3-0 Vicryl deep dermal interrupted sutures and 4-0 Vicryl subcuticular closure in layers.  The right breast was addressed similarly.  She had a prior ALND on the right and had less tissue laterally, so this side did not require a conversion to a T.      Sterile dressings were applied. At the end of the operation, all sponge, instrument, and needle counts were correct.  Findings: grossly clear surgical margins  Estimated Blood Loss:  less than 50 mL         Drains: 2 19 Blakes (one left and one right)                Specimens: left axillary lymph node, left breast, right breast.           Complications:  None; patient tolerated the procedure well.         Disposition: PACU - hemodynamically stable.  Condition: stable

## 2015-01-13 NOTE — Transfer of Care (Signed)
Immediate Anesthesia Transfer of Care Note  Patient: Kelly Jacobson  Procedure(s) Performed: Procedure(s): BILATERAL MASTECTOMY (Bilateral)  Patient Location: PACU  Anesthesia Type:General  Level of Consciousness: awake, alert , oriented and patient cooperative  Airway & Oxygen Therapy: Patient Spontanous Breathing and Patient connected to face mask oxygen  Post-op Assessment: Report given to RN and Post -op Vital signs reviewed and stable  Post vital signs: Reviewed and stable  Last Vitals:  Filed Vitals:   01/13/15 1011  Pulse: 84  Temp: 36.3 C  Resp: 18    Complications: No apparent anesthesia complications

## 2015-01-13 NOTE — Anesthesia Preprocedure Evaluation (Addendum)
Anesthesia Evaluation  Patient identified by MRN, date of birth, ID band Patient awake    Reviewed: Allergy & Precautions, NPO status , Patient's Chart, lab work & pertinent test results  History of Anesthesia Complications Negative for: history of anesthetic complications  Airway Mallampati: II  TM Distance: >3 FB Neck ROM: Full    Dental no notable dental hx. (+) Dental Advisory Given   Pulmonary neg pulmonary ROS,    breath sounds clear to auscultation       Cardiovascular negative cardio ROS   Rhythm:Regular Rate:Normal     Neuro/Psych PSYCHIATRIC DISORDERS Anxiety Depression negative neurological ROS     GI/Hepatic negative GI ROS, Neg liver ROS,   Endo/Other  diabetes, Type 2, Insulin Dependent, Oral Hypoglycemic AgentsHypothyroidism Morbid obesityObesity, bordering on morbid obesity  Renal/GU      Musculoskeletal  (+) Arthritis ,   Abdominal   Peds  Hematology negative hematology ROS (+) Breast cancer with hx of right lumpectomy in 2011   Anesthesia Other Findings   Reproductive/Obstetrics                            Anesthesia Physical Anesthesia Plan  ASA: III  Anesthesia Plan: General and Regional   Post-op Pain Management: GA combined w/ Regional for post-op pain   Induction: Intravenous  Airway Management Planned: Oral ETT  Additional Equipment: None  Intra-op Plan:   Post-operative Plan: Extubation in OR  Informed Consent: I have reviewed the patients History and Physical, chart, labs and discussed the procedure including the risks, benefits and alternatives for the proposed anesthesia with the patient or authorized representative who has indicated his/her understanding and acceptance.   Dental advisory given  Plan Discussed with: Anesthesiologist, CRNA and Surgeon  Anesthesia Plan Comments: (Plan for bilateral PECS block preop, GA with ETT, oral tylenol,  toradol at end of procedure if surgery okay)        Anesthesia Quick Evaluation

## 2015-01-14 ENCOUNTER — Encounter (HOSPITAL_COMMUNITY): Payer: Self-pay | Admitting: *Deleted

## 2015-01-14 LAB — BASIC METABOLIC PANEL
ANION GAP: 6 (ref 5–15)
BUN: 14 mg/dL (ref 6–20)
CHLORIDE: 99 mmol/L — AB (ref 101–111)
CO2: 31 mmol/L (ref 22–32)
Calcium: 9.1 mg/dL (ref 8.9–10.3)
Creatinine, Ser: 0.76 mg/dL (ref 0.44–1.00)
GFR calc non Af Amer: >60 mL/min (ref >60)
Glucose, Bld: 255 mg/dL — ABNORMAL HIGH (ref 65–99)
Potassium: 4.9 mmol/L (ref 3.5–5.1)
Sodium: 136 mmol/L (ref 135–145)

## 2015-01-14 LAB — GLUCOSE, CAPILLARY
GLUCOSE-CAPILLARY: 153 mg/dL — AB (ref 65–99)
GLUCOSE-CAPILLARY: 159 mg/dL — AB (ref 65–99)
GLUCOSE-CAPILLARY: 186 mg/dL — AB (ref 65–99)
Glucose-Capillary: 200 mg/dL — ABNORMAL HIGH (ref 65–99)

## 2015-01-14 LAB — CBC
HEMATOCRIT: 41 % (ref 36.0–46.0)
HEMOGLOBIN: 12.7 g/dL (ref 12.0–15.0)
MCH: 27.8 pg (ref 26.0–34.0)
MCHC: 31 g/dL (ref 30.0–36.0)
MCV: 89.7 fL (ref 78.0–100.0)
Platelets: 290 10*3/uL (ref 150–400)
RBC: 4.57 MIL/uL (ref 3.87–5.11)
RDW: 14 % (ref 11.5–15.5)
WBC: 14.4 10*3/uL — AB (ref 4.0–10.5)

## 2015-01-14 NOTE — Progress Notes (Signed)
Patient resting in bed with daughter in room.  No complaints of pain at this time.

## 2015-01-14 NOTE — Progress Notes (Signed)
1 Day Post-Op  Subjective: Patient had a very rough night.  She had a lot of pain.  This has improved a little this morning, but is still bad.    Objective: Vital signs in last 24 hours: Temp:  [97.7 F (36.5 C)-100.1 F (37.8 C)] 98.6 F (37 C) (01/06 1356) Pulse Rate:  [73-112] 95 (01/06 1356) Resp:  [9-24] 18 (01/06 1356) BP: (94-147)/(52-87) 103/66 mmHg (01/06 1356) SpO2:  [95 %-99 %] 97 % (01/06 1356) Last BM Date: 01/13/15  Intake/Output from previous day: 01/05 0701 - 01/06 0700 In: 3013.3 [I.V.:2963.3; IV Piggyback:50] Out: [id number] [Urine:2750; Drains:210; Blood:200] Intake/Output this shift: Total I/O In: 120 [P.O.:120] Out: 700 [Urine:600; Drains:100]  General appearance: alert, cooperative and mild distress Chest wall: bilateral chest wall tenderness as expected. Extremities: no edema.  Lab Results:   Recent Labs  01/14/15 0434  WBC 14.4*  HGB 12.7  HCT 41.0  PLT 290   BMET  Recent Labs  01/14/15 0434  NA 136  K 4.9  CL 99*  CO2 31  GLUCOSE 255*  BUN 14  CREATININE 0.76  CALCIUM 9.1   PT/INR No results for input(s): LABPROT, INR in the last 72 hours. ABG No results for input(s): PHART, HCO3 in the last 72 hours.  Invalid input(s): PCO2, PO2  Studies/Results: No results found.  Anti-infectives: Anti-infectives    Start     Dose/Rate Route Frequency Ordered Stop   01/14/15 0200  ceFAZolin (ANCEF) IVPB 2 g/50 mL premix     2 g 100 mL/hr over 30 Minutes Intravenous 3 times per day 01/13/15 2012 01/14/15 0127   01/13/15 1800  ceFAZolin (ANCEF) IVPB 2 g/50 mL premix  Status:  Discontinued     3 g 150 mL/hr over 30 Minutes Intravenous  Once 01/13/15 1709 01/13/15 1749   01/13/15 1748  ceFAZolin (ANCEF) 3 g in dextrose 5 % 50 mL IVPB  Status:  Discontinued     3 g 160 mL/hr over 30 Minutes Intravenous 30 min pre-op 01/13/15 1748 01/13/15 1959   01/13/15 0600  ceFAZolin (ANCEF) 3 g in dextrose 5 % 50 mL IVPB     3 g 160 mL/hr over 30 Minutes  Intravenous 30 min pre-op 01/12/15 1328 01/13/15 1758      Assessment/Plan: s/p Procedure(s): BILATERAL MASTECTOMY (Bilateral) Pt with continued pain and poor support at home will need another night    LOS: 1 day    [facility] 01/14/2015

## 2015-01-15 LAB — BASIC METABOLIC PANEL
ANION GAP: 7 (ref 5–15)
BUN: 21 mg/dL — ABNORMAL HIGH (ref 6–20)
CALCIUM: 8.9 mg/dL (ref 8.9–10.3)
CO2: 31 mmol/L (ref 22–32)
CREATININE: 0.61 mg/dL (ref 0.44–1.00)
Chloride: 100 mmol/L — ABNORMAL LOW (ref 101–111)
GFR calc Af Amer: >60 mL/min (ref >60)
GLUCOSE: 197 mg/dL — AB (ref 65–99)
Potassium: 3.9 mmol/L (ref 3.5–5.1)
Sodium: 138 mmol/L (ref 135–145)

## 2015-01-15 LAB — CBC
HEMATOCRIT: 40.8 % (ref 36.0–46.0)
HEMOGLOBIN: 12.4 g/dL (ref 12.0–15.0)
MCH: 27.4 pg (ref 26.0–34.0)
MCHC: 30.4 g/dL (ref 30.0–36.0)
MCV: 90.1 fL (ref 78.0–100.0)
Platelets: 256 10*3/uL (ref 150–400)
RBC: 4.53 MIL/uL (ref 3.87–5.11)
RDW: 13.9 % (ref 11.5–15.5)
WBC: 10.1 10*3/uL (ref 4.0–10.5)

## 2015-01-15 LAB — GLUCOSE, CAPILLARY
Glucose-Capillary: 163 mg/dL — ABNORMAL HIGH (ref 65–99)
Glucose-Capillary: 178 mg/dL — ABNORMAL HIGH (ref 65–99)
Glucose-Capillary: 179 mg/dL — ABNORMAL HIGH (ref 65–99)

## 2015-01-15 MED ORDER — OXYCODONE HCL 5 MG PO TABS: 5.0000 mg | ORAL_TABLET | Freq: Four times a day (QID) | ORAL | Status: DC | PRN

## 2015-01-15 NOTE — Discharge Summary (Signed)
Physician Discharge Summary  Patient ID:  Kelly Jacobson  MRN: [id number]  DOB/AGE: 1960/03/20 55 y.o.  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 female whose primary care physician is [name], MD and who was admitted 01/13/2015 with a chief complaint of history of breast cancer and genetic predisposition to breast cancer.   She was brought to the operating room on 01/13/2015 and underwent  Bilateral mastectomies.   She is now 2 days post op and ready to go home  The discharge instructions were reviewed with the patient.  Consults: None  Significant Diagnostic Studies: Results for orders placed or performed during the hospital encounter of 01/13/15  Glucose, capillary  Result Value Ref Range   Glucose-Capillary 140 (H) 65 - 99 mg/dL   Comment 1 Notify RN   Glucose, capillary  Result Value Ref Range   Glucose-Capillary 180 (H) 65 - 99 mg/dL   Comment 1 Notify RN    Comment 2 Document in Chart   CBC  Result Value Ref Range   WBC 14.4 (H) 4.0 - 10.5 K/uL   RBC 4.57 3.87 - 5.11 MIL/uL   Hemoglobin 12.7 12.0 - 15.0 g/dL   HCT 41.0 36.0 - 46.0 %   MCV 89.7 78.0 - 100.0 fL   MCH 27.8 26.0 - 34.0 pg   MCHC 31.0 30.0 - 36.0 g/dL   RDW 14.0 11.5 - 15.5 %   Platelets 290 150 - 400 K/uL  Basic metabolic panel  Result Value Ref Range   Sodium 136 135 - 145 mmol/L   Potassium 4.9 3.5 - 5.1 mmol/L   Chloride 99 (L) 101 - 111 mmol/L   CO2 31 22 - 32 mmol/L   Glucose, Bld 255 (H) 65 - 99 mg/dL   BUN 14 6 - 20 mg/dL   Creatinine, Ser 0.76 0.44 - 1.00 mg/dL   Calcium 9.1 8.9 - 10.3 mg/dL    GFR calc non Af Amer >60 >60 mL/min   GFR calc Af Amer >60 >60 mL/min   Anion gap 6 5 - 15  Glucose, capillary  Result Value Ref Range   Glucose-Capillary 153 (H) 65 - 99 mg/dL  Glucose, capillary  Result Value Ref Range   Glucose-Capillary 200 (H) 65 - 99 mg/dL  Glucose, capillary  Result Value Ref Range   Glucose-Capillary 153 (H) 65 - 99 mg/dL  CBC  Result Value Ref Range   WBC 10.1 4.0 - 10.5 K/uL   RBC 4.53 3.87 - 5.11 MIL/uL   Hemoglobin 12.4 12.0 - 15.0 g/dL   HCT 40.8 36.0 - 46.0 %   MCV 90.1 78.0 - 100.0 fL   MCH 27.4 26.0 - 34.0 pg   MCHC 30.4 30.0 - 36.0 g/dL   RDW 13.9 11.5 - 15.5 %   Platelets 256 150 - 400 K/uL  Basic metabolic panel  Result Value Ref Range   Sodium 138 135 - 145 mmol/L   Potassium 3.9 3.5 - 5.1 mmol/L   Chloride 100 (L) 101 - 111 mmol/L   CO2 31 22 - 32 mmol/L   Glucose, Bld 197 (H) 65 - 99 mg/dL   BUN 21 (  H) 6 - 20 mg/dL   Creatinine, Ser 0.61 0.44 - 1.00 mg/dL   Calcium 8.9 8.9 - 10.3 mg/dL   GFR calc non Af Amer >60 >60 mL/min   GFR calc Af Amer >60 >60 mL/min   Anion gap 7 5 - 15  Glucose, capillary  Result Value Ref Range   Glucose-Capillary 159 (H) 65 - 99 mg/dL  Glucose, capillary  Result Value Ref Range   Glucose-Capillary 186 (H) 65 - 99 mg/dL  Glucose, capillary  Result Value Ref Range   Glucose-Capillary 163 (H) 65 - 99 mg/dL  Glucose, capillary  Result Value Ref Range   Glucose-Capillary 178 (H) 65 - 99 mg/dL    Dg Bone Density  12/17/2014  EXAM: DUAL X-RAY ABSORPTIOMETRY (DXA) FOR BONE MINERAL DENSITY IMPRESSION: Ordering Physician:  Dr. Baird Cancer, Your patient [name] completed a BMD test on 12/17/2014 using the [place] (software version: 14.10) manufactured by [employer]. The following summarizes the results of our evaluation. PATIENT BIOGRAPHICAL: Name: Kelly Jacobson, Kelly Jacobson Patient ID: [id number] Birth Date: 12/31/1960 Height: 71.0 in. Gender: Female Exam Date: 12/17/2014  Weight: 263.0 lbs. Indications: Bilateral Oophrectomy, Caucasian, Diabetic-insulin dependent, Hx Breast Ca, Post Menopausal Fractures: Treatments: Aromatase, Asprin, Multivitamin, Synthroid, Vitamin D DENSITOMETRY RESULTS: Site      Region     Measured Date Measured Age WHO Classification Young Adult T-score BMD         %Change vs. Previous Significant Change (*) AP Spine L1-L4 (L3) 12/17/2014 52.0 Normal 0.1 1.180 g/cm2 - - DualFemur Neck Right 12/17/2014 52.0 Normal -0.4 0.980 g/cm2 - - ASSESSMENT: BMD as determined from Femur Neck Right is 0.980 g/cm2 with a T-Score of -0.4. This patient is considered normal according to [facility]) criteria. (L-3 was excluded due to advanced degenerative changes.)(Patient does not meet criteria for FRAX assessment.) World Health Organization [facility]) criteria for post-menopausal, Caucasian Women: Normal:       T-score at or above -1 SD Osteopenia:   T-score between -1 and -2.5 SD Osteoporosis: T-score at or below -2.5 SD RECOMMENDATIONS: [place] recommends that FDA-approved medial therapies be considered in postmenopausal women and men age 46 or older with a: 1. Hip or vertebral (clinical or morphometric) fracture. 2. T-Score of < -2.5 at the spine or hip. 3. Ten-year fracture probability by FRAX of 3% or greater for hip fracture or 20% or greater for major osteoporotic fracture. All treatment decisions require clinical judgment and consideration of individual patient factors, including patient preferences, co-morbidities, previous drug use, risk factors not captured in the FRAX model (e.g. falls, vitamin D deficiency, increased bone turnover, interval significant decline in bone density) and possible under-or over-estimation of fracture risk by FRAX. All patients should ensure an adequate intake of dietary calcium (1200 mg/d) and vitamin D (800 IU daily) unless contraindicated. FOLLOW-UP: People with diagnosed cases of osteoporosis or  osteopenia should be regularly tested for bone mineral density. For patients eligible for Medicare, routine testing is allowed once every 2 years. Testing frequency can be increased for patients who have rapidly progressing disease, or for those who are receiving medical therapy to restore bone mass. I have reviewed this report, and agree with the above findings. [facility] Electronically Signed   By: Tamie Minteer  [place] M.D.   On: 12/17/2014 11:56   Mm Digital Diagnostic Bilat  12/28/2014  CLINICAL DATA:  History of right breast cancer. Patient is status post lumpectomy and reduction mammoplasty. She  is planning on bilateral mastectomy. EXAM: DIGITAL DIAGNOSTIC BILATERAL MAMMOGRAM WITH CAD COMPARISON:  Previous exam(s). ACR Breast Density Category b: There are scattered areas of fibroglandular density. FINDINGS: There are no suspicious masses, areas of nonsurgical architectural distortion or microcalcifications in either breast. Bilateral postsurgical changes are stable. Mammographic images were processed with CAD. IMPRESSION: No mammographic evidence of malignancy in either breast. RECOMMENDATION: Screening mammogram in one year, if the patient does not have a mastectomy.(Code:SM-B-01Y) I have discussed the findings and recommendations with the patient. Results were also provided in writing at the conclusion of the visit. If applicable, a reminder letter will be sent to the patient regarding the next appointment. BI-RADS CATEGORY  2: Benign Finding(s) Electronically Signed   By: Fidela Salisbury M.D.   On: 12/28/2014 10:34    Discharge Exam:  Filed Vitals:   01/14/15 2150 01/15/15 0526  BP: 107/57 105/61  Pulse: 88 86  Temp: 97.6 F (36.4 C) 98.8 F (37.1 C)  Resp: 18 18    General: Heavy WF who is alert and generally healthy appearing.  Lungs: Clear to auscultation and symmetric breath sounds. Heart:  RRR. No murmur or rub.  Chest:  Both incisions look good  Drains L/R -  145/52 cc last 24 hours  Extremities:  Good strength and ROM  in upper and lower extremities.   Discharge Medications:     Medication List    TAKE these medications        ALPRAZolam 0.25 MG tablet  Commonly known as:  XANAX  Take 0.25 mg by mouth daily as needed for anxiety.     aspirin 325 MG tablet  Take 325 mg by mouth daily.     atorvastatin 20 MG tablet  Commonly known as:  LIPITOR  Take 20 mg by mouth daily.     BENADRYL 25 MG tablet  Generic drug:  diphenhydrAMINE  Take 25 mg by mouth at bedtime as needed for allergies.     buPROPion 200 MG 12 hr tablet  Commonly known as:  WELLBUTRIN SR  Take 200 mg by mouth daily.     ferrous sulfate 325 (65 FE) MG tablet  Take 325 mg by mouth daily with breakfast.     HYDROcodone-acetaminophen 5-325 MG tablet  Commonly known as:  NORCO/VICODIN  Take 1 tablet by mouth every 6 (six) hours as needed. pain     ibuprofen 800 MG tablet  Commonly known as:  ADVIL,MOTRIN  Take 800 mg by mouth every 8 (eight) hours as needed (pain). pain     INVOKANA 300 MG Tabs tablet  Generic drug:  canagliflozin  Take 300 mg by mouth daily.     LANTUS SOLOSTAR 100 UNIT/ML Solostar Pen  Generic drug:  Insulin Glargine  Inject 30 Units into the skin daily.     letrozole 2.5 MG tablet  Commonly known as:  FEMARA  Take 1 tablet (2.5 mg total) by mouth daily.     levothyroxine 50 MCG tablet  Commonly known as:  SYNTHROID, LEVOTHROID  Take 50 mcg by mouth daily.     metFORMIN 1000 MG tablet  Commonly known as:  GLUCOPHAGE  Take 1,000 mg by mouth daily with breakfast.     multivitamin with minerals Tabs tablet  Take 1 tablet by mouth daily.     mupirocin ointment 2 %  Commonly known as:  BACTROBAN  Place 1 application into the nose daily.     oxyCODONE 5 MG immediate release tablet  Commonly known as:  Oxy IR/ROXICODONE  Take 1-2 tablets (5-10 mg total) by mouth every 6 (six) hours as needed for moderate pain.     PROAIR HFA 108  (90 Base) MCG/ACT inhaler  Generic drug:  albuterol  Inhale 2 puffs into the lungs every 6 (six) hours as needed for wheezing or shortness of breath.     telmisartan 40 MG tablet  Commonly known as:  MICARDIS  Take 40 mg by mouth daily.     venlafaxine 37.5 MG tablet  Commonly known as:  EFFEXOR  Take 37.5 mg by mouth daily.     VICTOZA 18 MG/3ML Soln injection  Generic drug:  Liraglutide  Inject 1.8 mLs into the skin daily.     Vitamin D3 2000 units Tabs  Take 2,000 Units by mouth daily.     vitamin E 1000 UNIT capsule  Take 1,000 Units by mouth daily.     zolpidem 12.5 MG CR tablet  Commonly known as:  AMBIEN CR  Take 12.5 mg by mouth at bedtime as needed for sleep.        Disposition: 01-Home or Self Care      Discharge Instructions    Diet - low sodium heart healthy    Complete by:  As directed      Increase activity slowly    Complete by:  As directed            Follow-up Information    Follow up with [facility], MD In 2 weeks.   Specialty:  General Surgery   Contact information:   [address] [place] [address] [phone]        Signed: Alphonsa Overall, M.D., [name] Surgery Office:  [phone]  01/15/2015, 11:23 AM

## 2015-01-15 NOTE — Progress Notes (Signed)
General Surgery Note  LOS: 2 days  POD -  2 Days Post-Op  Assessment/Plan: 1.  BILATERAL MASTECTOMY - 01/13/2015 - [place]  For CHEK2 genetic abnormality  Doing well - ready to go home Discharge instructions reviewed.  2.  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  Ready to go home. Objective:   Filed Vitals:   01/14/15 2150 01/15/15 0526  BP: 107/57 105/61  Pulse: 88 86  Temp: 97.6 F (36.4 C) 98.8 F (37.1 C)  Resp: 18 18     Intake/Output from previous day:  01/06 0701 - 01/07 0700 In: 1680 [P.O.:1680] Out: 2097 [Urine:1900; Drains:197]  Intake/Output this shift:  Total I/O In: 360 [P.O.:360] Out: 900 [Urine:800; Drains:100]   Physical Exam:   General: WN WF who is alert and oriented.    HEENT: Normal. Pupils equal. .   Lungs: Clear    Wound: Both mastectomy wounds look good.   Lab Results:    Recent Labs  01/14/15 0434 01/15/15 0531  WBC 14.4* 10.1  HGB 12.7 12.4  HCT 41.0 40.8  PLT 290 256    BMET   Recent Labs  01/14/15 0434 01/15/15 0531  NA 136 138  K 4.9 3.9  CL 99* 100*  CO2 31 31  GLUCOSE 255* 197*  BUN 14 21*  CREATININE 0.76 0.61  CALCIUM 9.1 8.9    PT/INR  No results for input(s): LABPROT, INR in the last 72 hours.  ABG  No results for input(s): PHART, HCO3 in the last 72 hours.  Invalid input(s): PCO2, PO2   Studies/Results:  No results found.   Anti-infectives:   Anti-infectives    Start     Dose/Rate Route Frequency Ordered Stop   01/14/15 0200  ceFAZolin (ANCEF) IVPB 2 g/50 mL premix     2 g 100 mL/hr over 30 Minutes Intravenous 3 times per day 01/13/15 2012 01/14/15 0127   01/13/15 1800  ceFAZolin (ANCEF) IVPB 2 g/50 mL premix  Status:  Discontinued     3 g 150 mL/hr over 30 Minutes Intravenous  Once 01/13/15  1709 01/13/15 1749   01/13/15 1748  ceFAZolin (ANCEF) 3 g in dextrose 5 % 50 mL IVPB  Status:  Discontinued     3 g 160 mL/hr over 30 Minutes Intravenous 30 min pre-op 01/13/15 1748 01/13/15 1959   01/13/15 0600  ceFAZolin (ANCEF) 3 g in dextrose 5 % 50 mL IVPB     3 g 160 mL/hr over 30 Minutes Intravenous 30 min pre-op 01/12/15 1328 01/13/15 1758      Alphonsa Overall, MD, FACS Pager: [place] [facility] Office: [phone] 01/15/2015

## 2015-01-15 NOTE — Progress Notes (Signed)
Nurse reviewed discharge instructions with pt and family.  Nurse instructed pt's daughter how to care for JP drains and how to empty and record JP drainage. Pt verbalized understanding of discharge instructions, follow up appointment and new medication.  Prescription given prior to discharge.  No concerns at time of discharge.

## 2015-01-17 NOTE — Progress Notes (Signed)
Quick Note:  Please let patient know that there was no cancer in there pathology. ______

## 2015-01-17 NOTE — Anesthesia Postprocedure Evaluation (Signed)
Anesthesia Post Note  Patient: Kelly Jacobson  Procedure(s) Performed: Procedure(s) (LRB): BILATERAL MASTECTOMY (Bilateral)  Patient location during evaluation: PACU Anesthesia Type: General and Regional Level of consciousness: awake Pain management: pain level controlled Vital Signs Assessment: post-procedure vital signs reviewed and stable Respiratory status: spontaneous breathing Cardiovascular status: stable Postop Assessment: no signs of nausea or vomiting Anesthetic complications: no    Last Vitals:  Filed Vitals:   01/14/15 2150 01/15/15 0526  BP: 107/57 105/61  Pulse: 88 86  Temp: 36.4 C 37.1 C  Resp: 18 18    Last Pain:  Filed Vitals:   01/15/15 1038  PainSc: 5                  Casin Federici

## 2015-04-18 ENCOUNTER — Encounter: Payer: Self-pay | Admitting: General Surgery

## 2015-06-07 ENCOUNTER — Encounter (HOSPITAL_COMMUNITY): Payer: BC Managed Care – PPO | Attending: Hematology & Oncology | Admitting: Hematology & Oncology

## 2015-06-07 ENCOUNTER — Encounter (HOSPITAL_COMMUNITY): Payer: Self-pay | Admitting: Hematology & Oncology

## 2015-06-07 VITALS — BP 115/57 | HR 85 | Temp 98.2°F | Resp 20 | Wt 273.0 lb

## 2015-06-07 DIAGNOSIS — Z853 Personal history of malignant neoplasm of breast: Secondary | ICD-10-CM

## 2015-06-07 DIAGNOSIS — C50911 Malignant neoplasm of unspecified site of right female breast: Secondary | ICD-10-CM

## 2015-06-07 DIAGNOSIS — IMO0002 Reserved for concepts with insufficient information to code with codable children: Secondary | ICD-10-CM

## 2015-06-07 DIAGNOSIS — Z1501 Genetic susceptibility to malignant neoplasm of breast: Secondary | ICD-10-CM

## 2015-06-07 NOTE — Progress Notes (Signed)
Kelly Redwood, MD [place] [address]  Oncology History   Stage II (T1c, N1), grade 2 of right breast presenting in upper axilla with mass (actually superior portion of right arm).  1.9 cm mass with 3/26 positive nodes with extracapsular extension.  Both mets in lymph nodes were greater than 2 mm.  Negative margins.  Initial biopsy on 01/25/2009 with definitive surgery and lymph node dissection on 02/23/2009 followed by re-excision on 03/16/2009 with all margins clear.  ER 52%, PR 36%, Ki-67 33% Her2 negative.  S/P EC in dose dense fashion x 4 cycles followed by weekly Taxol.  S/P radiation Finishing in December 2011.  Now on Arimidex starting on 12/22/2009.     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  ER 62%, PR 36%, Ki-67 33%, Her 2 negative   02/23/2009 Surgery Right breast lumpectomy with sentinel node and axillary dissection demonstrating a 0.3 cm DCIS involving deep margin, + LVI, 3/26 positive lymph nodes   03/16/2009 Surgery Re-excision of margins with negative disease   05/10/2009 - 06/23/2009 Chemotherapy Epirubicin, Cytoxan x 4 cycles   07/06/2009 - 09/21/2009 Chemotherapy Taxol x 12 cycles   10/17/2009 - 12/07/2009 Radiation Therapy    12/22/2009 - 06/22/2014 Chemotherapy Anastrozole   12/25/2013 Pathology Results Breast Cancer Index- 5.9% risk of distant recurrence for ER+, lymph node negative patients after 5 years giving her a low likelihood of benefit from extended endocine therapy.   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 Barry Dienes    CURRENT THERAPY: Observation  INTERVAL HISTORY: Kelly Jacobson 55 y.o. female returns for   regular  visit for followup of stage II right breast She is here alone today. She has completed 5 years of endocrine therapy. She has undergone bilateral mastectomies with Dr. Barry Dienes.  Kelly Jacobson is unaccompanied.  She was out of work longer than she expected after her mastectomies. Her strength is not nearly what it was. She has been told her strength will improve with time. States the right side healed much faster than the left. She notes however that overall she is doing well. She is pleased with her decision, it gives her peace of mind.   Her hot flashes have not improved. Describes her appetite as "too good". She tries to walk. She has not met with survivorship.  She is taking Vitamin D. She was told she did not need to take calcium by her PCP.   The patient is interested in following with [place] yearly rather than our current schedule of once every 6 months.  She denies change in appetite. She is up to date with colonoscopy.   Past Medical History  Diagnosis Date  . Diabetes mellitus   . Thyroid disease   . DM (diabetes mellitus) ([facility]) 11/20/2010  . Hot flashes secondary to Arimidex 11/20/2010  . Hypothyroidism 11/20/2010    Synthroid  . Edema     hands and feet at times, takes as a direutic  . Depression     wellburtin  . Arthritis     Hands and legs  . Anxiety     Xanax  . Varicose veins 2014  . Breast cancer ([place]) 2011    right IDC+DCIS; ER/PR+,  Her2-, ki67=33%; right lumpectomy  . Complication of anesthesia     SLOW TO WAKE UP  . 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     is allergic to tape and propoxyphene.  Ms.  Jacobson had no medications administered during this visit.  Past Surgical History  Procedure Laterality Date  . Breast reduction surgery  1991  . Carpal tunnel release  04/06/1998  . Abdominal hysterectomy  2001    vaginal for endometriosis  . Debridement tennis elbow  2003  . Cervical fusion  01/15/2007  . Axillary cyst  01/25/2009  . Axillary node dissection  02/23/2009 and 03/16/2009  . Bilateral oophorectomy  04/19/2009  . Portacath placement  05/02/2009  . Trigger thumb surgery  02/01/2010  . Rectocele surgery  03/07/10  . Port-a-cath removal  01/16/2011    Procedure: REMOVAL PORT-A-CATH;  Surgeon: Stark Klein, MD;  Location: [facility];  Service: General;  Laterality: N/A;  Removal port-a-cath.  . Carpal tunnel release  [id number]    lt hand  . Colonoscopy N/A 05/07/2013    Procedure: COLONOSCOPY;  Surgeon: Danie Binder, MD;  [facility];  Service: Endoscopy;  Laterality: N/A;  8:30 AM  . Vein surgery  2005 / 2006 / 2014    BOTH LEGS  . Rectocele repair  2012  . Total mastectomy Bilateral 01/13/2015    Procedure: BILATERAL MASTECTOMY;  Surgeon: Stark Klein, MD;  Location: [facility];  Service: General;  Laterality: Bilateral;    Denies any headaches, dizziness, double vision, fevers, chills, nausea, vomiting, diarrhea, constipation, chest pain, heart palpitations, shortness of breath, blood in stool, black tarry stool, urinary pain, urinary burning, urinary frequency, hematuria. Positive for night sweats.    Hot flashes    PHYSICAL EXAMINATION  ECOG PERFORMANCE STATUS: 0 - Asymptomatic  Filed Vitals:   06/07/15 1403  BP: 115/57  Pulse: 85  Temp: 98.2 F (36.8 C)  Resp: 20    GENERAL:alert, no distress, well nourished, well developed, comfortable, cooperative, obese and smiling SKIN: skin color, texture, turgor are normal, no rashes or significant lesions HEAD: Normocephalic, No masses, lesions, tenderness or abnormalities EYES: normal, PERRLA, EOMI, Conjunctiva are pink and  non-injected EARS: External ears normal OROPHARYNX: mucous membranes are moist  NECK: supple, trachea midline LYMPH:  No palpable adenopathy in the neck, supraclavicular area, or axilla. BREAST: Bilateral mastectomies, incision sites are well healed/healing. Incision site on left with minor eschar. LUNGS: clear to auscultation  HEART: regular rate & rhythm ABDOMEN:abdomen soft, obese and normal bowel sounds BACK: Back symmetric, no curvature. EXTREMITIES:less then 2 second capillary refill, no joint deformities, effusion, or inflammation, no skin discoloration, no cyanosis  Left leg has varicose veins. NEURO: alert & oriented x 3 with fluent speech, no focal motor/sensory deficits, gait normal   LABORATORY DATA: I have reviewed the data as listed. CBC    Component Value Date/Time   WBC 10.1 01/15/2015 0531   RBC 4.53 01/15/2015 0531   HGB 12.4 01/15/2015 0531   HCT 40.8 01/15/2015 0531   PLT 256 01/15/2015 0531   MCV 90.1 01/15/2015 0531   MCH 27.4 01/15/2015 0531   MCHC 30.4 01/15/2015 0531   RDW 13.9 01/15/2015 0531  LYMPHSABS 2.1 12/07/2014 1322   MONOABS 0.5 12/07/2014 1322   EOSABS 0.2 12/07/2014 1322   BASOSABS 0.0 12/07/2014 1322      Chemistry      Component Value Date/Time   NA 138 01/15/2015 0531   K 3.9 01/15/2015 0531   CL 100* 01/15/2015 0531   CO2 31 01/15/2015 0531   BUN 21* 01/15/2015 0531   CREATININE 0.61 01/15/2015 0531      Component Value Date/Time   CALCIUM 8.9 01/15/2015 0531   CALCIUM 9.3 12/22/2013 0957   ALKPHOS 95 12/07/2014 1322   AST 33 12/07/2014 1322   ALT 29 12/07/2014 1322   BILITOT 0.9 12/07/2014 1322       RADIOGRAPHIC STUDIES: I have personally reviewed the radiological images as listed and agreed with the findings in the report.  Study Result     CLINICAL DATA: History of right breast cancer. Patient is status post lumpectomy and reduction mammoplasty. She is planning on bilateral mastectomy.  EXAM: DIGITAL  DIAGNOSTIC BILATERAL MAMMOGRAM WITH CAD  COMPARISON: Previous exam(s).  ACR Breast Density Category b: There are scattered areas of fibroglandular density.  FINDINGS: There are no suspicious masses, areas of nonsurgical architectural distortion or microcalcifications in either breast. Bilateral postsurgical changes are stable.  Mammographic images were processed with CAD.  IMPRESSION: No mammographic evidence of malignancy in either breast.  RECOMMENDATION: Screening mammogram in one year, if the patient does not have a mastectomy.(Code:SM-B-01Y)  I have discussed the findings and recommendations with the patient. Results were also provided in writing at the conclusion of the visit. If applicable, a reminder letter will be sent to the patient regarding the next appointment.  BI-RADS CATEGORY 2: Benign Finding(s)   Electronically Signed  By: Fidela Salisbury M.D.  On: 12/28/2014 10:34       ASSESSMENT:  1. Stage II right breast cancer R breast 2. Obesity 3. Normal bone density on 12/17/2014. 4. Fatty infiltration of liver  5. CHEK 2 mutation 6. Bilateral mastectomies  Patient Active Problem List   Diagnosis Date Noted  . 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 Family history of colon cancer 09/02/2014  . Personal history of breast cancer 09/02/2014  . Family history of cancer 09/02/2014  . 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  . Genetic testing 08/30/2014  . Hypothyroidism 11/20/2010  . DM (diabetes mellitus) ([facility]) 11/20/2010  . Hot flashes secondary to Arimidex 11/20/2010  . 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   She has had a hysterectomy.  She is up to date with colonoscopy screening.  I have referred her to survivorship.  I have encouraged her to increase her physical activity. We have discussed the  benefits of exercise in the past.   I have written her a prescription for bra and prostheses.  She will return in 1 year for routine follow up.   All questions were answered. The patient knows to call the clinic with any problems, questions or concerns. We can certainly see the patient much sooner if necessary.  This document serves as a record of services personally performed by Ancil Linsey, MD. It was created on her behalf by Arlyce Harman, a trained medical scribe. The creation of this record is based on the scribe's personal observations and the provider's statements to them. This document has been checked and approved by the attending provider.  I have  reviewed the above documentation for accuracy and completeness, and I agree with the above.  This note was electronically signed. Molli Hazard, MD  06/07/2015

## 2015-06-07 NOTE — Patient Instructions (Addendum)
[facility] at [facility] Discharge Instructions  RECOMMENDATIONS MADE BY THE CONSULTANT AND ANY TEST RESULTS WILL BE SENT TO YOUR REFERRING PHYSICIAN.  Return to see Dr. Whitney Muse in 1 year and we want you to see Elzie Rings the Nurse Practitioner for survivorship soon.  Thank you for choosing [facility] at [facility] to provide your oncology and hematology care.  To afford each patient quality time with our provider, please arrive at least 15 minutes before your scheduled appointment time.   Beginning January 23rd 2017 lab work for the [facility] will be done in the  Main lab at [facility] on 1st floor. If you have a lab appointment with the [facility] please come in thru the  Main Entrance and check in at the main information desk  You need to re-schedule your appointment should you arrive 10 or more minutes late.  We strive to give you quality time with our providers, and arriving late affects you and other patients whose appointments are after yours.  Also, if you no show three or more times for appointments you may be dismissed from the clinic at the providers discretion.     Again, thank you for choosing [facility].  Our hope is that these requests will decrease the amount of time that you wait before being seen by our physicians.       _____________________________________________________________  Should you have questions after your visit to [facility], please contact our office at [phone] between the hours of 8:30 a.m. and 4:30 p.m.  Voicemails left after 4:30 p.m. will not be returned until the following business day.  For prescription refill requests, have your pharmacy contact our office.         Resources For Cancer Patients and their Caregivers ? American Cancer Society: Can assist with transportation, wigs, general needs, runs Look Good Feel Better.        (864)290-8069 ? Cancer  Care: Provides financial assistance, online support groups, medication/co-pay assistance.  1-800-813-HOPE (413)125-0344) ? [facility] Assists [place] cancer patients and their families through emotional , educational and financial support.  709-217-1533 ? [place] DSS Where to apply for food stamps, Medicaid and utility assistance. 817-330-1197 ? [facility]: Transportation to medical appointments. 517-091-4656 ? Social Security Administration: May apply for disability if have a Stage IV cancer. 7052218315 (269) 107-0802 ? [employer], Disability and Transit Services: Assists with nutrition, care and transit needs. [place] Support Programs: @10RELATIVEDAYS @ > Cancer Support Group  2nd Tuesday of the month 1pm-2pm, Journey Room  > Creative Journey  3rd Tuesday of the month 1130am-1pm, Journey Room  > Look Good Feel Better  1st Wednesday of the month 10am-12 noon, Journey Room (Call [facility] to register [phone])

## 2015-07-19 ENCOUNTER — Telehealth: Payer: Self-pay | Admitting: Adult Health

## 2015-07-19 ENCOUNTER — Ambulatory Visit (HOSPITAL_BASED_OUTPATIENT_CLINIC_OR_DEPARTMENT_OTHER): Payer: BC Managed Care – PPO | Admitting: Adult Health

## 2015-07-19 DIAGNOSIS — C50911 Malignant neoplasm of unspecified site of right female breast: Secondary | ICD-10-CM

## 2015-07-19 NOTE — Telephone Encounter (Signed)
Patient no showed for scheduled appt today. I was able to speak with patient via telephone and she shared that she was in the ED with her mother and thus was unable to meet with me today.  She asked that we reschedule for sometime in August or later.  I will ask Amy to contact the patient to reschedule her survivorship appointment.  I offered understanding and support for the patient and wished her well.  I look forward to participating in her care.   Mike Craze, NP [place] [phone]

## 2015-07-19 NOTE — Progress Notes (Signed)
ROS   Patient no showed for scheduled appt today. I was able to speak with patient via telephone and she shared that she was in the ED with her mother and thus was unable to meet with me today.  She asked that we reschedule for sometime in August or later.  I will ask Amy to contact the patient to reschedule her survivorship appointment.  I offered understanding and support for the patient and wished her well.  I look forward to participating in her care.   Mike Craze, NP [place] [phone]

## 2015-08-09 ENCOUNTER — Encounter (HOSPITAL_COMMUNITY): Payer: BC Managed Care – PPO | Attending: Hematology & Oncology | Admitting: Adult Health

## 2015-08-09 ENCOUNTER — Encounter (HOSPITAL_COMMUNITY): Payer: Self-pay | Admitting: Adult Health

## 2015-08-09 DIAGNOSIS — Z853 Personal history of malignant neoplasm of breast: Secondary | ICD-10-CM | POA: Diagnosis not present

## 2015-08-09 DIAGNOSIS — C50911 Malignant neoplasm of unspecified site of right female breast: Secondary | ICD-10-CM

## 2015-08-09 NOTE — Progress Notes (Signed)
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  1.9 cm mass with 3/26 positive nodes with extracapsular extension.  Both mets in lymph nodes were greater than 2 mm.  Negative margins.  Initial biopsy on 01/25/2009 with definitive surgery and lymph node dissection on 02/23/2009 followed by re-excision on 03/16/2009 with all margins clear.  ER 52%, PR 36%, Ki-67 33% Her2 negative.  S/P EC in dose dense fashion x 4 cycles followed by weekly Taxol.  S/P radiation Finishing in December 2011.  Now on Arimidex starting on 12/22/2009.     Infiltrating ductal carcinoma of breast ([place])   01/18/2009 Initial Diagnosis    Simple excision of right axilla demonstrating high grade DCIS and invasive moderately  differentiated ductal carcinoma with positive rection margins.  ER 62%, PR 36%, Ki-67 33%, Her 2 negative     02/23/2009 Surgery    Right breast lumpectomy with sentinel node and axillary dissection ([place]) demonstrating 1.0 cm invasive tumor, a 0.3 cm DCIS involving deep margin, + LVI, 3/26 positive lymph nodes     02/23/2009 Pathologic Stage    pT1c, pN1a: Stage IIA     03/16/2009 Surgery    Re-excision of margins with negative disease     04/06/2009 Echocardiogram    EF 55-60%     04/19/2009 Surgery    Bilateral salpingo-oopherectomy: Benign. (L) periadnexal soft tissue with endometriosis & endosalpingiosis. (L) benign papillary cyst adenofibroma. (R) paratubal soft tissue with endosalpingiosis & benign cystic follicles/glandular inclusions.      05/10/2009 - 06/23/2009 Chemotherapy    Epirubicin, Cytoxan x 4 cycles     07/06/2009 - 09/21/2009 Chemotherapy    Taxol x 12 cycles     10/17/2009 -  12/07/2009 Radiation Therapy    Adjuvant RT with Dr. Sondra Come at [facility]      12/22/2009 - 06/22/2014 Anti-estrogen oral therapy    Anastrazole x 5 years     12/25/2013 Pathology Results    Breast Cancer Index- 5.9% risk of distant recurrence for ER+, lymph node negative patients after 5 years giving her a low likelihood of benefit from extended endocine therapy.     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 Genes analyzed: APC, ATM, BARD1, BMPR1A, BRCA1, BRCA2, BRIP1, CDH1, CDK4, CDKN2A, CHEK2, MLH1, MRE11A, MSH2, MSH6, MUTYH, NBN, NF1, PALB2, PMS2, POLD1, POLE, PTEN, RAD50, RAD51C, RAD51D, SMAD4, SMARCA4A, STK11, & TP53.      12/27/2014 Imaging    DEXA scan: Normal. T-score -0.4     01/13/2015 Surgery    Bilateral mastectomies (Dr. Barry Dienes): Right breast-Benign breast tissue, 0/1 LNs.  Enlarged (L) axillary LN negative. 0/5 additional LNs negative.  No malignancy identified .       INTERVAL HISTORY:  Ms. Dibartolo is here for her first visit to survivorship as a long-term breast cancer survivor.  She initially completed treatment in 11/2009; then in 08/2014 she underwent genetic testing revealing a CHEK2 mutation.  She went on to have bilateral mastectomies without reconstruction in 01/2015 with Dr. Barry Dienes.   She has recovered well from her last surgery; she  does report that the (L) chest became infected and "took forever to heal."  She has since healed well without recurrent infection.  Her hot flashes are improved.  Her biggest concerns today are emotional.  Her elderly mother has been placed at [facility], as Ms. Pebley was no longer able to care for her.  Her mom has difficulty walking and is mostly wheelchair bound.  Ms. Moskal reports feeling overwhelmed with her mom's declining health and the financial and emotional burden this has caused her and her family.  She is not  sleeping well as a result.  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  This has been very stressful for her in recent months.  She is tearful at times and feels very overwhelmed.    ADDITIONAL REVIEW OF SYSTEMS:  Review of Systems  Constitutional: Negative for weight loss.  Gastrointestinal: Negative for constipation and diarrhea.  Neurological: Negative for dizziness.  Endo/Heme/Allergies:       (+) hot flashes, but improving.   Psychiatric/Behavioral: Positive for depression. The patient is nervous/anxious and has insomnia.    A 14-point review of systems completed and was negative except as noted above.   PAST MEDICAL & SURGICAL HISTORY:  Past Medical History:  Diagnosis Date  . Anxiety    Xanax  . Arthritis    Hands and legs  . Breast cancer ([place]) 2011   right IDC+DCIS; ER/PR+, Her2-, ki67=33%; right lumpectomy  . Complication of anesthesia    SLOW TO WAKE UP  . Depression    wellburtin  . Diabetes mellitus   . DM (diabetes mellitus) ([place]) 11/20/2010  . Edema    hands and feet at times, takes as a direutic  . Hot flashes secondary to Arimidex 11/20/2010  . Hyperlipidemia   . Hypothyroidism 11/20/2010   Synthroid  . Thyroid disease   . Varicose veins 2014   Past Surgical History:  Procedure Laterality Date  . ABDOMINAL HYSTERECTOMY  2001   vaginal for endometriosis  . axillary cyst  01/25/2009  . AXILLARY NODE DISSECTION  02/23/2009 and 03/16/2009  . BILATERAL OOPHORECTOMY  04/19/2009  . BREAST REDUCTION SURGERY  1991  . CARPAL TUNNEL RELEASE  04/06/1998  . CARPAL TUNNEL RELEASE  [id number]   lt hand  .  CERVICAL FUSION  01/15/2007  . COLONOSCOPY N/A 05/07/2013   Procedure: COLONOSCOPY;  Surgeon: Danie Binder, MD;  [facility];  Service: Endoscopy;  Laterality: N/A;  8:30 AM  . DEBRIDEMENT TENNIS ELBOW  2003  . PORT-A-CATH REMOVAL  01/16/2011   Procedure: REMOVAL PORT-A-CATH;  Surgeon: Stark Klein, MD;  Location: [facility];  Service: General;  Laterality: N/A;  Removal port-a-cath.  Sol Passer PLACEMENT  05/02/2009  . RECTOCELE REPAIR  2012  . rectocele surgery  03/07/10  . TOTAL MASTECTOMY Bilateral 01/13/2015   Procedure: BILATERAL MASTECTOMY;  Surgeon: Stark Klein, MD;  Location: [facility];  Service: General;  Laterality: Bilateral;  . trigger thumb surgery  02/01/2010  . VEIN SURGERY  2005 / 2006 / 2014   BOTH LEGS     SOCIAL HISTORY: Ms.  Estill is divorced and lives alone in [place], [name].  She previously was living with her mother and father before her father died about 6 years ago and now her mother's health is declining.  She has 2 brothers; only 1 of whom is able to help with the care of their mother.  She has 2 children, 1 daughter who is 30 years old and 1 son who turned 65 years old on 08/09/15.  She enjoys spending time with her children and friends.    CURRENT MEDICATIONS:  Current Outpatient Prescriptions on File Prior to Visit  Medication Sig Dispense Refill  . ALPRAZolam (XANAX) 0.25 MG tablet Take 0.25 mg by mouth every 8 (eight) hours as needed for anxiety.     Marland Kitchen aspirin 81 MG tablet Take 81 mg by mouth daily.    Marland Kitchen atorvastatin (LIPITOR) 20 MG tablet Take 20 mg by mouth daily.    Marland Kitchen buPROPion (WELLBUTRIN SR) 200 MG 12 hr tablet Take 200 mg by mouth daily.     . Canagliflozin (INVOKANA) 300 MG TABS Take 300 mg by mouth daily.     . Cholecalciferol 1000 units TBDP Take 1,000 Int'l Units by mouth daily.    . ferrous sulfate 325 (65 FE) MG tablet Take 325 mg by mouth daily with breakfast.    . HYDROcodone-acetaminophen (NORCO/VICODIN) 5-325 MG per tablet Take 1 tablet by mouth  every 6 (six) hours as needed. pain    . ibuprofen (ADVIL,MOTRIN) 800 MG tablet Take 800 mg by mouth every 8 (eight) hours as needed (pain). pain    . LANTUS SOLOSTAR 100 UNIT/ML Solostar Pen Inject 34 Units into the skin daily.     Marland Kitchen levothyroxine (SYNTHROID, LEVOTHROID) 50 MCG tablet Take 50 mcg by mouth daily.      . Liraglutide (VICTOZA) 18 MG/3ML SOLN Inject 1.8 mLs into the skin daily.     Marland Kitchen loratadine (CLARITIN) 10 MG tablet Take 10 mg by mouth daily.    . metFORMIN (GLUCOPHAGE) 1000 MG tablet Take 1,000 mg by mouth daily with breakfast.     . Multiple Vitamin (MULTIVITAMIN WITH MINERALS) TABS tablet Take 1 tablet by mouth daily.    . ondansetron (ZOFRAN) 4 MG tablet Take 4 mg by mouth every 6 (six) hours as needed for nausea or vomiting.    Marland Kitchen PROAIR HFA 108 (90 BASE) MCG/ACT inhaler Inhale 2 puffs into the lungs every 6 (six) hours as needed for wheezing or shortness of breath.     . Probiotic Product (ALIGN PO) Take 4 mg by mouth daily.    Marland Kitchen telmisartan (MICARDIS) 40 MG tablet Take 40 mg by mouth daily.    Marland Kitchen venlafaxine (EFFEXOR) 37.5 MG tablet Take 37.5 mg by mouth daily.    . vitamin E 1000 UNIT capsule Take 1,000 Units by mouth daily.    Marland Kitchen zolpidem (AMBIEN CR) 12.5 MG CR tablet Take 12.5 mg by mouth at bedtime as needed for sleep.      Current Facility-Administered Medications on File Prior to Visit  Medication Dose Route Frequency Provider Last Rate Last Dose  . sodium chloride 0.9 % injection 10 mL  10 mL Intravenous PRN [facility], PA-C   10 mL at 11/20/10 1326    ALLERGIES: Allergies  Allergen Reactions  . Tape Hives  . Propoxyphene Other (See Comments)    hallucination    PHYSICAL EXAM:  General: Well-appearing female in no acute distress. Unaccompanied today.  HEENT: Head is normocephalic.  Pupils equal and reactive to  light. Conjunctivae clear without exudate.  Sclerae anicteric. Oral mucosa is pink and moist without lesions. Oropharynx is pink and moist  without lesions. Lymph: No cervical, supraclavicular, infraclavicular, or axillary lymphadenopathy noted on palpation.   Cardiovascular: Normal rate and rhythm Respiratory: Clear to auscultation bilaterally. Chest expansion symmetric without accessory muscle use. Breathing non-labored.  Breast exam: Deferred; recently completed by Dr. Whitney Muse and pt with no new chest wall complaints.    GU: Deferred.   GI: Soft, non-tender abdomen. Normoactive bowel sounds.  Neuro: No focal deficits. Steady gait.   Psych: Normal mood and affect for situation; appropriately tearful at times during visit.  Extremities: No edema.  Skin: Warm and dry.   LABORATORY DATA: None for this visit.   DIAGNOSTIC IMAGING:  None for this visit.    ASSESSMENT & PLAN:  Ms. Chavous is a pleasant 55 y.o. female with history of Stage II right breast invasive ductal carcinoma, ER+/PR+/HER2-, initially treated with lumpectomy and adjuvant chemotherapy and radiation.  She also completed 5 years of Anastrazole from 12/2009-06/2014.  In 08/2014, she underwent genetic testing which showed CHEK2 mutation.  She subsequently underwent bilateral mastectomies on 01/13/15.  Patient presents to survivorship clinic today for long-term survivorship visit and routine cancer surveillance.    1. History of Stage II right breast cancer: Clinically, she is without evidence of disease based on recent visit with Dr. Whitney Muse.  She has no new symptoms concerning for recurrence. Ms. Burgueno is welcome to regturn to the survivorship clinic as needed.  She will see Dr. Whitney Muse in 06/2016 with history and physical exam.  No mammogram since bilateral mastectomies.   2. Adjustment disorder with depressed & anxious mood:  Ms. Jillson is currently undergoing several stressors in her life, with her greatest concern being the care of her elderly mother's declining health.  She is also struggling financially as a result.  We spent quite a bit of time today allowing  her to process and vent her current concerns.  I provided emotional support today through active listening, validation of her concerns, and expressive supportive counseling.  I also encouraged her to consider attending our support group and other activities here at the cancer center. While her cancer does not seem to be providing her much stress, it may be helpful for her to have an additional group of survivors and staff give her additional support.  We discussed that non-pharmacologic interventions, like exercise, would be helpful in alleviating some of her stress; this may also help with her insomnia.  I did have Loren Racer, LCSW briefly see the patient today to provide additional support and ideas for resources to help the patient in the care of her mother.  Please see [facility] documentation for additional information.    3. Insomnia:   I discussed with Ms. Benko the opportunity of potentially increasing her Ambien CR dose; her PCP has provided these prescriptions for her in the past.  Rather than write her a prescription for this today, I have placed a call to Dr. Raul Del nurse at [facility] to further discuss Ms. Whiteford's reported trouble sleeping.  I think it would be reasonable to double her dose of Ambien CR to 25 mg QHS; I will share those recommendations with Dr. Raul Del team when I receive a return phone call.  Ms. Hibbitts understands that she should not self-titrate her medications. I explained the rationale for this, including her safety, as well as Ambien CR being a controlled substance and she should always  take her medications exactly as they have been prescribed.  I encouraged her to engage in physical activity as well to try to see if this may help some with her insomnia as well.  I'm hopeful that if we can improve her sleep, then she may be better equipped to handle the emotional situations she is currently undergoing.     4. Physical activity/Healthy eating: Getting  adequate physical activity and maintaining a healthy diet as a cancer survivor is important for overall wellness and reduces the risk of cancer recurrence.  We also reviewed "The Nutrition Rainbow" handout, as well as the American Cancer Society's booklet with recommendations for nutrition and physical activity.      Dispo:  -Return to cancer center to see Dr. Whitney Muse for routine surveillance in 06/2016.  -No mammogram needed since bilateral mastectomies.  -Patient is welcome to return to survivorship clinic anytime in the future.    A total of 45 minutes was spent in face-to-face care of this patient, with greater than 50% of that time spent in counseling and care coordination.   Mike Craze, NP Survivorship Program [place] [phone]

## 2015-08-15 ENCOUNTER — Telehealth: Payer: Self-pay | Admitting: Adult Health

## 2015-08-15 NOTE — Telephone Encounter (Signed)
I called and spoke with Kelly Jacobson to follow-up on our recent visit and to see if anyone from her PCP's office had called her regarding increasing the dose of her Ambien to help with her sleep.  She tells me that Dr. Raul Del office did call her last week and told her to start taking 2 tablets of the Ambien, which Kelly Jacobson has done.  She says that she thinks it is helping her "a little bit."  She recently had a stomach bug, so it not feeling well today, but overall she thinks the 2 Ambien may be helping her.  "I'm just trying to take it one day at a time."    She voiced appreciation for my calling. I encouraged her to call me anytime with other questions or concerns.   Mike Craze, NP [place] [phone]

## 2015-08-15 NOTE — Telephone Encounter (Signed)
Encounter opened in error

## 2015-10-21 ENCOUNTER — Other Ambulatory Visit: Payer: Self-pay | Admitting: General Surgery

## 2015-10-21 DIAGNOSIS — C50919 Malignant neoplasm of unspecified site of unspecified female breast: Secondary | ICD-10-CM

## 2015-10-21 DIAGNOSIS — Z1502 Genetic susceptibility to malignant neoplasm of ovary: Principal | ICD-10-CM

## 2015-11-01 ENCOUNTER — Telehealth (HOSPITAL_COMMUNITY): Payer: Self-pay | Admitting: *Deleted

## 2015-11-01 NOTE — Telephone Encounter (Signed)
Pt contacted and notified to discuss changes to her upcoming MRI with the ordering physician (Dr. Barry Dienes).

## 2015-11-02 ENCOUNTER — Other Ambulatory Visit (HOSPITAL_COMMUNITY): Payer: BC Managed Care – PPO

## 2015-11-02 ENCOUNTER — Ambulatory Visit (HOSPITAL_COMMUNITY): Admission: RE | Admit: 2015-11-02 | Payer: BC Managed Care – PPO | Source: Ambulatory Visit

## 2015-11-09 ENCOUNTER — Ambulatory Visit (HOSPITAL_COMMUNITY): Payer: BC Managed Care – PPO | Attending: General Surgery | Admitting: Physical Therapy

## 2015-11-09 DIAGNOSIS — M25612 Stiffness of left shoulder, not elsewhere classified: Secondary | ICD-10-CM | POA: Diagnosis present

## 2015-11-09 DIAGNOSIS — M25611 Stiffness of right shoulder, not elsewhere classified: Secondary | ICD-10-CM | POA: Diagnosis present

## 2015-11-09 NOTE — Therapy (Signed)
Kelly Jacobson, [name], [address] Phone: [phone]   Fax:  [phone]  Physical Therapy Evaluation  Patient Details  Name: Kelly Jacobson MRN: [id number] Date of Birth: [birth year] Referring Provider: Stark Klein  Encounter Date: 11/09/2015      PT End of Session - 11/09/15 1149    Visit Number 1   Number of Visits 18   Date for PT Re-Evaluation 12/09/15   Authorization Type [employer]   Authorization - Visit Number 1   Authorization - Number of Visits 10   PT Start Time 1120   PT Stop Time 1200   PT Time Calculation (min) 40 min   Activity Tolerance Patient tolerated treatment well   Behavior During Therapy [facility] for tasks assessed/performed      Past Medical History:  Diagnosis Date  . Anxiety    Xanax  . Arthritis    Hands and legs  . Breast cancer ([place]) 2011   right IDC+DCIS; ER/PR+, Her2-, ki67=33%; right lumpectomy  . Complication of anesthesia    SLOW TO WAKE UP  . Depression    wellburtin  . Diabetes mellitus   . DM (diabetes mellitus) ([place]) 11/20/2010  . Edema    hands and feet at times, takes as a direutic  . Hot flashes secondary to Arimidex 11/20/2010  . Hyperlipidemia   . Hypothyroidism 11/20/2010   Synthroid  . Thyroid disease   . Varicose veins 2014    Past Surgical History:  Procedure Laterality Date  . ABDOMINAL HYSTERECTOMY  2001   vaginal for endometriosis  . axillary cyst  01/25/2009  . AXILLARY NODE DISSECTION  02/23/2009 and 03/16/2009  . BILATERAL OOPHORECTOMY  04/19/2009  . BREAST REDUCTION SURGERY  1991  . CARPAL TUNNEL RELEASE  04/06/1998  . CARPAL TUNNEL RELEASE  [id number]   lt hand  . CERVICAL FUSION  01/15/2007  . COLONOSCOPY N/A 05/07/2013   Procedure: COLONOSCOPY;  Surgeon: Danie Binder, MD;  [facility];  Service: Endoscopy;  Laterality: N/A;  8:30 AM  . DEBRIDEMENT TENNIS ELBOW  2003  . PORT-A-CATH REMOVAL  01/16/2011   Procedure: REMOVAL PORT-A-CATH;   Surgeon: Stark Klein, MD;  Location: [place];  Service: General;  Laterality: N/A;  Removal port-a-cath.  Sol Passer PLACEMENT  05/02/2009  . RECTOCELE REPAIR  2012  . rectocele surgery  03/07/10  . TOTAL MASTECTOMY Bilateral 01/13/2015   Procedure: BILATERAL MASTECTOMY;  Surgeon: Stark Klein, MD;  [facility];  Service: General;  Laterality: Bilateral;  . trigger thumb surgery  02/01/2010  . VEIN SURGERY  2005 / 2006 / 2014   BOTH LEGS    There were no vitals filed for this visit.       Subjective Assessment - 11/09/15 1123    Subjective Ms Jacobson states that she had bilateral masectomy on 1/5/20017 and now has scaring.  She states that it is very tight and scars under her arm.  She is able to lift her arms up but it is tight and she will need to take breaks after lifting, mopping or vacuuming.     Pertinent History Pt developed infection and necrotic eschar on the Lt incision with small dehiscence on the right.     Patient Stated Goals To be able to mop floors easier      Currently in Pain? No/denies            [facility] PT Assessment - 11/09/15 0001  Assessment   Medical Diagnosis scar contrature   Referring Provider Stark Klein   Onset Date/Surgical Date 01/13/15   Next MD Visit 2018   Prior Therapy none     Precautions   Precautions None     Restrictions   Weight Bearing Restrictions No     Balance Screen   Has the patient fallen in the past 6 months No   Has the patient had a decrease in activity level because of a fear of falling?  No   Is the patient reluctant to leave their home because of a fear of falling?  No     Prior Function   Vocation Full time employment   Vocation Requirements custodian    Leisure bake      Observation/Other Assessments   Observations significant adhesive scarring B incisions      ROM / Strength   AROM / PROM / Strength AROM     AROM   AROM Assessment Site Shoulder   Right/Left Shoulder Right;Left   Right Shoulder  Flexion 150 Degrees   Right Shoulder ABduction 120 Degrees   Right Shoulder Internal Rotation 55 Degrees   Right Shoulder External Rotation 65 Degrees   Left Shoulder Flexion 160 Degrees   Left Shoulder ABduction 115 Degrees   Left Shoulder Internal Rotation 90 Degrees   Left Shoulder External Rotation 65 Degrees                   [facility] Adult PT Treatment/Exercise - 11/09/15 0001      Exercises   Exercises Shoulder     Shoulder Exercises: Supine   External Rotation Both;10 reps   Internal Rotation Both;10 reps;Weights   Internal Rotation Weight (lbs) 2   Flexion Both;10 reps;Other (comment)  wand   Shoulder Flexion Weight (lbs) 2   ABduction Both;10 reps  wand                PT Education - 11/09/15 1149    Education provided Yes   Education Details HEP   Person(s) Educated Patient   Methods Explanation;Handout;Tactile cues;Verbal cues   Comprehension Verbalized understanding;Returned demonstration          PT Short Term Goals - 11/09/15 1208      PT SHORT TERM GOAL #1   Title Pt flexion to improve Bilaterally to 170 degrees to allow pt to be able to place items into higher cabinets without difficulty    Time 3   Period Weeks   Status New     PT SHORT TERM GOAL #2   Title Pt abduction to be increased to 150 degrees bilaterally to allow pt to mop for up to 30 minutes without having to rest    Time 3   Period Weeks   Status New     PT SHORT TERM GOAL #3   Title Pt to be independent in home exercise to allow goals to be met.     Time 3   Period Weeks           PT Long Term Goals - 11/09/15 1211      PT LONG TERM GOAL #1   Title Pt abduction to be at least 160 degrees bilaterally to allow pt to complete work activity without having to take rest breaks    Time 6   Period Weeks   Status New     PT LONG TERM GOAL #2   Title Pt to be independent in self scar massage for self  care at discharge    Time 6   Period Weeks   Status New      PT LONG TERM GOAL #3   Title Pt strength to be at least a 4+/5 in bilateral shoulders to allow pt to complete a full day at work without fatigue    Time 6   Period Weeks   Status New               Plan - 11/09/15 1203    Clinical Impression Statement Kelly Jacobson is a 55 yo female who had Bilateral masectomies on 01/13/2015 with delayed healing.  She is being referred to skilled physical therapy at this time for scar contractures bilaterally.  Examination demonstrates decreased activity tolerance , decreased ROM, decreased scar mobility and  increased fascial restrictions .  She will benefit from skilled physical therapy to address these issues and maximize her functional ability.    Rehab Potential Good   PT Frequency 3x / week   PT Duration 6 weeks   PT Treatment/Interventions ADLs/Self Care Home Management;Therapeutic activities;Therapeutic exercise;Patient/family education;Manual techniques;Scar mobilization;Passive range of motion;Other (comment)  laser    PT Next Visit Plan Begin PROM, pulley exercises, table stretches for flexion and abduction as well as scar mobilization.  May use laser as needed to decrease scarring.    PT Home Exercise Plan given for wand exercises    Consulted and Agree with Plan of Care Patient      Patient will benefit from skilled therapeutic intervention in order to improve the following deficits and impairments:  Decreased activity tolerance, Decreased range of motion, Decreased scar mobility, Decreased strength, Increased fascial restricitons, Impaired flexibility  Visit Diagnosis: Stiffness of right shoulder, not elsewhere classified - Plan: PT plan of care cert/re-cert  Stiffness of left shoulder, not elsewhere classified - Plan: PT plan of care cert/re-cert     Problem List Patient Active Problem List   Diagnosis Date Noted  . 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 Family history of  colon cancer 09/02/2014  . Personal history of breast cancer 09/02/2014  . Family history of cancer 09/02/2014  . 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  . Genetic testing 08/30/2014  . Hypothyroidism 11/20/2010  . DM (diabetes mellitus) ([place]) 11/20/2010  . Hot flashes secondary to Arimidex 11/20/2010  . Infiltrating ductal carcinoma of breast [facility]) 09/06/2010    Rayetta Humphrey, PT CLT [phone] 11/09/2015, 12:19 PM  [facility] [address] [place], [name], [address] Phone: [phone]   Fax:  [phone]  Name: EVAH RASHID MRN: [id number] Date of Birth: 05/20/1960

## 2015-11-09 NOTE — Patient Instructions (Addendum)
ROM: Flexion - Wand (Supine)    Lie on back holding wand. Raise arms over head.  Repeat __10__ times per set. Do __1__ sets per session. Do _2___ sessions per day.  http://orth.exer.us/928   Copyright  VHI. All rights reserved.  ROM: Abduction - Wand    Holding wand with left hand palm up, push wand directly out to side, leading with other hand palm down, until stretch is felt. Hold _5___ seconds. Repeat __10__ times per set. Do _1___ sets per session. Do _2___ sessions per day.  http://orth.exer.us/746   Copyright  VHI. All rights reserved.  ROM: External / Internal Rotation - in Abduction (Standing)    With upper slightly lower than your elbows and elbows bent at right angles, gently rotate arms back and forth  as far as possible without pain. May use a hammer to pull your arm back further  Repeat __10__ times per set. Do ___1_ sets per session. Do _2___ sessions per day.  http://orth.exer.us/912   Copyright  VHI. All rights reserved.

## 2015-11-16 ENCOUNTER — Ambulatory Visit (HOSPITAL_COMMUNITY): Payer: BC Managed Care – PPO

## 2015-11-16 DIAGNOSIS — M25612 Stiffness of left shoulder, not elsewhere classified: Secondary | ICD-10-CM

## 2015-11-16 DIAGNOSIS — M25611 Stiffness of right shoulder, not elsewhere classified: Secondary | ICD-10-CM | POA: Diagnosis not present

## 2015-11-16 NOTE — Therapy (Signed)
[place], [name], [address] Phone: [phone]   Fax:  [phone]  Physical Therapy Treatment  Patient Details  Name: Kelly Jacobson MRN: [id number] Date of Birth: 11/25/1960 Referring Provider: Stark Klein  Encounter Date: 11/16/2015      PT End of Session - 11/16/15 1140    Visit Number 2   Number of Visits 18   Date for PT Re-Evaluation 12/09/15   Authorization Type BCBS   Authorization - Visit Number 2   Authorization - Number of Visits 10   PT Start Time 1033   PT Stop Time 1131   PT Time Calculation (min) 58 min   Activity Tolerance Patient tolerated treatment well;Patient limited by fatigue;Patient limited by pain   Behavior During Therapy [facility] for tasks assessed/performed      Past Medical History:  Diagnosis Date  . Anxiety    Xanax  . Arthritis    Hands and legs  . 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 Complication of anesthesia    SLOW TO WAKE UP  . Depression    wellburtin  . Diabetes mellitus   . DM (diabetes mellitus) ([place]) 11/20/2010  . Edema    hands and feet at times, takes as a direutic  . Hot flashes secondary to Arimidex 11/20/2010  . Hyperlipidemia   . Hypothyroidism 11/20/2010   Synthroid  . Thyroid disease   . Varicose veins 2014    Past Surgical History:  Procedure Laterality Date  . ABDOMINAL HYSTERECTOMY  2001   vaginal for endometriosis  . axillary cyst  01/25/2009  . AXILLARY NODE DISSECTION  02/23/2009 and 03/16/2009  . BILATERAL OOPHORECTOMY  04/19/2009  . BREAST REDUCTION SURGERY  1991  . CARPAL TUNNEL RELEASE  04/06/1998  . CARPAL TUNNEL RELEASE  [id number]   lt hand  . CERVICAL FUSION  01/15/2007  . COLONOSCOPY N/A 05/07/2013   Procedure: COLONOSCOPY;  Surgeon: Danie Binder, MD;  [facility];  Service: Endoscopy;  Laterality: N/A;  8:30 AM  . DEBRIDEMENT TENNIS ELBOW  2003  . PORT-A-CATH REMOVAL   01/16/2011   Procedure: REMOVAL PORT-A-CATH;  Surgeon: Stark Klein, MD;  Location: [place];  Service: General;  Laterality: N/A;  Removal port-a-cath.  Sol Passer PLACEMENT  05/02/2009  . RECTOCELE REPAIR  2012  . rectocele surgery  03/07/10  . TOTAL MASTECTOMY Bilateral 01/13/2015   Procedure: BILATERAL MASTECTOMY;  Surgeon: Stark Klein, MD;  [facility];  Service: General;  Laterality: Bilateral;  . trigger thumb surgery  02/01/2010  . VEIN SURGERY  2005 / 2006 / 2014   BOTH LEGS    There were no vitals filed for this visit.      Subjective Assessment - 11/16/15 1039    Subjective Pt reports she is doing just fair today. She was not able to perform HEP due to it causing some new spastic low back pain.    Pertinent History Pt developed infection and necrotic eschar on the Lt incision with small dehiscence on the right.     Currently in Pain? No/denies            [facility] PT Assessment - 11/16/15 0001      ROM / Strength   AROM / PROM / Strength Strength     Strength   Strength Assessment Site Shoulder;Elbow   Right/Left Shoulder Right;Left   Right Shoulder Flexion 5/5  shrug 5/5;  Right Shoulder Extension 4/5   Right Shoulder ABduction 5/5   Right Shoulder Internal Rotation 3+/5  pain and weakness   Right Shoulder External Rotation 4+/5   Right Shoulder Horizontal ABduction 3+/5  3+/5 for both deltoid and middle trap   Right Shoulder Horizontal ADduction 5/5   Left Shoulder Flexion 4+/5  shrug 5/5;   Left Shoulder Extension 5/5   Left Shoulder ABduction 5/5   Left Shoulder Internal Rotation 4+/5   Left Shoulder External Rotation 5/5   Left Shoulder Horizontal ABduction 5/5  5/5 posterior deltoid; 4/5 middle trap   Left Shoulder Horizontal ADduction 5/5   Right/Left Elbow Right;Left   Right Elbow Flexion 5/5   Right Elbow Extension 5/5   Left Elbow Flexion 5/5   Left Elbow Extension 5/5                     [facility] Adult PT Treatment/Exercise -  11/16/15 0001      Shoulder Exercises: Supine   External Rotation 10 reps;Right;Strengthening;Weights   External Rotation Weight (lbs) 2lb dumbbell   1x10   Internal Rotation 10 reps;Weights;Right   Internal Rotation Weight (lbs) 2lb dumbbell    Flexion AAROM;Both;Other (comment)  20x1secH with wand   ABduction Both;10 reps  wand   Other Supine Exercises Towel roll stretch (pecs) (HEP addition)   3 minutes, arms @ 90 degrees abduction, supported at elvbow     Shoulder Exercises: Seated   Retraction Both;10 reps;Other (comment)  10x3sH scapular squeeze                PT Education - 11/16/15 1139    Education provided Yes   Education Details Explained importance of compliance with HEP; explained roll to t-spine and scapulothoracic joint in overhead mobility; discussed importance of low load long duration stretching.    Person(s) Educated Patient   Methods Explanation;Demonstration   Comprehension Verbalized understanding;Returned demonstration          PT Short Term Goals - 11/09/15 1208      PT SHORT TERM GOAL #1   Title Pt flexion to improve Bilaterally to 170 degrees to allow pt to be able to place items into higher cabinets without difficulty    Time 3   Period Weeks   Status New     PT SHORT TERM GOAL #2   Title Pt abduction to be increased to 150 degrees bilaterally to allow pt to mop for up to 30 minutes without having to rest    Time 3   Period Weeks   Status New     PT SHORT TERM GOAL #3   Title Pt to be independent in home exercise to allow goals to be met.     Time 3   Period Weeks           PT Long Term Goals - 11/09/15 1211      PT LONG TERM GOAL #1   Title Pt abduction to be at least 160 degrees bilaterally to allow pt to complete work activity without having to take rest breaks    Time 6   Period Weeks   Status New     PT LONG TERM GOAL #2   Title Pt to be independent in self scar massage for self care at discharge    Time 6    Period Weeks   Status New     PT LONG TERM GOAL #3   Title Pt strength to be at least a 4+/5  in bilateral shoulders to allow pt to complete a full day at work without fatigue    Time 6   Period Weeks   Status New               Plan - 11/16/15 1140    Clinical Impression Statement Evaluation findings reviewed; treatment goals reviewed; HEP is reviewed and modified to address aggravation of acute back pain from last session and home performance. Pt tolerates RLE elevated during supien exercises, but LLE elevated results in repeated muscle cramps in the lateral hamstrings and glutes: pt encouraged to find any position that is tolerated at home, no prior history of LBP, howevere does report chronic issues with hamstrings/calf cramps bilat. Towel roll stretch is added to HEP. Pt toleratiing R GHJ rotator strengthening with mild fatigue, but no pain. MMT ofBUE performed revealing most deficits on the R side in scapular retractors, depressors, posterior deltoid, GHJ internal rotators and GHJ external rotators.    Rehab Potential Good   PT Frequency 3x / week   PT Duration 6 weeks   PT Treatment/Interventions ADLs/Self Care Home Management;Therapeutic activities;Therapeutic exercise;Patient/family education;Manual techniques;Scar mobilization;Passive range of motion;Other (comment)   PT Next Visit Plan Repeat towel roll stretch, flexion table slides, Abduction table slides, Rt GHJ internal rotation/external rotation strengthening, pec minor release/stretch, standing shoulder extension c band, scapular retraction with hold.    PT Home Exercise Plan Eval: supine shoulder flexion AAROM c wand; supine shoulder scaption+ER AAROM c wand; seated shoulder GHJ rotation AROM (deferred); 11/8: towel roll stretch, seated scap retraction.    Consulted and Agree with Plan of Care Patient      Patient will benefit from skilled therapeutic intervention in order to improve the following deficits and  impairments:  Decreased activity tolerance, Decreased range of motion, Decreased scar mobility, Decreased strength, Increased fascial restricitons, Impaired flexibility  Visit Diagnosis: Stiffness of right shoulder, not elsewhere classified  Stiffness of left shoulder, not elsewhere classified     Problem List Patient Active Problem List   Diagnosis Date Noted  . 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 Family history of colon cancer 09/02/2014  . Personal history of breast cancer 09/02/2014  . Family history of cancer 09/02/2014  . 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  . Genetic testing 08/30/2014  . Hypothyroidism 11/20/2010  . DM (diabetes mellitus) ([place]) 11/20/2010  . Hot flashes secondary to Arimidex 11/20/2010  . 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 [address] [place], [name], [address] Phone: [phone]   Fax:  [phone]  Name: Kelly Jacobson MRN: [id number] Date of Birth: 25-Jan-1960

## 2015-11-18 ENCOUNTER — Ambulatory Visit (HOSPITAL_COMMUNITY): Payer: BC Managed Care – PPO | Admitting: Physical Therapy

## 2015-11-18 ENCOUNTER — Telehealth (HOSPITAL_COMMUNITY): Payer: Self-pay | Admitting: Internal Medicine

## 2015-11-18 NOTE — Telephone Encounter (Signed)
11/18/15 pt called to say that something has come up and she won't be able to keep the appt today

## 2015-11-20 ENCOUNTER — Ambulatory Visit
Admission: RE | Admit: 2015-11-20 | Discharge: 2015-11-20 | Disposition: A | Discharge status: Home / Self Care | Payer: BC Managed Care – PPO | Source: Ambulatory Visit | Attending: General Surgery | Admitting: General Surgery

## 2015-11-20 DIAGNOSIS — C50919 Malignant neoplasm of unspecified site of unspecified female breast: Secondary | ICD-10-CM

## 2015-11-20 DIAGNOSIS — Z1502 Genetic susceptibility to malignant neoplasm of ovary: Principal | ICD-10-CM

## 2015-11-20 MED ORDER — GADOBENATE DIMEGLUMINE 529 MG/ML IV SOLN
20.0000 mL | Freq: Once | INTRAVENOUS | Status: AC | PRN
  Administered 2015-11-20: 20 mL via INTRAVENOUS

## 2015-11-22 ENCOUNTER — Ambulatory Visit (HOSPITAL_COMMUNITY): Payer: BC Managed Care – PPO | Admitting: Physical Therapy

## 2015-11-22 DIAGNOSIS — M25611 Stiffness of right shoulder, not elsewhere classified: Secondary | ICD-10-CM | POA: Diagnosis not present

## 2015-11-22 DIAGNOSIS — M25612 Stiffness of left shoulder, not elsewhere classified: Secondary | ICD-10-CM

## 2015-11-22 NOTE — Therapy (Signed)
[place], [name], [address] Phone: [phone]   Fax:  [phone]  Physical Therapy Treatment  Patient Details  Name: Kelly Jacobson MRN: [id number] Date of Birth: 1960/05/06 Referring Provider: Stark Klein  Encounter Date: 11/22/2015      PT End of Session - 11/22/15 1218    Visit Number 3   Number of Visits 18   Date for PT Re-Evaluation 12/09/15   Authorization Type BCBS   Authorization - Visit Number 3   Authorization - Number of Visits 10   PT Start Time 1130   PT Stop Time 8299   PT Time Calculation (min) 35 min   Activity Tolerance Patient tolerated treatment well;Patient limited by fatigue;Patient limited by pain   Behavior During Therapy [facility] for tasks assessed/performed      Past Medical History:  Diagnosis Date  . Anxiety    Xanax  . Arthritis    Hands and legs  . 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 Complication of anesthesia    SLOW TO WAKE UP  . Depression    wellburtin  . Diabetes mellitus   . DM (diabetes mellitus) ([place]) 11/20/2010  . Edema    hands and feet at times, takes as a direutic  . Hot flashes secondary to Arimidex 11/20/2010  . Hyperlipidemia   . Hypothyroidism 11/20/2010   Synthroid  . Thyroid disease   . Varicose veins 2014    Past Surgical History:  Procedure Laterality Date  . ABDOMINAL HYSTERECTOMY  2001   vaginal for endometriosis  . axillary cyst  01/25/2009  . AXILLARY NODE DISSECTION  02/23/2009 and 03/16/2009  . BILATERAL OOPHORECTOMY  04/19/2009  . BREAST REDUCTION SURGERY  1991  . CARPAL TUNNEL RELEASE  04/06/1998  . CARPAL TUNNEL RELEASE  [id number]   lt hand  . CERVICAL FUSION  01/15/2007  . COLONOSCOPY N/A 05/07/2013   Procedure: COLONOSCOPY;  Surgeon: Danie Binder, MD;  [facility];  Service: Endoscopy;  Laterality: N/A;  8:30 AM  . DEBRIDEMENT TENNIS ELBOW  2003  . PORT-A-CATH  REMOVAL  01/16/2011   Procedure: REMOVAL PORT-A-CATH;  Surgeon: Stark Klein, MD;  Location: [place];  Service: General;  Laterality: N/A;  Removal port-a-cath.  Sol Passer PLACEMENT  05/02/2009  . RECTOCELE REPAIR  2012  . rectocele surgery  03/07/10  . TOTAL MASTECTOMY Bilateral 01/13/2015   Procedure: BILATERAL MASTECTOMY;  Surgeon: Stark Klein, MD;  [facility];  Service: General;  Laterality: Bilateral;  . trigger thumb surgery  02/01/2010  . VEIN SURGERY  2005 / 2006 / 2014   BOTH LEGS    There were no vitals filed for this visit.      Subjective Assessment - 11/22/15 1214    Subjective Pt states she is having no pain or issues.  States she is just tight.  compliance reported with HEP.   Currently in Pain? No/denies                         [facility] Adult PT Treatment/Exercise - 11/22/15 0001      Shoulder Exercises: Supine   Other Supine Exercises PROM for all directions     Shoulder Exercises: Standing   Other Standing Exercises doorway stretch for bilateral pecs 2X30"     Shoulder Exercises: Pulleys   Other Pulley Exercises no stretch obtained     Manual Therapy  Manual Therapy Myofascial release;Passive ROM   Manual therapy comments completed seperately from all other interventions   Myofascial Release to bilateral scar tissue in pecs and chest into anterior deltoid   Passive ROM all shoulder motions in supine                  PT Short Term Goals - 11/09/15 1208      PT SHORT TERM GOAL #1   Title Pt flexion to improve Bilaterally to 170 degrees to allow pt to be able to place items into higher cabinets without difficulty    Time 3   Period Weeks   Status New     PT SHORT TERM GOAL #2   Title Pt abduction to be increased to 150 degrees bilaterally to allow pt to mop for up to 30 minutes without having to rest    Time 3   Period Weeks   Status New     PT SHORT TERM GOAL #3   Title Pt to be independent in home exercise to allow goals  to be met.     Time 3   Period Weeks           PT Long Term Goals - 11/09/15 1211      PT LONG TERM GOAL #1   Title Pt abduction to be at least 160 degrees bilaterally to allow pt to complete work activity without having to take rest breaks    Time 6   Period Weeks   Status New     PT LONG TERM GOAL #2   Title Pt to be independent in self scar massage for self care at discharge    Time 6   Period Weeks   Status New     PT LONG TERM GOAL #3   Title Pt strength to be at least a 4+/5 in bilateral shoulders to allow pt to complete a full day at work without fatigue    Time 6   Period Weeks   Status New               Plan - 11/22/15 1218    Clinical Impression Statement Pt late due to line at check in desk.  Pt reports frustration with therex as they are really "not doing anything for me".  Attempted pullies, however no stretch obtained.  Most limitations appear to be in very end range of motion and is greatly restricted by scarring and fascial restrictions due to multiple surgeries.  Began myofascial and scar techniques to these areas with great results.  Pt very happy with results and able to achieve that end range following.  Instructed to complete self massage at home as well.   Rehab Potential Good   PT Frequency 3x / week   PT Duration 6 weeks   PT Treatment/Interventions ADLs/Self Care Home Management;Therapeutic activities;Therapeutic exercise;Patient/family education;Manual techniques;Scar mobilization;Passive range of motion;Other (comment)   PT Next Visit Plan continue to progress with large amount spent on manual techniques to release scar tissue.   PT Home Exercise Plan Eval: supine shoulder flexion AAROM c wand; supine shoulder scaption+ER AAROM c wand; seated shoulder GHJ rotation AROM (deferred); 11/8: towel roll stretch, seated scap retraction.    Consulted and Agree with Plan of Care Patient      Patient will benefit from skilled therapeutic intervention  in order to improve the following deficits and impairments:  Decreased activity tolerance, Decreased range of motion, Decreased scar mobility, Decreased strength, Increased fascial restricitons, Impaired  flexibility  Visit Diagnosis: Stiffness of right shoulder, not elsewhere classified  Stiffness of left shoulder, not elsewhere classified     Problem List Patient Active Problem List   Diagnosis Date Noted  . 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 Family history of colon cancer 09/02/2014  . Personal history of breast cancer 09/02/2014  . Family history of cancer 09/02/2014  . 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  . Genetic testing 08/30/2014  . Hypothyroidism 11/20/2010  . DM (diabetes mellitus) ([place]) 11/20/2010  . Hot flashes secondary to Arimidex 11/20/2010  . 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 [place], [name], [address] Phone: [phone]   Fax:  [phone]  Name: DAVIA SMYRE MRN: [id number] Date of Birth: 1960-09-13

## 2015-11-23 ENCOUNTER — Ambulatory Visit (HOSPITAL_COMMUNITY): Payer: BC Managed Care – PPO | Admitting: Physical Therapy

## 2015-11-23 DIAGNOSIS — M25611 Stiffness of right shoulder, not elsewhere classified: Secondary | ICD-10-CM | POA: Diagnosis not present

## 2015-11-23 DIAGNOSIS — M25612 Stiffness of left shoulder, not elsewhere classified: Secondary | ICD-10-CM

## 2015-11-23 NOTE — Therapy (Signed)
[place], [name], [address] Phone: [phone]   Fax:  [phone]  Physical Therapy Treatment  Patient Details  Name: Kelly Jacobson MRN: [id number] Date of Birth: [birth year] Referring Provider: Stark Klein  Encounter Date: 11/23/2015      PT End of Session - 11/23/15 1158    Visit Number 4   Number of Visits 18   Date for PT Re-Evaluation 12/09/15   Authorization Type BCBS   Authorization - Visit Number 4   Authorization - Number of Visits 10   PT Start Time 1130   PT Stop Time 1150   PT Time Calculation (min) 20 min   Activity Tolerance Patient tolerated treatment well;Patient limited by fatigue;Patient limited by pain   Behavior During Therapy [facility] for tasks assessed/performed      Past Medical History:  Diagnosis Date  . Anxiety    Xanax  . Arthritis    Hands and legs  . 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 Complication of anesthesia    SLOW TO WAKE UP  . Depression    wellb[place]in  . Diabetes mellitus   . DM (diabetes mellitus) ([place]) 11/20/2010  . Edema    hands and feet at times, takes as a direutic  . Hot flashes secondary to Arimidex 11/20/2010  . Hyperlipidemia   . Hypothyroidism 11/20/2010   Synthroid  . Thyroid disease   . Varicose veins 2014    Past Surgical History:  Procedure Laterality Date  . ABDOMINAL HYSTERECTOMY  2001   vaginal for endometriosis  . axillary cyst  01/25/2009  . AXILLARY NODE DISSECTION  02/23/2009 and 03/16/2009  . BILATERAL OOPHORECTOMY  04/19/2009  . BREAST REDUCTION SURGERY  1991  . CARPAL TUNNEL RELEASE  04/06/1998  . CARPAL TUNNEL RELEASE  [id number]   lt hand  . CERVICAL FUSION  01/15/2007  . COLONOSCOPY N/A 05/07/2013   Procedure: COLONOSCOPY;  Surgeon: Danie Binder, MD;  [facility];  Service: Endoscopy;  Laterality: N/A;  8:30 AM  . DEBRIDEMENT TENNIS ELBOW  2003  . PORT-A-CATH  REMOVAL  01/16/2011   Procedure: REMOVAL PORT-A-CATH;  Surgeon: Stark Klein, MD;  Location: [place];  Service: General;  Laterality: N/A;  Removal port-a-cath.  Sol Passer PLACEMENT  05/02/2009  . RECTOCELE REPAIR  2012  . rectocele surgery  03/07/10  . TOTAL MASTECTOMY Bilateral 01/13/2015   Procedure: BILATERAL MASTECTOMY;  Surgeon: Stark Klein, MD;  [facility];  Service: General;  Laterality: Bilateral;  . trigger thumb surgery  02/01/2010  . VEIN SURGERY  2005 / 2006 / 2014   BOTH LEGS    There were no vitals filed for this visit.      Subjective Assessment - 11/23/15 1155    Subjective Pt reports significant improvement since yesterday's session.  States she can now get her Rt UE back all the way.  No pain.   Currently in Pain? No/denies                         [facility] Adult PT Treatment/Exercise - 11/23/15 0001      Manual Therapy   Manual Therapy Myofascial release;Passive ROM   Manual therapy comments completed seperately from all other interventions   Myofascial Release to bilateral scar tissue in pecs and chest into anterior deltoid   Passive ROM all shoulder motions in supine  PT Education - 11/23/15 1201    Education provided Yes   Education Details Educated daughter on scar massage   Person(s) Educated Patient   Methods Explanation;Demonstration;Verbal cues   Comprehension Verbalized understanding;Returned demonstration          PT Short Term Goals - 11/09/15 1208      PT SHORT TERM GOAL #1   Title Pt flexion to improve Bilaterally to 170 degrees to allow pt to be able to place items into higher cabinets without difficulty    Time 3   Period Weeks   Status New     PT SHORT TERM GOAL #2   Title Pt abduction to be increased to 150 degrees bilaterally to allow pt to mop for up to 30 minutes without having to rest    Time 3   Period Weeks   Status New     PT SHORT TERM GOAL #3   Title Pt to be independent in home  exercise to allow goals to be met.     Time 3   Period Weeks           PT Long Term Goals - 11/09/15 1211      PT LONG TERM GOAL #1   Title Pt abduction to be at least 160 degrees bilaterally to allow pt to complete work activity without having to take rest breaks    Time 6   Period Weeks   Status New     PT LONG TERM GOAL #2   Title Pt to be independent in self scar massage for self care at discharge    Time 6   Period Weeks   Status New     PT LONG TERM GOAL #3   Title Pt strength to be at least a 4+/5 in bilateral shoulders to allow pt to complete a full day at work without fatigue    Time 6   Period Weeks   Status New               Plan - 11/23/15 1158    Clinical Impression Statement Pt arrived late.  daughter with her today to be educated in scar massage.  Educated and instructed in proper technique.  Daughter able to demonstrate correctly.  Noted improvement in scar tissue this session as compared to yesterday, especially on Rt.  Lt with most adhesions and scar present.  continues to complete HEP and stretches at home.   Rehab Potential Good   PT Frequency 3x / week   PT Duration 6 weeks   PT Treatment/Interventions ADLs/Self Care Home Management;Therapeutic activities;Therapeutic exercise;Patient/family education;Manual techniques;Scar mobilization;Passive range of motion;Other (comment)   PT Next Visit Plan continue to progress with large amount spent on manual techniques to release scar tissue.     PT Home Exercise Plan Eval: supine shoulder flexion AAROM c wand; supine shoulder scaption+ER AAROM c wand; seated shoulder GHJ rotation AROM (deferred); 11/8: towel roll stretch, seated scap retraction.    Consulted and Agree with Plan of Care Patient      Patient will benefit from skilled therapeutic intervention in order to improve the following deficits and impairments:  Decreased activity tolerance, Decreased range of motion, Decreased scar mobility,  Decreased strength, Increased fascial restricitons, Impaired flexibility  Visit Diagnosis: Stiffness of right shoulder, not elsewhere classified  Stiffness of left shoulder, not elsewhere classified     Problem List Patient Active Problem List   Diagnosis Date Noted  . Genetic predisposition to breast cancer s/p bilateral  mastectomies 01/13/2015 01/13/2015  . Family history of colon cancer 09/02/2014  . Personal history of breast cancer 09/02/2014  . Family history of cancer 09/02/2014  . 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  . Genetic testing 08/30/2014  . Hypothyroidism 11/20/2010  . DM (diabetes mellitus) ([place]) 11/20/2010  . Hot flashes secondary to Arimidex 11/20/2010  . 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 [place], [name], [address] Phone: [phone]   Fax:  [phone]  Name: SHANTAY SONN MRN: [id number] Date of Birth: 21-Sep-1960

## 2015-11-29 ENCOUNTER — Ambulatory Visit (HOSPITAL_COMMUNITY): Payer: BC Managed Care – PPO | Admitting: Physical Therapy

## 2015-11-29 DIAGNOSIS — M25611 Stiffness of right shoulder, not elsewhere classified: Secondary | ICD-10-CM

## 2015-11-29 DIAGNOSIS — M25612 Stiffness of left shoulder, not elsewhere classified: Secondary | ICD-10-CM

## 2015-11-29 NOTE — Therapy (Signed)
[place], [name], [address] Phone: [phone]   Fax:  [phone]  Physical Therapy Treatment  Patient Details  Name: Kelly Jacobson MRN: [id number] Date of Birth: May 25, 1960 Referring Provider: Stark Klein  Encounter Date: 11/29/2015      PT End of Session - 11/29/15 1204    Visit Number 5   Number of Visits 18   Date for PT Re-Evaluation 12/09/15   Authorization Type BCBS   Authorization - Visit Number 5   Authorization - Number of Visits 10   PT Start Time 1122   PT Stop Time 1200   PT Time Calculation (min) 38 min   Activity Tolerance Patient tolerated treatment well   Behavior During Therapy [facility] for tasks assessed/performed      Past Medical History:  Diagnosis Date  . Anxiety    Xanax  . Arthritis    Hands and legs  . 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 Complication of anesthesia    SLOW TO WAKE UP  . Depression    wellburtin  . Diabetes mellitus   . DM (diabetes mellitus) ([place]) 11/20/2010  . Edema    hands and feet at times, takes as a direutic  . Hot flashes secondary to Arimidex 11/20/2010  . Hyperlipidemia   . Hypothyroidism 11/20/2010   Synthroid  . Thyroid disease   . Varicose veins 2014    Past Surgical History:  Procedure Laterality Date  . ABDOMINAL HYSTERECTOMY  2001   vaginal for endometriosis  . axillary cyst  01/25/2009  . AXILLARY NODE DISSECTION  02/23/2009 and 03/16/2009  . BILATERAL OOPHORECTOMY  04/19/2009  . BREAST REDUCTION SURGERY  1991  . CARPAL TUNNEL RELEASE  04/06/1998  . CARPAL TUNNEL RELEASE  [id number]   lt hand  . CERVICAL FUSION  01/15/2007  . COLONOSCOPY N/A 05/07/2013   Procedure: COLONOSCOPY;  Surgeon: Danie Binder, MD;  [facility];  Service: Endoscopy;  Laterality: N/A;  8:30 AM  . DEBRIDEMENT TENNIS ELBOW  2003  . PORT-A-CATH REMOVAL  01/16/2011   Procedure: REMOVAL PORT-A-CATH;   Surgeon: Stark Klein, MD;  Location: [facility];  Service: General;  Laterality: N/A;  Removal port-a-cath.  Sol Passer PLACEMENT  05/02/2009  . RECTOCELE REPAIR  2012  . rectocele surgery  03/07/10  . TOTAL MASTECTOMY Bilateral 01/13/2015   Procedure: BILATERAL MASTECTOMY;  Surgeon: Stark Klein, MD;  [facility];  Service: General;  Laterality: Bilateral;  . trigger thumb surgery  02/01/2010  . VEIN SURGERY  2005 / 2006 / 2014   BOTH LEGS    There were no vitals filed for this visit.      Subjective Assessment - 11/29/15 1128    Subjective Pt stated she feels the massage has assisted the most, feels minimal improvements with HEP though does reports compliance.  No reports of pain today   Pertinent History Pt developed infection and necrotic eschar on the Lt incision with small dehiscence on the right.                           [facility] Adult PT Treatment/Exercise - 11/29/15 0001      Shoulder Exercises: Standing   Flexion 10 reps   Flexion Limitations wall walk 10" holds     Manual Therapy   Manual Therapy Myofascial release;Passive ROM   Manual therapy comments completed seperately  from all other interventions   Myofascial Release to bilateral scar tissue in pecs and chest into anterior deltoid   Passive ROM all shoulder motions in supine                  PT Short Term Goals - 11/09/15 1208      PT SHORT TERM GOAL #1   Title Pt flexion to improve Bilaterally to 170 degrees to allow pt to be able to place items into higher cabinets without difficulty    Time 3   Period Weeks   Status New     PT SHORT TERM GOAL #2   Title Pt abduction to be increased to 150 degrees bilaterally to allow pt to mop for up to 30 minutes without having to rest    Time 3   Period Weeks   Status New     PT SHORT TERM GOAL #3   Title Pt to be independent in home exercise to allow goals to be met.     Time 3   Period Weeks           PT Long Term Goals - 11/09/15  1211      PT LONG TERM GOAL #1   Title Pt abduction to be at least 160 degrees bilaterally to allow pt to complete work activity without having to take rest breaks    Time 6   Period Weeks   Status New     PT LONG TERM GOAL #2   Title Pt to be independent in self scar massage for self care at discharge    Time 6   Period Weeks   Status New     PT LONG TERM GOAL #3   Title Pt strength to be at least a 4+/5 in bilateral shoulders to allow pt to complete a full day at work without fatigue    Time 6   Period Weeks   Status New               Plan - 11/29/15 1204    Clinical Impression Statement Continued session focus with manual myofascial release technqiues to address scar adhesions and improve UE mobility.  Pt reports he daughter has began manual techniques at home.  Added wall walking to improve flexion and abduction with good form demonstrated.  no reports of pain through session..   Rehab Potential Good   PT Frequency 3x / week   PT Duration 6 weeks   PT Treatment/Interventions ADLs/Self Care Home Management;Therapeutic activities;Therapeutic exercise;Patient/family education;Manual techniques;Scar mobilization;Passive range of motion;Other (comment)   PT Next Visit Plan continue to progress with large amount spent on manual techniques to release scar tissue.     PT Home Exercise Plan Eval: supine shoulder flexion AAROM c wand; supine shoulder scaption+ER AAROM c wand; seated shoulder GHJ rotation AROM (deferred); 11/8: towel roll stretch, seated scap retraction.       Patient will benefit from skilled therapeutic intervention in order to improve the following deficits and impairments:  Decreased activity tolerance, Decreased range of motion, Decreased scar mobility, Decreased strength, Increased fascial restricitons, Impaired flexibility  Visit Diagnosis: Stiffness of right shoulder, not elsewhere classified  Stiffness of left shoulder, not elsewhere  classified     Problem List Patient Active Problem List   Diagnosis Date Noted  . 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 Family history of colon cancer 09/02/2014  . Personal history of breast cancer 09/02/2014  . Family  history of cancer 09/02/2014  . 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  . Genetic testing 08/30/2014  . Hypothyroidism 11/20/2010  . DM (diabetes mellitus) ([place]) 11/20/2010  . Hot flashes secondary to Arimidex 11/20/2010  . Infiltrating ductal carcinoma of breast [facility]) 09/06/2010   Ihor Austin, [place]; [place]  Aldona Lento 11/29/2015, 12:09 PM  [place], [name], [address] Phone: [phone]   Fax:  [phone]  Name: Kelly Jacobson MRN: [id number] Date of Birth: [birth year]

## 2015-12-05 ENCOUNTER — Ambulatory Visit (HOSPITAL_COMMUNITY): Payer: BC Managed Care – PPO | Admitting: Physical Therapy

## 2015-12-05 DIAGNOSIS — M25612 Stiffness of left shoulder, not elsewhere classified: Secondary | ICD-10-CM

## 2015-12-05 DIAGNOSIS — M25611 Stiffness of right shoulder, not elsewhere classified: Secondary | ICD-10-CM | POA: Diagnosis not present

## 2015-12-05 NOTE — Therapy (Signed)
Kelly Jacobson, [name], [address] Phone: [phone]   Fax:  [phone]  Physical Therapy Treatment  Patient Details  Name: Kelly Jacobson MRN: [id number] Date of Birth: 07/11/1960 Referring Provider: Stark Klein  Encounter Date: 12/05/2015      PT End of Session - 12/05/15 1209    Visit Number 6   Number of Visits 18   Date for PT Re-Evaluation 12/09/15   Authorization Type BCBS   Authorization - Visit Number 6   Authorization - Number of Visits 10   PT Start Time 1122   PT Stop Time 1200   PT Time Calculation (min) 38 min   Activity Tolerance Patient tolerated treatment well   Behavior During Therapy [facility] for tasks assessed/performed      Past Medical History:  Diagnosis Date  . Anxiety    Xanax  . Arthritis    Hands and legs  . 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 Complication of anesthesia    SLOW TO WAKE UP  . Depression    wellburtin  . Diabetes mellitus   . DM (diabetes mellitus) ([place]) 11/20/2010  . Edema    hands and feet at times, takes as a direutic  . Hot flashes secondary to Arimidex 11/20/2010  . Hyperlipidemia   . Hypothyroidism 11/20/2010   Synthroid  . Thyroid disease   . Varicose veins 2014    Past Surgical History:  Procedure Laterality Date  . ABDOMINAL HYSTERECTOMY  2001   vaginal for endometriosis  . axillary cyst  01/25/2009  . AXILLARY NODE DISSECTION  02/23/2009 and 03/16/2009  . BILATERAL OOPHORECTOMY  04/19/2009  . BREAST REDUCTION SURGERY  1991  . CARPAL TUNNEL RELEASE  04/06/1998  . CARPAL TUNNEL RELEASE  [id number]   lt hand  . CERVICAL FUSION  01/15/2007  . COLONOSCOPY N/A 05/07/2013   Procedure: COLONOSCOPY;  Surgeon: Danie Binder, MD;  [facility];  Service: Endoscopy;  Laterality: N/A;  8:30 AM  . DEBRIDEMENT TENNIS ELBOW  2003  . PORT-A-CATH REMOVAL  01/16/2011   Procedure: REMOVAL PORT-A-CATH;   Surgeon: Stark Klein, MD;  Location: [facility];  Service: General;  Laterality: N/A;  Removal port-a-cath.  Sol Passer PLACEMENT  05/02/2009  . RECTOCELE REPAIR  2012  . rectocele surgery  03/07/10  . TOTAL MASTECTOMY Bilateral 01/13/2015   Procedure: BILATERAL MASTECTOMY;  Surgeon: Stark Klein, MD;  [facility];  Service: General;  Laterality: Bilateral;  . trigger thumb surgery  02/01/2010  . VEIN SURGERY  2005 / 2006 / 2014   BOTH LEGS    There were no vitals filed for this visit.      Subjective Assessment - 12/05/15 1208    Subjective Pt reports she's doing so much better.  STates her daughter is doing her massage as well since we educated her.  She is exstatic that she can get her arms up now.   Currently in Pain? No/denies                         [facility] Adult PT Treatment/Exercise - 12/05/15 1209      Shoulder Exercises: Standing   Flexion 10 reps   Flexion Limitations wall walk 10" holds     Manual Therapy   Manual Therapy Myofascial release;Passive ROM   Manual therapy comments completed seperately from all other interventions   Myofascial Release  to bilateral scar tissue in pecs and chest into anterior deltoid   Passive ROM all shoulder motions in supine                  PT Short Term Goals - 11/09/15 1208      PT SHORT TERM GOAL #1   Title Pt flexion to improve Bilaterally to 170 degrees to allow pt to be able to place items into higher cabinets without difficulty    Time 3   Period Weeks   Status New     PT SHORT TERM GOAL #2   Title Pt abduction to be increased to 150 degrees bilaterally to allow pt to mop for up to 30 minutes without having to rest    Time 3   Period Weeks   Status New     PT SHORT TERM GOAL #3   Title Pt to be independent in home exercise to allow goals to be met.     Time 3   Period Weeks           PT Long Term Goals - 11/09/15 1211      PT LONG TERM GOAL #1   Title Pt abduction to be at least 160  degrees bilaterally to allow pt to complete work activity without having to take rest breaks    Time 6   Period Weeks   Status New     PT LONG TERM GOAL #2   Title Pt to be independent in self scar massage for self care at discharge    Time 6   Period Weeks   Status New     PT LONG TERM GOAL #3   Title Pt strength to be at least a 4+/5 in bilateral shoulders to allow pt to complete a full day at work without fatigue    Time 6   Period Weeks   Status New               Plan - 12/05/15 1209    Clinical Impression Statement Continued with scar massage and myofascial release techniques to decrease adhesions in bilateral chest region.  NOted improvement in UE mobility with full ROM in RT and near full with LT.  Pt is independent with HEP.   Rehab Potential Good   PT Frequency 3x / week   PT Duration 6 weeks   PT Treatment/Interventions ADLs/Self Care Home Management;Therapeutic activities;Therapeutic exercise;Patient/family education;Manual techniques;Scar mobilization;Passive range of motion;Other (comment)   PT Next Visit Plan continue to progress with large amount spent on manual techniques to release scar tissue.  Re-eval next session   PT Home Exercise Plan Eval: supine shoulder flexion AAROM c wand; supine shoulder scaption+ER AAROM c wand; seated shoulder GHJ rotation AROM (deferred); 11/8: towel roll stretch, seated scap retraction.       Patient will benefit from skilled therapeutic intervention in order to improve the following deficits and impairments:  Decreased activity tolerance, Decreased range of motion, Decreased scar mobility, Decreased strength, Increased fascial restricitons, Impaired flexibility  Visit Diagnosis: Stiffness of right shoulder, not elsewhere classified  Stiffness of left shoulder, not elsewhere classified     Problem List Patient Active Problem List   Diagnosis Date Noted  . Genetic predisposition to breast cancer s/p bilateral  mastectomies 01/13/2015 01/13/2015  . Family history of colon cancer 09/02/2014  . Personal history of breast cancer 09/02/2014  . Family history of cancer 09/02/2014  . 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  . Genetic testing  08/30/2014  . Hypothyroidism 11/20/2010  . DM (diabetes mellitus) ([place]) 11/20/2010  . Hot flashes secondary to Arimidex 11/20/2010  . Infiltrating ductal carcinoma of breast ([place]) 09/06/2010    Teena Irani, PTA/CLT [phone]  12/05/2015, 12:11 PM  [place] [address] [place], [name], [address] Phone: [phone]   Fax:  [phone]  Name: Kelly Jacobson MRN: [id number] Date of Birth: 12-Aug-1960

## 2015-12-07 ENCOUNTER — Telehealth (HOSPITAL_COMMUNITY): Payer: Self-pay | Admitting: Internal Medicine

## 2015-12-07 ENCOUNTER — Ambulatory Visit (HOSPITAL_COMMUNITY): Payer: BC Managed Care – PPO | Admitting: Physical Therapy

## 2015-12-07 NOTE — Telephone Encounter (Signed)
12/07/15 pt called to cx but Kelly Jacobson took the message and no reason was written on the sticky note that she gave me.

## 2015-12-09 ENCOUNTER — Ambulatory Visit (HOSPITAL_COMMUNITY): Payer: BC Managed Care – PPO | Attending: General Surgery

## 2015-12-09 DIAGNOSIS — M25611 Stiffness of right shoulder, not elsewhere classified: Secondary | ICD-10-CM | POA: Insufficient documentation

## 2015-12-09 DIAGNOSIS — R29898 Other symptoms and signs involving the musculoskeletal system: Secondary | ICD-10-CM | POA: Insufficient documentation

## 2015-12-09 DIAGNOSIS — M25612 Stiffness of left shoulder, not elsewhere classified: Secondary | ICD-10-CM | POA: Diagnosis present

## 2015-12-09 NOTE — Therapy (Signed)
[place], [name], [address] Phone: [phone]   Fax:  [phone]  Physical Therapy Treatment  Patient Details  Name: Kelly Jacobson MRN: [id number] Date of Birth: 1960-07-14 Referring Provider: Stark Klein  Encounter Date: 12/09/2015      PT End of Session - 12/09/15 1214    Visit Number 7   Number of Visits 18   Date for PT Re-Evaluation 12/09/15   Authorization Type BCBS   Authorization - Visit Number 7   Authorization - Number of Visits 10   PT Start Time 6606   PT Stop Time 1205   PT Time Calculation (min) 41 min   Activity Tolerance Patient tolerated treatment well   Behavior During Therapy [facility] for tasks assessed/performed      Past Medical History:  Diagnosis Date  . Anxiety    Xanax  . Arthritis    Hands and legs  . 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 Complication of anesthesia    SLOW TO WAKE UP  . Depression    wellburtin  . Diabetes mellitus   . DM (diabetes mellitus) ([place]) 11/20/2010  . Edema    hands and feet at times, takes as a direutic  . Hot flashes secondary to Arimidex 11/20/2010  . Hyperlipidemia   . Hypothyroidism 11/20/2010   Synthroid  . Thyroid disease   . Varicose veins 2014    Past Surgical History:  Procedure Laterality Date  . ABDOMINAL HYSTERECTOMY  2001   vaginal for endometriosis  . axillary cyst  01/25/2009  . AXILLARY NODE DISSECTION  02/23/2009 and 03/16/2009  . BILATERAL OOPHORECTOMY  04/19/2009  . BREAST REDUCTION SURGERY  1991  . CARPAL TUNNEL RELEASE  04/06/1998  . CARPAL TUNNEL RELEASE  [id number]   lt hand  . CERVICAL FUSION  01/15/2007  . COLONOSCOPY N/A 05/07/2013   Procedure: COLONOSCOPY;  Surgeon: Danie Binder, MD;  [facility];  Service: Endoscopy;  Laterality: N/A;  8:30 AM  . DEBRIDEMENT TENNIS ELBOW  2003  . PORT-A-CATH REMOVAL  01/16/2011   Procedure: REMOVAL PORT-A-CATH;   Surgeon: Stark Klein, MD;  Location: [facility];  Service: General;  Laterality: N/A;  Removal port-a-cath.  Sol Passer PLACEMENT  05/02/2009  . RECTOCELE REPAIR  2012  . rectocele surgery  03/07/10  . TOTAL MASTECTOMY Bilateral 01/13/2015   Procedure: BILATERAL MASTECTOMY;  Surgeon: Stark Klein, MD;  [facility];  Service: General;  Laterality: Bilateral;  . trigger thumb surgery  02/01/2010  . VEIN SURGERY  2005 / 2006 / 2014   BOTH LEGS    There were no vitals filed for this visit.      Subjective Assessment - 12/09/15 1127    Subjective Pt says things are improving sincestarting therapy. She says that she is able to complete mopping at work without having to stop now.    Pertinent History Pt developed infection and necrotic eschar on the Lt incision with small dehiscence on the right.     Patient Stated Goals To be able to mop floors easier      Currently in Pain? No/denies            [facility] PT Assessment - 12/09/15 0001      Assessment   Medical Diagnosis scar contrature   Referring Provider Stark Klein   Onset Date/Surgical Date 01/13/15   Next MD Visit None scheduled   Prior Therapy  none     Precautions   Precautions None     Restrictions   Weight Bearing Restrictions No     Balance Screen   Has the patient fallen in the past 6 months No   Has the patient had a decrease in activity level because of a fear of falling?  No   Is the patient reluctant to leave their home because of a fear of falling?  No     Prior Function   Vocation Full time employment   Vocation Requirements custodian    Leisure bake      Posture/Postural Control   Posture Comments Coracoid to xiphoid supine:   21cm Rt; 24cm Lt     AROM   AROM Assessment Site Shoulder   Right/Left Shoulder Right;Left   Right Shoulder Flexion 145 Degrees  (150 at eval)    Right Shoulder ABduction 165 Degrees  (120 at eval)   Right Shoulder Internal Rotation 60 Degrees  (55 at eval)   Right Shoulder  External Rotation 81 Degrees  (65 at eval)   Left Shoulder Flexion 151 Degrees   Left Shoulder ABduction 168 Degrees  (115 at eval)   Left Shoulder Internal Rotation 61 Degrees   Left Shoulder External Rotation 82 Degrees  (65 at eval)     Strength   Right Shoulder Extension 4+/5  (4+/5 at eval) mid trap   Right Shoulder Internal Rotation 5/5  (3+/5 at eval)   Right Shoulder External Rotation 5/5  (4+/5 at eval)   Right Shoulder Horizontal ABduction 5/5   Left Shoulder Extension 5/5  (5/5 at eval) mid trap   Left Shoulder Internal Rotation 5/5  (4+/5 at eval)   Left Shoulder External Rotation 5/5  (5/5 at eval)   Left Shoulder Horizontal ABduction 5/5                     [facility] Adult PT Treatment/Exercise - 12/09/15 0001      Shoulder Exercises: Standing   Horizontal ABduction Theraband;20 reps;Strengthening;Both;Other (comment)   Theraband Level (Shoulder Horizontal ABduction) Level 4 (Blue)   Horizontal ABduction Weight (lbs) --  (HEP addition)   Horizontal ABduction Limitations shoulds at 90 degrees   Flexion Strengthening;Both;20 reps;Theraband   Theraband Level (Shoulder Flexion) Level 4 (Blue)   Shoulder Flexion Weight (lbs) --  HEP addition, difficulty with form   Flexion Limitations +contralateral shoulder extension.   Other Standing Exercises Horizontal Abduction                PT Education - 12/09/15 1209    Education provided Yes   Education Details Time taken to educate patient on implications of objective measures today, why they are being taken, and how they pertain to overall function on the job. She has a difficult time logically relating these two ideas. The majority of this education si eventually recieved well, however more difficult concepts are not gained and a turn toward disinterest via frustration ends education session.    Person(s) Educated Patient   Methods Explanation   Comprehension Verbalized understanding;Returned  demonstration          PT Short Term Goals - 12/09/15 1147      PT SHORT TERM GOAL #1   Title Pt flexion to improve Bilaterally to 170 degrees to allow pt to be able to place items into higher cabinets without difficulty    Baseline range nuchanged, but overall function at home is perfomed without limitation.    Time 3  Period Weeks   Status Deferred     PT SHORT TERM GOAL #2   Title Pt abduction to be increased to 150 degrees bilaterally to allow pt to mop for up to 30 minutes without having to rest    Baseline Has not had to mop for that long since PT began, however performs all mopping pain free at this point (10-15 minutes)    Time 3   Period Weeks   Status Unable to assess     PT SHORT TERM GOAL #3   Title Pt to be independent in home exercise to allow goals to be met.     Time 3   Period Weeks   Status Achieved           PT Long Term Goals - 12/09/15 1150      PT LONG TERM GOAL #1   Title Pt abduction to be at least 160 degrees bilaterally to allow pt to complete work activity without having to take rest breaks    Time 6   Period Weeks   Status Achieved     PT LONG TERM GOAL #2   Title Pt to be independent in self scar massage for self care at discharge    Baseline Scar massage has beenbenefitital, pt still unabel to perform independnetly. Daughter has been assisting at home 1x weekly.    Time 6   Period Weeks   Status On-going     PT LONG TERM GOAL #3   Title Pt strength to be at least a 4+/5 in bilateral shoulders to allow pt to complete a full day at work without fatigue    Baseline Progress being made. middle lower trap still somewhat weak and limited in range.    Time 6   Period Weeks   Status Partially Met               Plan - 12/09/15 1214    Clinical Impression Statement Reassessment performed today, patient demonstrating improvements in ROM in the shoulders bilat, and strength in the shoudlers bialt, with inittial impairment resolved by  75-85%. Postural limitations are somewhat improved, but remain limited due to lpersistet scapular weakness even after improved PROM. Greatest limitations continue to be tightness in the anterior thorax with tendency toward protraction and flexion, and weakness of the scapul;ar retractors and decressors. Postural deficits greater on rt side. Pt remains minimally engaging throughtou session, often responding with confusion, furstration, and/or sarcasm, however additional quesitoning does not help the patient clearly express her thoughts or feelings triggering these responses. Pt continues to reports poor compliance with HEP, only performing one of the stretches, and reports she does not do the ones she does not like. When encouraging her compliance with the new HEP additions, she reports that she has no time to do HEP, but eventually commits to 10 minutes of time daily. Anticipate early DC from PT due to accelerated prgress.    Rehab Potential Good   PT Frequency 3x / week   PT Duration 6 weeks   PT Treatment/Interventions ADLs/Self Care Home Management;Therapeutic activities;Therapeutic exercise;Patient/family education;Manual techniques;Scar mobilization;Passive range of motion;Other (comment)   PT Next Visit Plan Repeat new HEP items with focus on form and heavy emphasis on compliance. soft tissue release at anterior chest and strengthening of scapular retractors and depressors.    PT Home Exercise Plan Eval: supine shoulder flexion AAROM c wand; supine shoulder scaption+ER AAROM c wand; seated shoulder GHJ rotation AROM (deferred); 11/8: towel roll stretch, seated  scap retraction.: 12/1 added blueTB horizontal abduction, and standing scaption+contrlateral extension   Consulted and Agree with Plan of Care Patient      Patient will benefit from skilled therapeutic intervention in order to improve the following deficits and impairments:  Decreased activity tolerance, Decreased range of motion, Decreased  scar mobility, Decreased strength, Increased fascial restricitons, Impaired flexibility  Visit Diagnosis: Stiffness of right shoulder, not elsewhere classified  Stiffness of left shoulder, not elsewhere classified     Problem List Patient Active Problem List   Diagnosis Date Noted  . 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 Family history of colon cancer 09/02/2014  . Personal history of breast cancer 09/02/2014  . Family history of cancer 09/02/2014  . 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  . Genetic testing 08/30/2014  . Hypothyroidism 11/20/2010  . DM (diabetes mellitus) ([place]) 11/20/2010  . Hot flashes secondary to Arimidex 11/20/2010  . Infiltrating ductal carcinoma of breast ([place]) 09/06/2010    12:32 PM, 12/09/15 Kelly Jacobson, PT, DPT Physical Therapist at [place] [phone] (office)     [place] [address] [place], [name], [address] Phone: [phone]   Fax:  [phone]  Name: Kelly Jacobson MRN: [id number] Date of Birth: 1960/07/09

## 2015-12-12 ENCOUNTER — Ambulatory Visit (HOSPITAL_COMMUNITY): Payer: BC Managed Care – PPO | Admitting: Physical Therapy

## 2015-12-12 DIAGNOSIS — M25611 Stiffness of right shoulder, not elsewhere classified: Secondary | ICD-10-CM

## 2015-12-12 DIAGNOSIS — M25612 Stiffness of left shoulder, not elsewhere classified: Secondary | ICD-10-CM

## 2015-12-12 NOTE — Therapy (Signed)
[place], [name], [address] Phone: [phone]   Fax:  [phone]  Physical Therapy Treatment  Patient Details  Name: Kelly Jacobson MRN: [id number] Date of Birth: 1960/04/01 Referring Provider: Stark Klein  Encounter Date: 12/12/2015      PT End of Session - 12/12/15 1117    Visit Number 8   Number of Visits 18   Date for PT Re-Evaluation 12/09/15   Authorization Type BCBS   Authorization - Visit Number 8   Authorization - Number of Visits 18   PT Start Time 6962   PT Stop Time 1115   PT Time Calculation (min) 38 min   Activity Tolerance Patient tolerated treatment well   Behavior During Therapy [facility] for tasks assessed/performed      Past Medical History:  Diagnosis Date  . Anxiety    Xanax  . Arthritis    Hands and legs  . 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 Complication of anesthesia    SLOW TO WAKE UP  . Depression    wellburtin  . Diabetes mellitus   . DM (diabetes mellitus) ([place]) 11/20/2010  . Edema    hands and feet at times, takes as a direutic  . Hot flashes secondary to Arimidex 11/20/2010  . Hyperlipidemia   . Hypothyroidism 11/20/2010   Synthroid  . Thyroid disease   . Varicose veins 2014    Past Surgical History:  Procedure Laterality Date  . ABDOMINAL HYSTERECTOMY  2001   vaginal for endometriosis  . axillary cyst  01/25/2009  . AXILLARY NODE DISSECTION  02/23/2009 and 03/16/2009  . BILATERAL OOPHORECTOMY  04/19/2009  . BREAST REDUCTION SURGERY  1991  . CARPAL TUNNEL RELEASE  04/06/1998  . CARPAL TUNNEL RELEASE  [id number]   lt hand  . CERVICAL FUSION  01/15/2007  . COLONOSCOPY N/A 05/07/2013   Procedure: COLONOSCOPY;  Surgeon: Danie Binder, MD;  [facility];  Service: Endoscopy;  Laterality: N/A;  8:30 AM  . DEBRIDEMENT TENNIS ELBOW  2003  . PORT-A-CATH REMOVAL  01/16/2011   Procedure: REMOVAL PORT-A-CATH;   Surgeon: Stark Klein, MD;  Location: [place];  Service: General;  Laterality: N/A;  Removal port-a-cath.  Sol Passer PLACEMENT  05/02/2009  . RECTOCELE REPAIR  2012  . rectocele surgery  03/07/10  . TOTAL MASTECTOMY Bilateral 01/13/2015   Procedure: BILATERAL MASTECTOMY;  Surgeon: Stark Klein, MD;  [facility];  Service: General;  Laterality: Bilateral;  . trigger thumb surgery  02/01/2010  . VEIN SURGERY  2005 / 2006 / 2014   BOTH LEGS    There were no vitals filed for this visit.      Subjective Assessment - 12/12/15 1059    Subjective Pt reports the stretches are helping her the most.  no pain just tightness.   Currently in Pain? No/denies                         [facility] Adult PT Treatment/Exercise - 12/12/15 0001      Shoulder Exercises: Standing   Horizontal ABduction Theraband;20 reps;Strengthening;Both;Other (comment)   Theraband Level (Shoulder Horizontal ABduction) Level 2 (Red)   Horizontal ABduction Limitations shoulds at 90 degrees   Extension Strengthening;10 reps;Theraband   Theraband Level (Shoulder Extension) Level 2 (Red)   Row Strengthening;10 reps;Theraband   Theraband Level (Shoulder Row) Level 2 (Red)   Retraction Strengthening;10 reps;Theraband  Theraband Level (Shoulder Retraction) Level 2 (Red)     Manual Therapy   Manual Therapy Myofascial release;Passive ROM   Manual therapy comments completed seperately from all other interventions   Myofascial Release to bilateral scar tissue in pecs and chest into anterior deltoid   Passive ROM all shoulder motions in supine                PT Education - 12/12/15 1122    Education provided Yes   Education Details reviewed updated HEP from last session and given postural 3 activity to add to HEP with red theraband.   Person(s) Educated Patient   Methods Explanation;Demonstration   Comprehension Verbalized understanding;Returned demonstration          PT Short Term Goals -  12/09/15 1147      PT SHORT TERM GOAL #1   Title Pt flexion to improve Bilaterally to 170 degrees to allow pt to be able to place items into higher cabinets without difficulty    Baseline range nuchanged, but overall function at home is perfomed without limitation.    Time 3   Period Weeks   Status Deferred     PT SHORT TERM GOAL #2   Title Pt abduction to be increased to 150 degrees bilaterally to allow pt to mop for up to 30 minutes without having to rest    Baseline Has not had to mop for that long since PT began, however performs all mopping pain free at this point (10-15 minutes)    Time 3   Period Weeks   Status Unable to assess     PT SHORT TERM GOAL #3   Title Pt to be independent in home exercise to allow goals to be met.     Time 3   Period Weeks   Status Achieved           PT Long Term Goals - 12/09/15 1150      PT LONG TERM GOAL #1   Title Pt abduction to be at least 160 degrees bilaterally to allow pt to complete work activity without having to take rest breaks    Time 6   Period Weeks   Status Achieved     PT LONG TERM GOAL #2   Title Pt to be independent in self scar massage for self care at discharge    Baseline Scar massage has beenbenefitital, pt still unabel to perform independnetly. Daughter has been assisting at home 1x weekly.    Time 6   Period Weeks   Status On-going     PT LONG TERM GOAL #3   Title Pt strength to be at least a 4+/5 in bilateral shoulders to allow pt to complete a full day at work without fatigue    Baseline Progress being made. middle lower trap still somewhat weak and limited in range.    Time 6   Period Weeks   Status Partially Met               Plan - 12/12/15 1123    Clinical Impression Statement Pt with reports of extreme soreenss and difficulty with added exercises.  Able to decrease to red theraband with much improvement and comfort.  Also added postural 3 exercise per postural weakness noted in reassessment.     Pt able to demonstrate these correctly without discomfort.  Continued with soft tissue mobilization and myofascial techniques to tight pectoral scar tissue.  Noted improvement with functional ROM in bilateral UE's.  Pt  pleased with progress.   Rehab Potential Good   PT Frequency 3x / week   PT Duration 6 weeks   PT Treatment/Interventions ADLs/Self Care Home Management;Therapeutic activities;Therapeutic exercise;Patient/family education;Manual techniques;Scar mobilization;Passive range of motion;Other (comment)   PT Next Visit Plan continue with primary focus on manual and scapular strengthning.    PT Home Exercise Plan Eval: supine shoulder flexion AAROM c wand; supine shoulder scaption+ER AAROM c wand; seated shoulder GHJ rotation AROM (deferred); 11/8: towel roll stretch, seated scap retraction.: 12/1 added blueTB horizontal abduction, and standing scaption+contrlateral extension   Consulted and Agree with Plan of Care Patient      Patient will benefit from skilled therapeutic intervention in order to improve the following deficits and impairments:  Decreased activity tolerance, Decreased range of motion, Decreased scar mobility, Decreased strength, Increased fascial restricitons, Impaired flexibility  Visit Diagnosis: Stiffness of right shoulder, not elsewhere classified  Stiffness of left shoulder, not elsewhere classified     Problem List Patient Active Problem List   Diagnosis Date Noted  . 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 Family history of colon cancer 09/02/2014  . Personal history of breast cancer 09/02/2014  . Family history of cancer 09/02/2014  . 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  . Genetic testing 08/30/2014  . Hypothyroidism 11/20/2010  . DM (diabetes mellitus) ([place]) 11/20/2010  . Hot flashes secondary to Arimidex 11/20/2010  . Infiltrating ductal carcinoma of breast [facility]) 09/06/2010    Teena Irani, PTA/CLT [phone]  12/12/2015, 11:27 AM  [place] [address] [place], [name], [address] Phone: [phone]   Fax:  [phone]  Name: EMELEE RODOCKER MRN: [id number] Date of Birth: January 11, 1960

## 2015-12-14 ENCOUNTER — Ambulatory Visit (HOSPITAL_COMMUNITY): Payer: BC Managed Care – PPO | Admitting: Physical Therapy

## 2015-12-14 DIAGNOSIS — M25611 Stiffness of right shoulder, not elsewhere classified: Secondary | ICD-10-CM | POA: Diagnosis not present

## 2015-12-14 DIAGNOSIS — M25612 Stiffness of left shoulder, not elsewhere classified: Secondary | ICD-10-CM

## 2015-12-14 NOTE — Therapy (Signed)
[place], [name], [address] Phone: [phone]   Fax:  [phone]  Physical Therapy Treatment  Patient Details  Name: Kelly Jacobson MRN: [id number] Date of Birth: Oct 10, 1960 Referring Provider: Stark Klein  Encounter Date: 12/14/2015      PT End of Session - 12/14/15 1212    Visit Number 9   Number of Visits 18   Date for PT Re-Evaluation 12/09/15   Authorization Type BCBS   Authorization - Visit Number 9   Authorization - Number of Visits 18   PT Start Time 9169   PT Stop Time 1207   PT Time Calculation (min) 41 min   Activity Tolerance Patient tolerated treatment well   Behavior During Therapy [facility] for tasks assessed/performed      Past Medical History:  Diagnosis Date  . Anxiety    Xanax  . Arthritis    Hands and legs  . 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 Complication of anesthesia    SLOW TO WAKE UP  . Depression    wellburtin  . Diabetes mellitus   . DM (diabetes mellitus) ([place]) 11/20/2010  . Edema    hands and feet at times, takes as a direutic  . Hot flashes secondary to Arimidex 11/20/2010  . Hyperlipidemia   . Hypothyroidism 11/20/2010   Synthroid  . Thyroid disease   . Varicose veins 2014    Past Surgical History:  Procedure Laterality Date  . ABDOMINAL HYSTERECTOMY  2001   vaginal for endometriosis  . axillary cyst  01/25/2009  . AXILLARY NODE DISSECTION  02/23/2009 and 03/16/2009  . BILATERAL OOPHORECTOMY  04/19/2009  . BREAST REDUCTION SURGERY  1991  . CARPAL TUNNEL RELEASE  04/06/1998  . CARPAL TUNNEL RELEASE  [id number]   lt hand  . CERVICAL FUSION  01/15/2007  . COLONOSCOPY N/A 05/07/2013   Procedure: COLONOSCOPY;  Surgeon: Danie Binder, MD;  [facility];  Service: Endoscopy;  Laterality: N/A;  8:30 AM  . DEBRIDEMENT TENNIS ELBOW  2003  . PORT-A-CATH REMOVAL  01/16/2011   Procedure: REMOVAL PORT-A-CATH;   Surgeon: Stark Klein, MD;  Location: [place];  Service: General;  Laterality: N/A;  Removal port-a-cath.  Sol Passer PLACEMENT  05/02/2009  . RECTOCELE REPAIR  2012  . rectocele surgery  03/07/10  . TOTAL MASTECTOMY Bilateral 01/13/2015   Procedure: BILATERAL MASTECTOMY;  Surgeon: Stark Klein, MD;  [facility];  Service: General;  Laterality: Bilateral;  . trigger thumb surgery  02/01/2010  . VEIN SURGERY  2005 / 2006 / 2014   BOTH LEGS    There were no vitals filed for this visit.      Subjective Assessment - 12/14/15 1131    Subjective Pt feels that her range in her shoulders is doing well; she is just needs to work on the scaring.                           [facility] Adult PT Treatment/Exercise - 12/14/15 0001      Exercises   Exercises Neck;Shoulder     Neck Exercises: Supine   Other Supine Exercise butterfly stretch 30" x5     Manual Therapy   Manual Therapy Myofascial release;Passive ROM   Manual therapy comments completed seperately from all other interventions   Myofascial Release to bilateral scar tissue in pecs and chest into anterior deltoid  PT Short Term Goals - 12/14/15 1221      PT SHORT TERM GOAL #1   Title Pt flexion to improve Bilaterally to 170 degrees to allow pt to be able to place items into higher cabinets without difficulty    Baseline range nuchanged, but overall function at home is perfomed without limitation.    Time 3   Period Weeks   Status Achieved     PT SHORT TERM GOAL #2   Title Pt abduction to be increased to 150 degrees bilaterally to allow pt to mop for up to 30 minutes without having to rest    Baseline Has not had to mop for that long since PT began, however performs all mopping pain free at this point (10-15 minutes)    Time 3   Period Weeks   Status Achieved     PT SHORT TERM GOAL #3   Title Pt to be independent in home exercise to allow goals to be met.     Time 3   Period Weeks    Status Achieved           PT Long Term Goals - 12/14/15 1221      PT LONG TERM GOAL #1   Title Pt abduction to be at least 160 degrees bilaterally to allow pt to complete work activity without having to take rest breaks    Time 6   Period Weeks   Status Achieved     PT LONG TERM GOAL #2   Title Pt to be independent in self scar massage for self care at discharge    Baseline Scar massage has beenbenefitital, pt still unabel to perform independnetly. Daughter has been assisting at home 1x weekly.    Time 6   Period Weeks   Status On-going     PT LONG TERM GOAL #3   Title Pt strength to be at least a 4+/5 in bilateral shoulders to allow pt to complete a full day at work without fatigue    Baseline Progress being made. middle lower trap still somewhat weak and limited in range.    Time 6   Period Weeks   Status Partially Met               Plan - 12/14/15 1213    Clinical Impression Statement Pt is pleased with her ROM.  Standing ROM is functional.  Treatment will concentrate on myofacial release and scar massage to decrease multiple adhesions in the right and left pectorial region.     Rehab Potential Good   PT Frequency 3x / week   PT Duration 6 weeks   PT Treatment/Interventions ADLs/Self Care Home Management;Therapeutic activities;Therapeutic exercise;Patient/family education;Manual techniques;Scar mobilization;Passive range of motion;Other (comment)   PT Next Visit Plan continue with primary focus on manual    PT Home Exercise Plan Eval: supine shoulder flexion AAROM c wand; supine shoulder scaption+ER AAROM c wand; seated shoulder GHJ rotation AROM (deferred); 11/8: towel roll stretch, seated scap retraction.: 12/1 added blueTB horizontal abduction, and standing scaption+contrlateral extension   Consulted and Agree with Plan of Care Patient      Patient will benefit from skilled therapeutic intervention in order to improve the following deficits and impairments:   Decreased activity tolerance, Decreased range of motion, Decreased scar mobility, Decreased strength, Increased fascial restricitons, Impaired flexibility  Visit Diagnosis: Stiffness of right shoulder, not elsewhere classified  Stiffness of left shoulder, not elsewhere classified     Problem List Patient Active Problem List  Diagnosis Date Noted  . 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 Family history of colon cancer 09/02/2014  . Personal history of breast cancer 09/02/2014  . Family history of cancer 09/02/2014  . 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  . Genetic testing 08/30/2014  . Hypothyroidism 11/20/2010  . DM (diabetes mellitus) ([place]) 11/20/2010  . Hot flashes secondary to Arimidex 11/20/2010  . Infiltrating ductal carcinoma of breast [facility]) 09/06/2010    Rayetta Humphrey, PT CLT [phone] 12/14/2015, 12:23 PM  [place] [address] [place], [name], [address] Phone: [phone]   Fax:  [phone]  Name: Kelly Jacobson MRN: [id number] Date of Birth: [birth year]

## 2015-12-16 ENCOUNTER — Ambulatory Visit (HOSPITAL_COMMUNITY): Payer: BC Managed Care – PPO | Admitting: Physical Therapy

## 2015-12-16 DIAGNOSIS — M25612 Stiffness of left shoulder, not elsewhere classified: Secondary | ICD-10-CM

## 2015-12-16 DIAGNOSIS — M25611 Stiffness of right shoulder, not elsewhere classified: Secondary | ICD-10-CM

## 2015-12-16 NOTE — Therapy (Signed)
[place], [name], [address] Phone: [phone]   Fax:  [phone]  Physical Therapy Treatment  Patient Details  Name: Kelly Jacobson MRN: [id number] Date of Birth: Feb 11, 1960 Referring Provider: Stark Klein  Encounter Date: 12/16/2015      PT End of Session - 12/16/15 1323    Visit Number 10   Number of Visits 18   Date for PT Re-Evaluation 12/09/15   Authorization Type BCBS   Authorization - Visit Number 10   Authorization - Number of Visits 18   PT Start Time 8270   PT Stop Time 7867   PT Time Calculation (min) 39 min   Activity Tolerance Patient tolerated treatment well   Behavior During Therapy [facility] for tasks assessed/performed      Past Medical History:  Diagnosis Date  . Anxiety    Xanax  . Arthritis    Hands and legs  . 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 Complication of anesthesia    SLOW TO WAKE UP  . Depression    wellburtin  . Diabetes mellitus   . DM (diabetes mellitus) ([place]) 11/20/2010  . Edema    hands and feet at times, takes as a direutic  . Hot flashes secondary to Arimidex 11/20/2010  . Hyperlipidemia   . Hypothyroidism 11/20/2010   Synthroid  . Thyroid disease   . Varicose veins 2014    Past Surgical History:  Procedure Laterality Date  . ABDOMINAL HYSTERECTOMY  2001   vaginal for endometriosis  . axillary cyst  01/25/2009  . AXILLARY NODE DISSECTION  02/23/2009 and 03/16/2009  . BILATERAL OOPHORECTOMY  04/19/2009  . BREAST REDUCTION SURGERY  1991  . CARPAL TUNNEL RELEASE  04/06/1998  . CARPAL TUNNEL RELEASE  [id number]   lt hand  . CERVICAL FUSION  01/15/2007  . COLONOSCOPY N/A 05/07/2013   Procedure: COLONOSCOPY;  Surgeon: Danie Binder, MD;  [facility];  Service: Endoscopy;  Laterality: N/A;  8:30 AM  . DEBRIDEMENT TENNIS ELBOW  2003  . PORT-A-CATH REMOVAL  01/16/2011   Procedure: REMOVAL PORT-A-CATH;   Surgeon: Stark Klein, MD;  Location: [place];  Service: General;  Laterality: N/A;  Removal port-a-cath.  Sol Passer PLACEMENT  05/02/2009  . RECTOCELE REPAIR  2012  . rectocele surgery  03/07/10  . TOTAL MASTECTOMY Bilateral 01/13/2015   Procedure: BILATERAL MASTECTOMY;  Surgeon: Stark Klein, MD;  [facility];  Service: General;  Laterality: Bilateral;  . trigger thumb surgery  02/01/2010  . VEIN SURGERY  2005 / 2006 / 2014   BOTH LEGS    There were no vitals filed for this visit.      Subjective Assessment - 12/16/15 1320    Subjective Pt states that she still feels tight in the chest.     Currently in Pain? No/denies                         [facility] Adult PT Treatment/Exercise - 12/16/15 0001      Exercises   Exercises Neck;Shoulder     Neck Exercises: Supine   Other Supine Exercise butterfly stretch 30" x5   Other Supine Exercise Bilateral pectoral stretch (supine UE at 90degree abduction in ER with scapular retraction x 5      Manual Therapy   Manual Therapy Myofascial release;Passive ROM   Manual therapy comments completed seperately from all other  interventions   Myofascial Release to bilateral scar tissue in pecs and chest into anterior deltoid                PT Education - 12/16/15 1322    Education provided Yes   Education Details To buy [employer] to use on adhesive areas   Person(s) Educated Patient   Methods Explanation   Comprehension Verbalized understanding          PT Short Term Goals - 12/14/15 1221      PT SHORT TERM GOAL #1   Title Pt flexion to improve Bilaterally to 170 degrees to allow pt to be able to place items into higher cabinets without difficulty    Baseline range nuchanged, but overall function at home is perfomed without limitation.    Time 3   Period Weeks   Status Achieved     PT SHORT TERM GOAL #2   Title Pt abduction to be increased to 150 degrees bilaterally to allow pt to mop for up to 30 minutes  without having to rest    Baseline Has not had to mop for that long since PT began, however performs all mopping pain free at this point (10-15 minutes)    Time 3   Period Weeks   Status Achieved     PT SHORT TERM GOAL #3   Title Pt to be independent in home exercise to allow goals to be met.     Time 3   Period Weeks   Status Achieved           PT Long Term Goals - 12/14/15 1221      PT LONG TERM GOAL #1   Title Pt abduction to be at least 160 degrees bilaterally to allow pt to complete work activity without having to take rest breaks    Time 6   Period Weeks   Status Achieved     PT LONG TERM GOAL #2   Title Pt to be independent in self scar massage for self care at discharge    Baseline Scar massage has beenbenefitital, pt still unabel to perform independnetly. Daughter has been assisting at home 1x weekly.    Time 6   Period Weeks   Status On-going     PT LONG TERM GOAL #3   Title Pt strength to be at least a 4+/5 in bilateral shoulders to allow pt to complete a full day at work without fatigue    Baseline Progress being made. middle lower trap still somewhat weak and limited in range.    Time 6   Period Weeks   Status Partially Met               Plan - 12/16/15 1324    Clinical Impression Statement Pt continues to feel tight in the scarred area.  We will try laser for the next two treatments and if pt does not feel any benefit we will discharge to self care.  Pt is independent in friction massage and is completing stretches on her own.  ROM of shoulders are functional    Rehab Potential Good   PT Frequency 3x / week   PT Duration 6 weeks   PT Treatment/Interventions ADLs/Self Care Home Management;Therapeutic activities;Therapeutic exercise;Patient/family education;Manual techniques;Scar mobilization;Passive range of motion;Other (comment)   PT Next Visit Plan Trial of laser on adhesions.    PT Home Exercise Plan Eval: supine shoulder flexion AAROM c wand;  supine shoulder scaption+ER AAROM c wand; seated shoulder GHJ  rotation AROM (deferred); 11/8: towel roll stretch, seated scap retraction.: 12/1 added blueTB horizontal abduction, and standing scaption+contrlateral extension   Consulted and Agree with Plan of Care Patient      Patient will benefit from skilled therapeutic intervention in order to improve the following deficits and impairments:  Decreased activity tolerance, Decreased range of motion, Decreased scar mobility, Decreased strength, Increased fascial restricitons, Impaired flexibility  Visit Diagnosis: Stiffness of right shoulder, not elsewhere classified  Stiffness of left shoulder, not elsewhere classified     Problem List Patient Active Problem List   Diagnosis Date Noted  . 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 Family history of colon cancer 09/02/2014  . Personal history of breast cancer 09/02/2014  . Family history of cancer 09/02/2014  . 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  . Genetic testing 08/30/2014  . Hypothyroidism 11/20/2010  . DM (diabetes mellitus) ([place]) 11/20/2010  . Hot flashes secondary to Arimidex 11/20/2010  . Infiltrating ductal carcinoma of breast [facility]) 09/06/2010   Rayetta Humphrey, PT CLT [phone] 12/16/2015, 1:27 PM  [place] [address] [place], [name], [address] Phone: [phone]   Fax:  [phone]  Name: Kelly Jacobson MRN: [id number] Date of Birth: [birth year]

## 2015-12-19 ENCOUNTER — Ambulatory Visit (HOSPITAL_COMMUNITY): Payer: BC Managed Care – PPO | Admitting: Physical Therapy

## 2015-12-19 ENCOUNTER — Telehealth (HOSPITAL_COMMUNITY): Payer: Self-pay | Admitting: Internal Medicine

## 2015-12-19 NOTE — Telephone Encounter (Signed)
12/19/15 pt just said that she needed to cx today's appt

## 2015-12-21 ENCOUNTER — Ambulatory Visit (HOSPITAL_COMMUNITY): Payer: BC Managed Care – PPO | Admitting: Physical Therapy

## 2015-12-21 DIAGNOSIS — M25611 Stiffness of right shoulder, not elsewhere classified: Secondary | ICD-10-CM

## 2015-12-21 DIAGNOSIS — R29898 Other symptoms and signs involving the musculoskeletal system: Secondary | ICD-10-CM

## 2015-12-21 DIAGNOSIS — M25612 Stiffness of left shoulder, not elsewhere classified: Secondary | ICD-10-CM

## 2015-12-21 NOTE — Therapy (Signed)
[place], [name], [address] Phone: [phone]   Fax:  [phone]  Physical Therapy Treatment  Patient Details  Name: Kelly Jacobson MRN: [id number] Date of Birth: 1960-12-19 Referring Provider: Stark Klein   Encounter Date: 12/21/2015      PT End of Session - 12/21/15 1215    Visit Number 11   Number of Visits 18   Date for PT Re-Evaluation 12/09/15   Authorization Type BCBS   Authorization - Visit Number 11   Authorization - Number of Visits 18   PT Start Time 9924   PT Stop Time 1202   PT Time Calculation (min) 38 min   Activity Tolerance Patient tolerated treatment well   Behavior During Therapy [facility] for tasks assessed/performed      Past Medical History:  Diagnosis Date  . Anxiety    Xanax  . Arthritis    Hands and legs  . 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 Complication of anesthesia    SLOW TO WAKE UP  . Depression    wellburtin  . Diabetes mellitus   . DM (diabetes mellitus) ([place]) 11/20/2010  . Edema    hands and feet at times, takes as a direutic  . Hot flashes secondary to Arimidex 11/20/2010  . Hyperlipidemia   . Hypothyroidism 11/20/2010   Synthroid  . Thyroid disease   . Varicose veins 2014    Past Surgical History:  Procedure Laterality Date  . ABDOMINAL HYSTERECTOMY  2001   vaginal for endometriosis  . axillary cyst  01/25/2009  . AXILLARY NODE DISSECTION  02/23/2009 and 03/16/2009  . BILATERAL OOPHORECTOMY  04/19/2009  . BREAST REDUCTION SURGERY  1991  . CARPAL TUNNEL RELEASE  04/06/1998  . CARPAL TUNNEL RELEASE  [id number]   lt hand  . CERVICAL FUSION  01/15/2007  . COLONOSCOPY N/A 05/07/2013   Procedure: COLONOSCOPY;  Surgeon: Danie Binder, MD;  [facility];  Service: Endoscopy;  Laterality: N/A;  8:30 AM  . DEBRIDEMENT TENNIS ELBOW  2003  . PORT-A-CATH REMOVAL  01/16/2011   Procedure: REMOVAL PORT-A-CATH;   Surgeon: Stark Klein, MD;  Location: [place];  Service: General;  Laterality: N/A;  Removal port-a-cath.  Sol Passer PLACEMENT  05/02/2009  . RECTOCELE REPAIR  2012  . rectocele surgery  03/07/10  . TOTAL MASTECTOMY Bilateral 01/13/2015   Procedure: BILATERAL MASTECTOMY;  Surgeon: Stark Klein, MD;  [facility]: [facility];  Service: General;  Laterality: Bilateral;  . trigger thumb surgery  02/01/2010  . VEIN SURGERY  2005 / 2006 / 2014   BOTH LEGS    There were no vitals filed for this visit.      Subjective Assessment - 12/21/15 1207    Subjective Pt has not purchased the McDerma states that she is going to this weekend.  Pt is not consistently completing self scar massage.  States that she can not tell a difference when she does it but feels much better overall.    Pertinent History B masectomy    How long can you sit comfortably? no problem   How long can you stand comfortably? no problem   How long can you walk comfortably? no problem    Currently in Pain? No/denies            [facility] PT Assessment - 12/21/15 0001      Assessment   Medical Diagnosis scar contrature   Referring  Provider Stark Klein    Onset Date/Surgical Date 01/13/15   Next MD Visit None scheduled   Prior Therapy none     Precautions   Precautions None     Restrictions   Weight Bearing Restrictions No     Balance Screen   Has the patient fallen in the past 6 months No   Has the patient had a decrease in activity level because of a fear of falling?  No   Is the patient reluctant to leave their home because of a fear of falling?  No     Prior Function   Vocation Full time employment   Vocation Requirements custodian    Leisure bake      Observation/Other Assessments   Observations hyper kyloid scaring Bilaterally with left greater than right      Posture/Postural Control   Posture Comments --  21cm Rt; 24cm Lt     AROM   Right Shoulder Flexion 165 Degrees  (150 at eval)    Right Shoulder  ABduction 170 Degrees  (120 at eval)   Right Shoulder Internal Rotation 70 Degrees  (55 at eval)   Right Shoulder External Rotation 85 Degrees  (65 at eval)   Left Shoulder Flexion 162 Degrees   Left Shoulder ABduction 170 Degrees  (115 at eval)   Left Shoulder Internal Rotation 70 Degrees   Left Shoulder External Rotation 85 Degrees  (65 at eval)     Strength   Right Shoulder Extension 5/5  (4+/5 at eval) mid trap   Right Shoulder Internal Rotation 5/5  (3+/5 at eval)   Right Shoulder External Rotation 5/5  (4+/5 at eval)   Right Shoulder Horizontal ABduction 5/5   Left Shoulder Extension 5/5  (5/5 at eval) mid trap   Left Shoulder Internal Rotation 5/5  (4+/5 at eval)   Left Shoulder External Rotation 5/5  (5/5 at eval)   Left Shoulder Horizontal ABduction 5/5                     [facility] Adult PT Treatment/Exercise - 12/21/15 0001      Exercises   Exercises Neck;Shoulder     Neck Exercises: Supine   Other Supine Exercise butterfly stretch 30" x5   Other Supine Exercise Bilateral pectoral stretch (supine UE at 90degree abduction in ER with scapular retraction x 5      Modalities   Modalities  Laser done  with manual; ligament and mm stiffness chronic;      Ultrasound   Ultrasound Parameters Laser done to scar 5x on left; 4x on right for 1:24 at 60 J/cm 2.       Manual Therapy   Manual Therapy Myofascial release;Passive ROM   Manual therapy comments completed seperately from all other interventions   Myofascial Release to bilateral scar tissue in pecs and chest into anterior deltoid                  PT Short Term Goals - 12/14/15 1221      PT SHORT TERM GOAL #1   Title Pt flexion to improve Bilaterally to 170 degrees to allow pt to be able to place items into higher cabinets without difficulty    Baseline range nuchanged, but overall function at home is perfomed without limitation.    Time 3   Period Weeks   Status Achieved     PT  SHORT TERM GOAL #2   Title Pt abduction to be increased to 150  degrees bilaterally to allow pt to mop for up to 30 minutes without having to rest    Baseline Has not had to mop for that long since PT began, however performs all mopping pain free at this point (10-15 minutes)    Time 3   Period Weeks   Status Achieved     PT SHORT TERM GOAL #3   Title Pt to be independent in home exercise to allow goals to be met.     Time 3   Period Weeks   Status Achieved           PT Long Term Goals - 12/14/15 1221      PT LONG TERM GOAL #1   Title Pt abduction to be at least 160 degrees bilaterally to allow pt to complete work activity without having to take rest breaks    Time 6   Period Weeks   Status Achieved     PT LONG TERM GOAL #2   Title Pt to be independent in self scar massage for self care at discharge    Baseline Scar massage has beenbenefitital, pt still unabel to perform independnetly. Daughter has been assisting at home 1x weekly.    Time 6   Period Weeks   Status On-going     PT LONG TERM GOAL #3   Title Pt strength to be at least a 4+/5 in bilateral shoulders to allow pt to complete a full day at work without fatigue    Baseline Progress being made. middle lower trap still somewhat weak and limited in range.    Time 6   Period Weeks   Status Partially Met               Plan - 12/21/15 1215    Clinical Impression Statement Began trial of laser today; pt has not obtained McDerma.  Encouraged pt to use McDerma to decrease scar adhesions.  Pt ROM and strength are now functional.  Will continue Laser and manual to decrease hypertrophic scaring and release adherent fascia.     Rehab Potential Good   PT Frequency 3x / week   PT Duration 6 weeks   PT Treatment/Interventions ADLs/Self Care Home Management;Therapeutic activities;Therapeutic exercise;Patient/family education;Manual techniques;Scar mobilization;Passive range of motion;Other (comment)   PT Next Visit Plan  Assess how laser affected tightess and scaring    PT Home Exercise Plan Eval: supine shoulder flexion AAROM c wand; supine shoulder scaption+ER AAROM c wand; seated shoulder GHJ rotation AROM (deferred); 11/8: towel roll stretch, seated scap retraction.: 12/1 added blueTB horizontal abduction, and standing scaption+contrlateral extension   Consulted and Agree with Plan of Care Patient      Patient will benefit from skilled therapeutic intervention in order to improve the following deficits and impairments:  Decreased activity tolerance, Decreased range of motion, Decreased scar mobility, Decreased strength, Increased fascial restricitons, Impaired flexibility  Visit Diagnosis: Other symptoms and signs involving the musculoskeletal system - Plan: PT plan of care cert/re-cert  Stiffness of left shoulder, not elsewhere classified - Plan: PT plan of care cert/re-cert  Stiffness of right shoulder, not elsewhere classified - Plan: PT plan of care cert/re-cert     Problem List Patient Active Problem List   Diagnosis Date Noted  . 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 Family history of colon cancer 09/02/2014  . Personal history of breast cancer 09/02/2014  . Family history of cancer 09/02/2014  . 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  .  Genetic testing 08/30/2014  . Hypothyroidism 11/20/2010  . DM (diabetes mellitus) ([place]) 11/20/2010  . Hot flashes secondary to Arimidex 11/20/2010  . Infiltrating ductal carcinoma of breast [facility]) 09/06/2010    Rayetta Humphrey, PT CLT [phone] 12/21/2015, 12:26 PM  [place] [address] [place], [name], [address] Phone: [phone]   Fax:  [phone]  Name: FEDORA KNISELY MRN: [id number] Date of Birth: [birth year]

## 2015-12-26 ENCOUNTER — Ambulatory Visit (HOSPITAL_COMMUNITY): Payer: BC Managed Care – PPO

## 2015-12-26 DIAGNOSIS — M25611 Stiffness of right shoulder, not elsewhere classified: Secondary | ICD-10-CM

## 2015-12-26 DIAGNOSIS — M25612 Stiffness of left shoulder, not elsewhere classified: Secondary | ICD-10-CM

## 2015-12-26 DIAGNOSIS — R29898 Other symptoms and signs involving the musculoskeletal system: Secondary | ICD-10-CM

## 2015-12-26 NOTE — Therapy (Addendum)
[place], [name], [address] Phone: [phone]   Fax:  [phone]  Physical Therapy Treatment  Patient Details  Name: Kelly Jacobson MRN: [id number] Date of Birth: [birth year] Referring Provider: Stark Klein   Encounter Date: 12/26/2015      PT End of Session - 12/26/15 1105    Visit Number 12   Number of Visits 18   Date for PT Re-Evaluation 12/09/15   Authorization Type BCBS   Authorization - Visit Number 12   Authorization - Number of Visits 18   PT Start Time 0947   PT Stop Time 1107   PT Time Calculation (min) 28 min   Activity Tolerance Patient tolerated treatment well   Behavior During Therapy [facility] for tasks assessed/performed      Past Medical History:  Diagnosis Date  . Anxiety    Xanax  . Arthritis    Hands and legs  . 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 Complication of anesthesia    SLOW TO WAKE UP  . Depression    wellburtin  . Diabetes mellitus   . DM (diabetes mellitus) ([place]) 11/20/2010  . Edema    hands and feet at times, takes as a direutic  . Hot flashes secondary to Arimidex 11/20/2010  . Hyperlipidemia   . Hypothyroidism 11/20/2010   Synthroid  . Thyroid disease   . Varicose veins 2014    Past Surgical History:  Procedure Laterality Date  . ABDOMINAL HYSTERECTOMY  2001   vaginal for endometriosis  . axillary cyst  01/25/2009  . AXILLARY NODE DISSECTION  02/23/2009 and 03/16/2009  . BILATERAL OOPHORECTOMY  04/19/2009  . BREAST REDUCTION SURGERY  1991  . CARPAL TUNNEL RELEASE  04/06/1998  . CARPAL TUNNEL RELEASE  [id number]   lt hand  . CERVICAL FUSION  01/15/2007  . COLONOSCOPY N/A 05/07/2013   Procedure: COLONOSCOPY;  Surgeon: Danie Binder, MD;  [facility];  Service: Endoscopy;  Laterality: N/A;  8:30 AM  . DEBRIDEMENT TENNIS ELBOW  2003  . PORT-A-CATH REMOVAL  01/16/2011   Procedure: REMOVAL PORT-A-CATH;   Surgeon: Stark Klein, MD;  Location: [place];  Service: General;  Laterality: N/A;  Removal port-a-cath.  Sol Passer PLACEMENT  05/02/2009  . RECTOCELE REPAIR  2012  . rectocele surgery  03/07/10  . TOTAL MASTECTOMY Bilateral 01/13/2015   Procedure: BILATERAL MASTECTOMY;  Surgeon: Stark Klein, MD;  [facility];  Service: General;  Laterality: Bilateral;  . trigger thumb surgery  02/01/2010  . VEIN SURGERY  2005 / 2006 / 2014   BOTH LEGS    There were no vitals filed for this visit.      Subjective Assessment - 12/26/15 1042    Subjective Pt doing well today. She offers that HEP and work are [employer] well. She continues to be pain free. She said that she does not notice and difference from laser therapy last session and that she is not interested in trying it again at this point.    Pertinent History B masectomy    Currently in Pain? No/denies                         [facility] Adult PT Treatment/Exercise - 12/26/15 0001      Therapeutic Activites    Therapeutic Activities Work Simulation   Work Simulation UE ergometer with attention to scapular posture and retraction  2x60 fwd; 2x60sec backward     Shoulder Exercises: Supine   Horizontal ABduction 10 reps;Strengthening;Theraband  2x10 with t band    Flexion AAROM;Both;15 reps  15x3secH c blue physio ball     Shoulder Exercises: Standing   Horizontal ABduction Strengthening;10 reps;Theraband  2x10 red TB   Flexion Strengthening;Both;Weights  2x10 shoulder flexion, 3lb weight   Flexion Limitations VC for scapular depression   Row 10 reps  blueTB 2x10, heavy tactile, verbal cues for form      Shoulder Exercises: Stretch   Star Gazer Stretch 1 rep  1x3 minutes on towel roll                  PT Short Term Goals - 12/14/15 1221      PT SHORT TERM GOAL #1   Title Pt flexion to improve Bilaterally to 170 degrees to allow pt to be able to place items into higher cabinets without difficulty    Baseline  range nuchanged, but overall function at home is perfomed without limitation.    Time 3   Period Weeks   Status Achieved     PT SHORT TERM GOAL #2   Title Pt abduction to be increased to 150 degrees bilaterally to allow pt to mop for up to 30 minutes without having to rest    Baseline Has not had to mop for that long since PT began, however performs all mopping pain free at this point (10-15 minutes)    Time 3   Period Weeks   Status Achieved     PT SHORT TERM GOAL #3   Title Pt to be independent in home exercise to allow goals to be met.     Time 3   Period Weeks   Status Achieved           PT Long Term Goals - 12/14/15 1221      PT LONG TERM GOAL #1   Title Pt abduction to be at least 160 degrees bilaterally to allow pt to complete work activity without having to take rest breaks    Time 6   Period Weeks   Status Achieved     PT LONG TERM GOAL #2   Title Pt to be independent in self scar massage for self care at discharge    Baseline Scar massage has beenbenefitital, pt still unabel to perform independnetly. Daughter has been assisting at home 1x weekly.    Time 6   Period Weeks   Status On-going     PT LONG TERM GOAL #3   Title Pt strength to be at least a 4+/5 in bilateral shoulders to allow pt to complete a full day at work without fatigue    Baseline Progress being made. middle lower trap still somewhat weak and limited in range.    Time 6   Period Weeks   Status Partially Met               Plan - 12/26/15 1108    Clinical Impression Statement Pt arrived late for session. Pt expressing disinterest in use of modalities today to address scar tissue compliance, hence session focused on continued use of stretching, and exercises to target scapular diskensis and postural defcitts. Attempted trial of supine on foam roll for pec stretch but pt reporting imediate hamstring cramps trying to get into position and was not wiling additional attempt. Stargazer stretch  appears to cause some discomfort for patient after about 2 miuntes, however when asked, pt  denies any discomfort or pain, offring no explanation as to why she takes a break, and making it difficult to adjust this activity within her tolerance. Most exercises continue to demonstrate a high degree of scapular diskinesis with sustained protraction and minimal retraction even when cued, especialyl during shoulder extension. Perscapular strengthening is performed excessively fast and with poor form/motor control seemingly with intertion and when given tactile and verbal cues for corretion, pt sighs often and rolls her eyes, making difficult to accurately ascertain the patients level of interest in participating today. The patient is minimally engaged in today's session. The pt c/o bilat elbow pain after trialing the UE ergometer and session is then ended a little ealy.    Rehab Potential Good   PT Frequency 3x / week   PT Treatment/Interventions ADLs/Self Care Home Management;Therapeutic activities;Therapeutic exercise;Patient/family education;Manual techniques;Scar mobilization;Passive range of motion;Other (comment)   PT Next Visit Plan Review HEP and potentially consider DC at this time. Pt continues to seem minimally engaged and minimally compliant with home self care/HEP.    PT Home Exercise Plan Eval: supine shoulder flexion AAROM c wand; supine shoulder scaption+ER AAROM c wand; seated shoulder GHJ rotation AROM (deferred); 11/8: towel roll stretch, seated scap retraction.: 12/1 added blueTB horizontal abduction, and standing scaption+contrlateral extension   Consulted and Agree with Plan of Care Patient      Patient will benefit from skilled therapeutic intervention in order to improve the following deficits and impairments:  Decreased activity tolerance, Decreased range of motion, Decreased scar mobility, Decreased strength, Increased fascial restricitons, Impaired flexibility  Visit  Diagnosis: Other symptoms and signs involving the musculoskeletal system  Stiffness of left shoulder, not elsewhere classified  Stiffness of right shoulder, not elsewhere classified     Problem List Patient Active Problem List   Diagnosis Date Noted  . 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 Family history of colon cancer 09/02/2014  . Personal history of breast cancer 09/02/2014  . Family history of cancer 09/02/2014  . 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  . Genetic testing 08/30/2014  . Hypothyroidism 11/20/2010  . DM (diabetes mellitus) ([place]) 11/20/2010  . Hot flashes secondary to Arimidex 11/20/2010  . Infiltrating ductal carcinoma of breast ([place]) 09/06/2010    11:21 AM, 12/26/15 Etta Grandchild, PT, DPT Physical Therapist at [facility] [phone] (office)     [place], [name], [address] Phone: [phone]   Fax:  [phone]  Name: Kelly Jacobson MRN: [id number] Date of Birth: 06/25/1960   09/18/2017  PHYSICAL THERAPY DISCHARGE SUMMARY  Visits from Start of Care: 12  Current functional level related to goals / functional outcomes: See above   Remaining deficits: See above   Education / Equipment: HEP Plan: Patient agrees to discharge.  Patient goals were partially met. Patient is being discharged due to not returning since the last visit.  ?????         Rayetta Humphrey, IXL CLT [phone]

## 2015-12-28 ENCOUNTER — Telehealth (HOSPITAL_COMMUNITY): Payer: Self-pay | Admitting: Physical Therapy

## 2015-12-28 ENCOUNTER — Ambulatory Visit (HOSPITAL_COMMUNITY): Payer: BC Managed Care – PPO | Admitting: Physical Therapy

## 2015-12-28 NOTE — Telephone Encounter (Signed)
Pt did not show for appointment.  Unable to reach or leave message Teena Irani, PTA/CLT [phone]

## 2016-06-05 ENCOUNTER — Telehealth: Payer: Self-pay

## 2016-06-05 NOTE — Telephone Encounter (Signed)
[phone]  Patient due for 3 yr tcs. Has bcbs and no current problems

## 2016-06-07 NOTE — Telephone Encounter (Signed)
Patient called in stating Dr Manuella Ghazi would like for her to have a tcs done every 3 years instead of 10, because she carries the "cancer gene"   Routing to [facility]

## 2016-06-07 NOTE — Telephone Encounter (Signed)
PT's last colonoscopy was 05/07/2013 and Dr. Oneida Alar said the next one due in 05/2023. Please discuss with pt when she returns call.

## 2016-06-11 ENCOUNTER — Ambulatory Visit (HOSPITAL_COMMUNITY): Payer: BC Managed Care – PPO

## 2016-06-11 ENCOUNTER — Encounter: Payer: Self-pay | Admitting: Gastroenterology

## 2016-06-11 ENCOUNTER — Encounter (HOSPITAL_COMMUNITY): Payer: BC Managed Care – PPO | Attending: Oncology | Admitting: Oncology

## 2016-06-11 VITALS — BP 118/65 | HR 96 | Temp 98.8°F | Resp 16 | Ht 70.0 in | Wt 277.5 lb

## 2016-06-11 DIAGNOSIS — Z853 Personal history of malignant neoplasm of breast: Secondary | ICD-10-CM

## 2016-06-11 DIAGNOSIS — C50919 Malignant neoplasm of unspecified site of unspecified female breast: Secondary | ICD-10-CM

## 2016-06-11 NOTE — Patient Instructions (Addendum)
[place] at [facility] Discharge Instructions  RECOMMENDATIONS MADE BY THE CONSULTANT AND ANY TEST RESULTS WILL BE SENT TO YOUR REFERRING PHYSICIAN.  You were seen today by Dr. Talbert Cage. Return in 1 year for follow up.   Thank you for choosing [place] at [facility] to provide your oncology and hematology care.  To afford each patient quality time with our provider, please arrive at least 15 minutes before your scheduled appointment time.    If you have a lab appointment with the [facility] please come in thru the  Main Entrance and check in at the main information desk  You need to re-schedule your appointment should you arrive 10 or more minutes late.  We strive to give you quality time with our providers, and arriving late affects you and other patients whose appointments are after yours.  Also, if you no show three or more times for appointments you may be dismissed from the clinic at the providers discretion.     Again, thank you for choosing [facility].  Our hope is that these requests will decrease the amount of time that you wait before being seen by our physicians.       _____________________________________________________________  Should you have questions after your visit to [facility], please contact our office at [phone] between the hours of 8:30 a.m. and 4:30 p.m.  Voicemails left after 4:30 p.m. will not be returned until the following business day.  For prescription refill requests, have your pharmacy contact our office.       Resources For Cancer Patients and their Caregivers ? American Cancer Society: Can assist with transportation, wigs, general needs, runs Look Good Feel Better.        339-668-2661 ? Cancer Care: Provides financial assistance, online support groups, medication/co-pay assistance.  1-800-813-HOPE 254 710 6598) ? [place] Assists [place]  cancer patients and their families through emotional , educational and financial support.  (984) 129-1558 ? [place] DSS Where to apply for food stamps, Medicaid and utility assistance. 985-740-0256 ? [facility]: Transportation to medical appointments. (629)140-9319 ? Social Security Administration: May apply for disability if have a Stage IV cancer. (762) 182-7239 240-255-4117 ? [employer], Disability and Transit Services: Assists with nutrition, care and transit needs. [place] Support Programs: @10RELATIVEDAYS @ > Cancer Support Group  2nd Tuesday of the month 1pm-2pm, Journey Room  > Creative Journey  3rd Tuesday of the month 1130am-1pm, Journey Room  > Look Good Feel Better  1st Wednesday of the month 10am-12 noon, Journey Room (Call [place] to register [phone])

## 2016-06-11 NOTE — Progress Notes (Signed)
Kelly Redwood, MD [place] [id number]  Oncology History   Stage II (T1c, N1), grade 2 of right breast presenting in upper axilla with mass (actually superior portion of right arm).  1.9 cm mass with 3/26 positive nodes with extracapsular extension.  Both mets in lymph nodes were greater than 2 mm.  Negative margins.  Initial biopsy on 01/25/2009 with definitive surgery and lymph node dissection on 02/23/2009 followed by re-excision on 03/16/2009 with all margins clear.  ER 52%, PR 36%, Ki-67 33% Her2 negative.  S/P EC in dose dense fashion x 4 cycles followed by weekly Taxol.  S/P radiation Finishing in December 2011.  Now on Arimidex starting on 12/22/2009.     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  ER 62%, PR 36%, Ki-67 33%, Her 2 negative      02/23/2009 Surgery    Right breast lumpectomy with sentinel node and axillary dissection ([place]) demonstrating 1.0 cm invasive tumor, a 0.3 cm DCIS involving deep margin, + LVI, 3/26 positive lymph nodes      02/23/2009 Pathologic Stage    pT1c, pN1a: Stage IIA      03/16/2009 Surgery    Re-excision of margins with negative disease      04/06/2009 Echocardiogram    EF 55-60%      04/19/2009 Surgery    Bilateral salpingo-oopherectomy: Benign. (L) periadnexal soft tissue with endometriosis & endosalpingiosis. (L) benign papillary cyst adenofibroma. (R) paratubal soft tissue with endosalpingiosis & benign cystic follicles/glandular inclusions.       05/10/2009 - 06/23/2009 Chemotherapy    Epirubicin, Cytoxan x 4 cycles      07/06/2009 - 09/21/2009 Chemotherapy    Taxol x 12 cycles      10/17/2009 - 12/07/2009 Radiation Therapy    Adjuvant RT with Dr. Sondra Come at [facility]       12/22/2009 - 06/22/2014 Anti-estrogen  oral therapy    Anastrazole x 5 years      12/25/2013 Pathology Results    Breast Cancer Index- 5.9% risk of distant recurrence for ER+, lymph node negative patients after 5 years giving her a low likelihood of benefit from extended endocine therapy.      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 Genes analyzed: APC, ATM, BARD1, BMPR1A, BRCA1, BRCA2, BRIP1, CDH1, CDK4, CDKN2A, CHEK2, MLH1, MRE11A, MSH2, MSH6, MUTYH, NBN, NF1, PALB2, PMS2, POLD1, POLE, PTEN, RAD50, RAD51C, RAD51D, SMAD4, SMARCA4A, STK11, & TP53.       12/27/2014 Imaging    DEXA scan: Normal. T-score -0.4      01/13/2015 Surgery    Bilateral mastectomies (Dr. Barry Dienes): Right breast-Benign breast tissue, 0/1 LNs.  Enlarged (L) axillary LN negative. 0/5 additional LNs negative.  No malignancy identified .       CURRENT THERAPY: Observation  INTERVAL HISTORY: Kelly Jacobson 56 y.o. female returns for  regular  visit for followup of stage II right breast She is here alone today. She has completed 5 years of endocrine therapy. She has undergone bilateral mastectomies with Dr. Barry Dienes.  Kelly Jacobson is unaccompanied. She presents for follow up. She has not palpated any masses on her chest wall or any axillary lymphadenopathy. Patient states that she has  not been able to lose any weight on her own and is going to look into weight reduction surgery. She states that she has any emotional eater and has trouble with weight control on her own and her PCP Dr. Brigitte Pulse wants her to look into weight reduction surgery.  Past Medical History:  Diagnosis Date  . Anxiety    Xanax  . Arthritis    Hands and legs  . Breast cancer ([place]) 2011   right IDC+DCIS; ER/PR+, Her2-, ki67=33%; right lumpectomy  . Complication of anesthesia    SLOW TO WAKE UP  . Depression    well[place]urtin  . Diabetes mellitus   . DM (diabetes mellitus) ([place]) 11/20/2010  . Edema      hands and feet at times, takes as a direutic  . Hot flashes secondary to Arimidex 11/20/2010  . Hyperlipidemia   . Hypothyroidism 11/20/2010   Synthroid  . Thyroid disease   . 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     is allergic to tape and propoxyphene.  Kelly Jacobson had no medications administered during this visit.  Past Surgical History:  Procedure Laterality Date  . ABDOMINAL HYSTERECTOMY  2001   vaginal for endometriosis  . axillary cyst  01/25/2009  . AXILLARY NODE DISSECTION  02/23/2009 and 03/16/2009  . BILATERAL OOPHORECTOMY  04/19/2009  . BREAST REDUCTION SURGERY  1991  . CARPAL TUNNEL RELEASE  04/06/1998  . CARPAL TUNNEL RELEASE  [id number]   lt hand  . CERVICAL FUSION  01/15/2007  . COLONOSCOPY N/A 05/07/2013   Procedure: COLONOSCOPY;  Surgeon: Danie Binder, MD;  Location: [facility];  Service: Endoscopy;  Laterality: N/A;  8:30 AM  . DEBRIDEMENT TENNIS ELBOW  2003  . PORT-A-CATH REMOVAL  01/16/2011   Procedure: REMOVAL PORT-A-CATH;  Surgeon: Stark Klein, MD;  Location: [facility];  Service: General;  Laterality: N/A;  Removal port-a-cath.  Sol Passer PLACEMENT  05/02/2009  . RECTOCELE REPAIR  2012  . rectocele surgery  03/07/10  . TOTAL MASTECTOMY Bilateral 01/13/2015   Procedure: BILATERAL MASTECTOMY;  Surgeon: Stark Klein, MD;  Location: [facility];  Service: General;  Laterality: Bilateral;  . trigger thumb surgery  02/01/2010  . VEIN SURGERY  2005 / 2006 / 2014   BOTH LEGS    Denies any headaches, dizziness, double vision, fevers, chills, nausea, vomiting, diarrhea, constipation, chest pain, heart palpitations,  shortness of breath, blood in stool, black tarry stool, urinary pain, urinary burning, urinary frequency, hematuria. Positive for night sweats.    Hot flashes    PHYSICAL EXAMINATION  ECOG PERFORMANCE STATUS: 0 - Asymptomatic  Vitals:   06/11/16 1344  BP: 118/65  Pulse: 96  Resp: 16  Temp: 98.8 F (37.1 C)   Physical Exam  Constitutional: She is oriented to person, place, and time and well-developed, well-nourished, and in no distress. No distress.  HENT:  Head: Normocephalic and atraumatic.  Mouth/Throat: No oropharyngeal exudate.  Eyes: Conjunctivae are normal. Pupils are equal, round, and reactive to light. No scleral icterus.  Neck: Normal range of motion. Neck supple. No JVD present.  Cardiovascular: Normal rate, regular rhythm and normal heart sounds.  Exam reveals no gallop and no friction rub.  No murmur heard. Pulmonary/Chest: Breath sounds normal. No respiratory distress. She has no wheezes. She has no rales.  CHEST WALL: Bilateral mastectomies with well healed surgical scars, no subcutaneous nodules or skin changes on chest wall. No axillary lymphadenopathy bilaterally.  Abdominal: Soft. Bowel sounds are normal. She exhibits no distension. There is no tenderness. There is no guarding.  Musculoskeletal: She exhibits no edema or tenderness.  Lymphadenopathy:    She has no cervical adenopathy.  Neurological: She is alert and oriented to person, place, and time. No cranial nerve deficit.  Skin: Skin is warm and dry. No rash noted. No erythema. No pallor.  Psychiatric: Affect and judgment normal.      LABORATORY DATA: I have reviewed the data as listed. CBC    Component Value Date/Time   WBC 10.1 01/15/2015 0531   RBC 4.53 01/15/2015 0531   HGB 12.4 01/15/2015 0531   HCT 40.8 01/15/2015 0531   PLT 256 01/15/2015 0531   MCV 90.1 01/15/2015 0531   MCH 27.4 01/15/2015 0531   MCHC 30.4 01/15/2015 0531   RDW 13.9 01/15/2015 0531   LYMPHSABS 2.1 12/07/2014 1322    MONOABS 0.5 12/07/2014 1322   EOSABS 0.2 12/07/2014 1322   BASOSABS 0.0 12/07/2014 1322      Chemistry      Component Value Date/Time   NA 138 01/15/2015 0531   K 3.9 01/15/2015 0531   CL 100 (L) 01/15/2015 0531   CO2 31 01/15/2015 0531   BUN 21 (H) 01/15/2015 0531   CREATININE 0.61 01/15/2015 0531      Component Value Date/Time   CALCIUM 8.9 01/15/2015 0531   CALCIUM 9.3 12/22/2013 0957   ALKPHOS 95 12/07/2014 1322   AST 33 12/07/2014 1322   ALT 29 12/07/2014 1322   BILITOT 0.9 12/07/2014 1322       RADIOGRAPHIC STUDIES: I have personally reviewed the radiological images as listed and agreed with the findings in the report.  Study Result     CLINICAL DATA: History of right breast cancer. Patient is status post lumpectomy and reduction mammoplasty. She is planning on bilateral mastectomy.  EXAM: DIGITAL DIAGNOSTIC BILATERAL MAMMOGRAM WITH CAD  COMPARISON: Previous exam(s).  ACR Breast Density Category b: There are scattered areas of fibroglandular density.  FINDINGS: There are no suspicious masses, areas of nonsurgical architectural distortion or microcalcifications in either breast. Bilateral postsurgical changes are stable.  Mammographic images were processed with CAD.  IMPRESSION: No mammographic evidence of malignancy in either breast.  RECOMMENDATION: Screening mammogram in one year, if the patient does not have a mastectomy.(Code:[id number]-B-01Y)  I have discussed the findings and recommendations with the patient. Results were also provided in writing at the conclusion of the visit. If applicable, a reminder letter will be sent to the patient regarding the next appointment.  BI-RADS CATEGORY 2: Benign Finding(s)   Electronically Signed  By: Fidela Salisbury M.D.  On: 12/28/2014 10:34       ASSESSMENT:  1. Stage II right breast cancer R breast 2. Obesity 3. Normal bone density on 12/17/2014. 4. Fatty infiltration of liver    5. CHEK 2 mutation 6. Bilateral mastectomies  Patient Active Problem List   Diagnosis Date Noted  . 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 Family history of colon cancer 09/02/2014  . Personal history of breast cancer 09/02/2014  . Family history of cancer 09/02/2014  . 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  . Genetic testing 08/30/2014  . Hypothyroidism 11/20/2010  .  DM (diabetes mellitus) ([place]) 11/20/2010  . Hot flashes secondary to Arimidex 11/20/2010  . 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   -Patient is concerned about her risk for colon cancer since she has the CHEK2 gene mutation. I have advised her that with the CHEK 2 gene mutation, a woman who has already had breast cancer has a high risk of developing a second breast cancer within next 5-25 years, and since she has undergone bilateral mastectomies she has significantly reduced her risk. CHEK 2 mutations have been linked to colon cancers and the general recommendation is for colonoscopies every 5 years, I will discuss this with her gastroenterologist Dr. Oneida Alar.   -RTC in 1 year for routine follow up. Sooner should she palpate any masses on her chest wall or axillary lymphadenopathy.   This note was electronically signed. Twana First, MD  06/11/2016

## 2016-06-11 NOTE — Telephone Encounter (Signed)
I called the patient, no answer, lmom  

## 2016-06-11 NOTE — Telephone Encounter (Signed)
SHE HAD A COLONOSCOPY APR 2015. Next colonoscopy APR 2020.  ===View-only below this line===  ----- Message ----- From: Twana First, MD Sent: 06/11/2016   3:21 PM To: Danie Binder, MD  Lovett Sox, I saw our mutual patient Kelly Jacobson today for her breast cancer. She has the CHEK 2 gene mutation which puts her at increased risk for colon cancer. Currently our guildelines recommend colonoscopies every 5 years for patients with this mutation. I just wanted to get your thoughts on this because the patient is very concerned about her cancer risk and was asking when she should get her colonoscopy done.  Thanks! Barbaraann Share

## 2016-06-11 NOTE — Telephone Encounter (Addendum)
PLEASE CALL PT. IN OUR RECORDS WE HAVE ONLY A PATERNAL GRANDFATHER WITH COLON CANCER BUT MULTIPLE RELATIVES WITH CANCER.  SHE HAD HER LAST TCS IN 2015 AND SHE DID NOT HAVE ANY POLYPS. CURRENTLY SHE HAS NO NEED FOR A COLONOSCOPY EVERY 3 YEARS. IF SHE WANTS TO REDUCE HER RISK OF COLON CANCER SHE SHOULD GET HER BODY MASS INDEX < 30. IN 2017 SHE WAS 263 LBS WITH A BMI 37.

## 2016-06-12 ENCOUNTER — Telehealth: Payer: Self-pay

## 2016-06-12 NOTE — Telephone Encounter (Signed)
Kelly Jacobson said to please make sure pt's next colonoscopy is nic'd for 04/2018 and not 2025. See phone note of 06/05/2016.  Thanks!

## 2016-06-13 NOTE — Telephone Encounter (Signed)
CHANGED HER RECALL TO CORRECT DATE

## 2017-05-31 ENCOUNTER — Encounter: Payer: Self-pay | Admitting: Hematology

## 2017-05-31 LAB — IFOBT (OCCULT BLOOD): IFOBT: POSITIVE

## 2017-06-11 ENCOUNTER — Other Ambulatory Visit: Payer: Self-pay | Admitting: Internal Medicine

## 2017-06-11 DIAGNOSIS — M5416 Radiculopathy, lumbar region: Secondary | ICD-10-CM

## 2017-06-21 ENCOUNTER — Encounter (HOSPITAL_COMMUNITY): Payer: Self-pay | Admitting: Internal Medicine

## 2017-06-21 ENCOUNTER — Inpatient Hospital Stay (HOSPITAL_COMMUNITY): Payer: BC Managed Care – PPO | Attending: Internal Medicine | Admitting: Internal Medicine

## 2017-06-21 VITALS — BP 131/72 | HR 106 | Temp 98.7°F | Resp 18 | Wt 279.9 lb

## 2017-06-21 DIAGNOSIS — C50911 Malignant neoplasm of unspecified site of right female breast: Secondary | ICD-10-CM

## 2017-06-21 DIAGNOSIS — E039 Hypothyroidism, unspecified: Secondary | ICD-10-CM | POA: Diagnosis not present

## 2017-06-21 DIAGNOSIS — Z853 Personal history of malignant neoplasm of breast: Secondary | ICD-10-CM | POA: Insufficient documentation

## 2017-06-21 NOTE — Patient Instructions (Signed)
[facility] at [facility] Discharge Instructions  You saw Dr. Higgs today.   Thank you for choosing [facility] at [facility] to provide your oncology and hematology care.  To afford each patient quality time with our provider, please arrive at least 15 minutes before your scheduled appointment time.   If you have a lab appointment with the Cancer Center please come in thru the  Main Entrance and check in at the main information desk  You need to re-schedule your appointment should you arrive 10 or more minutes late.  We strive to give you quality time with our providers, and arriving late affects you and other patients whose appointments are after yours.  Also, if you no show three or more times for appointments you may be dismissed from the clinic at the providers discretion.     Again, thank you for choosing [facility].  Our hope is that these requests will decrease the amount of time that you wait before being seen by our physicians.       _____________________________________________________________  Should you have questions after your visit to [facility], please contact our office at [phone] between the hours of 8:30 a.m. and 4:30 p.m.  Voicemails left after 4:30 p.m. will not be returned until the following business day.  For prescription refill requests, have your pharmacy contact our office.       Resources For Cancer Patients and their Caregivers ? American Cancer Society: Can assist with transportation, wigs, general needs, runs Look Good Feel Better.        1-888-227-6333 ? Cancer Care: Provides financial assistance, online support groups, medication/co-pay assistance.  1-800-813-HOPE (4673) ? [facility] Assists [place] cancer patients and their families through emotional , educational and financial support.  336-427-4357 ? [place] DSS Where to apply for food  stamps, Medicaid and utility assistance. 336-342-1394 ? [facility]: Transportation to medical appointments. 336-347-2287 ? Social Security Administration: May apply for disability if have a Stage IV cancer. 336-342-7796 1-800-772-1213 ? [place] Aging, Disability and Transit Services: Assists with nutrition, care and transit needs. 336-349-2343  Cancer Center Support Programs:   > Cancer Support Group  2nd Tuesday of the month 1pm-2pm, Journey Room   > Creative Journey  3rd Tuesday of the month 1130am-1pm, Journey Room     

## 2017-06-21 NOTE — Progress Notes (Signed)
Diagnosis No diagnosis found.  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  T1C N1 right breast cancer.  Pt is here today for yearly follow-up.  She reports she is no longer taking Arimidex or Femara.  She is now being seen yearly.  Review of outside labs done By her PCP on 05/31/2017 showed WBC 8.92, HB 14 and plts 228,000.  Chemistries WNL with normal LFTs and Cr 0.7.  She will RTC in 1 year for follow-up.    2  URI symptoms.  She reports dry cough.  She is afebrile in clinic.  She denies smoking.   She was given the option of CXR today but did not desire to have that performed.  I discussed with her to try OTC Zyrtec and Mucinex and if no Improvement to notify the office or PCP.    3  Hypothyroidism.  She is on Synthroid.  Follow-up with PCP.    4.  CHEK- 2 Mutation.  Follow-up with GI as recommended.    5.  Health maintenance.  Continue Dexa and GI follow-up as recommended.    Current Status:  Pt is seen today for follow-up.  She is complaining of a cough that started 2 weeks ago.    Oncology History   Stage II (T1c, N1), grade 2 of right breast presenting in upper axilla with mass (actually superior portion of right arm).  1.9 cm mass with 3/26 positive nodes with extracapsular extension.  Both mets in lymph nodes were greater than 2 mm.  Negative margins.  Initial biopsy on 01/25/2009 with definitive surgery and lymph node dissection on 02/23/2009 followed by re-excision on 03/16/2009 with all margins clear.  ER 52%, PR 36%, Ki-67 33% Her2 negative.  S/P EC in dose dense fashion x 4 cycles followed by weekly Taxol.  S/P radiation Finishing in December 2011.  Now on Arimidex starting on 12/22/2009.     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  ER 62%, PR 36%, Ki-67 33%, Her 2 negative      02/23/2009 Surgery    Right breast lumpectomy with sentinel node and axillary dissection ([place]) demonstrating 1.0 cm invasive tumor, a 0.3 cm DCIS involving deep margin, + LVI, 3/26 positive lymph nodes      02/23/2009 Pathologic Stage    pT1c, pN1a: Stage IIA      03/16/2009 Surgery    Re-excision of margins with negative disease      04/06/2009 Echocardiogram    EF 55-60%      04/19/2009 Surgery    Bilateral salpingo-oopherectomy: Benign. (L) periadnexal soft tissue with endometriosis & endosalpingiosis. (L) benign papillary cyst adenofibroma. (R) paratubal soft tissue with endosalpingiosis & benign cystic follicles/glandular inclusions.       05/10/2009 - 06/23/2009 Chemotherapy    Epirubicin, Cytoxan x 4 cycles      07/06/2009 - 09/21/2009 Chemotherapy    Taxol x 12 cycles      10/17/2009 - 12/07/2009 Radiation Therapy    Adjuvant RT with Dr. Sondra Come at [facility]       12/22/2009 - 06/22/2014 Anti-estrogen oral therapy    Anastrazole x 5  years      12/25/2013 Pathology Results    Breast Cancer Index- 5.9% risk of distant recurrence for ER+, lymph node negative patients after 5 years giving her a low likelihood of benefit from extended endocine therapy.      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 Genes analyzed: APC, ATM, BARD1, BMPR1A, BRCA1, BRCA2, BRIP1, CDH1, CDK4, CDKN2A, CHEK2, MLH1, MRE11A, MSH2, MSH6, MUTYH, NBN, NF1, PALB2, PMS2, POLD1, POLE, PTEN, RAD50, RAD51C, RAD51D, SMAD4, SMARCA4A, STK11, & TP53.       12/27/2014 Imaging    DEXA scan: Normal. T-score -0.4      01/13/2015 Surgery    Bilateral mastectomies (Dr. Barry Dienes): Right breast-Benign breast tissue, 0/1 LNs.   Enlarged (L) axillary LN negative. 0/5 additional LNs negative.  No malignancy identified .        Problem List Patient Active Problem List   Diagnosis Date Noted  . 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 Family history of colon cancer [Z80.0] 09/02/2014  . Personal history of breast cancer [Z85.3] 09/02/2014  . Family history of cancer [Z80.9] 09/02/2014  . 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 Genetic testing [Z13.79] 08/30/2014  . Hypothyroidism [E03.9] 11/20/2010  . DM (diabetes mellitus) ([place]) [E11.9] 11/20/2010  . Hot flashes secondary to Arimidex [R23.2] 11/20/2010  . Infiltrating ductal carcinoma of breast ([place]) [C50.919] 09/06/2010    Past Medical History Past Medical History:  Diagnosis Date  . Anxiety    Xanax  . Arthritis    Hands and legs  . Breast cancer ([place]) 2011   right IDC+DCIS; ER/PR+, Her2-, ki67=33%; right lumpectomy  . Complication of anesthesia    SLOW TO WAKE UP  . Depression    [place]  . Diabetes mellitus   . DM (diabetes mellitus) ([place]) 11/20/2010  . Edema    hands and feet at times, takes as a direutic  . Hot flashes secondary to Arimidex 11/20/2010  . Hyperlipidemia   . Hypothyroidism 11/20/2010   Synthroid  . Thyroid disease   . Varicose veins 2014    Past Surgical History Past Surgical History:  Procedure Laterality Date  . ABDOMINAL HYSTERECTOMY  2001   vaginal for endometriosis  . axillary cyst  01/25/2009  . AXILLARY NODE DISSECTION  02/23/2009 and 03/16/2009  . BILATERAL OOPHORECTOMY  04/19/2009  . BREAST REDUCTION SURGERY  1991  . CARPAL TUNNEL RELEASE  04/06/1998  . CARPAL TUNNEL RELEASE  [id number]   lt hand  . CERVICAL FUSION  01/15/2007  . COLONOSCOPY N/A 05/07/2013   Procedure: COLONOSCOPY;  Surgeon: Danie Binder, MD;  Location: [facility];  Service: Endoscopy;  Laterality: N/A;  8:30 AM  . DEBRIDEMENT TENNIS ELBOW  2003  . PORT-A-CATH  REMOVAL  01/16/2011   Procedure: REMOVAL PORT-A-CATH;  Surgeon: Stark Klein, MD;  Location: [facility];  Service: General;  Laterality: N/A;  Removal port-a-cath.  Sol Passer PLACEMENT  05/02/2009  . RECTOCELE REPAIR  2012  . rectocele surgery  03/07/10  . TOTAL MASTECTOMY Bilateral 01/13/2015   Procedure: BILATERAL MASTECTOMY;  Surgeon: Stark Klein, MD;  Location: [facility];  Service: General;  Laterality: Bilateral;  . trigger thumb surgery  02/01/2010  . VEIN SURGERY  2005 / 2006 / 2014   BOTH LEGS    Family History Family History  Problem Relation Age of Onset  .  Melanoma Father        dx. 1s  . Heart attack Maternal Uncle   . Other Paternal Aunt        hysterectomy  . Diabetes Paternal Aunt   . Cancer Paternal Aunt        unknown type  . Colon cancer Paternal Grandmother        dx. 50s-60s  . Heart attack Paternal Grandfather   . Throat cancer Cousin 78  . Prostate cancer Cousin 102  . Kidney cancer Paternal Aunt        dx. late 50s-early 60s  . Pancreatic cancer Other        (x2 paternal great aunts)  . Brain cancer Other        unknown tumor type  . Anesthesia problems Neg Hx   . Hypotension Neg Hx   . Malignant hyperthermia Neg Hx   . Pseudochol deficiency Neg Hx      Social History  reports that she has never smoked. She has never used smokeless tobacco. She reports that she drinks alcohol. She reports that she does not use drugs.  Medications  Current Outpatient Medications:  .  ALPRAZolam (XANAX) 0.25 MG tablet, Take 0.25 mg by mouth every 8 (eight) hours as needed for anxiety. , Disp: , Rfl:  .  aspirin 81 MG tablet, Take 81 mg by mouth daily., Disp: , Rfl:  .  atorvastatin (LIPITOR) 20 MG tablet, Take 20 mg by mouth daily., Disp: , Rfl:  .  buPROPion (WELLBUTRIN SR) 200 MG 12 hr tablet, Take 200 mg by mouth daily. , Disp: , Rfl:  .  empagliflozin (JARDIANCE) 25 MG TABS tablet, Take 25 mg by mouth daily., Disp: , Rfl:  .  ferrous sulfate 325 (65 FE) MG tablet, Take  325 mg by mouth daily with breakfast., Disp: , Rfl:  .  HYDROcodone-acetaminophen (NORCO/VICODIN) 5-325 MG per tablet, Take 1 tablet by mouth every 6 (six) hours as needed. pain, Disp: , Rfl:  .  ibuprofen (ADVIL,MOTRIN) 800 MG tablet, Take 800 mg by mouth every 8 (eight) hours as needed (pain). pain, Disp: , Rfl:  .  Insulin Degludec (TRESIBA FLEXTOUCH) 200 UNIT/ML SOPN, Inject 200 Units into the skin., Disp: , Rfl:  .  levothyroxine (SYNTHROID, LEVOTHROID) 50 MCG tablet, Take 50 mcg by mouth daily.  , Disp: , Rfl:  .  Liraglutide (VICTOZA) 18 MG/3ML SOLN, Inject 1.8 mLs into the skin daily. , Disp: , Rfl:  .  loratadine (CLARITIN) 10 MG tablet, Take 10 mg by mouth daily., Disp: , Rfl:  .  metFORMIN (GLUCOPHAGE) 1000 MG tablet, Take 1,000 mg by mouth daily with breakfast. , Disp: , Rfl:  .  Multiple Vitamin (MULTIVITAMIN WITH MINERALS) TABS tablet, Take 1 tablet by mouth daily., Disp: , Rfl:  .  telmisartan (MICARDIS) 40 MG tablet, Take 40 mg by mouth daily., Disp: , Rfl:  .  venlafaxine (EFFEXOR) 37.5 MG tablet, Take 37.5 mg by mouth daily., Disp: , Rfl:  .  vitamin E 1000 UNIT capsule, Take 1,000 Units by mouth daily., Disp: , Rfl:  .  zolpidem (AMBIEN CR) 12.5 MG CR tablet, Take 12.5 mg by mouth at bedtime as needed for sleep. , Disp: , Rfl:  No current facility-administered medications for this visit.   Facility-Administered Medications Ordered in Other Visits:  .  sodium chloride 0.9 % injection 10 mL, 10 mL, Intravenous, PRN, [facility], PA-C, 10 mL at 11/20/10 1326  Allergies Tape and Propoxyphene  Review of Systems Review of Systems - Oncology ROS as per HPI otherwise 12 point ROS is negative.   Physical Exam  Vitals Wt Readings from Last 3 Encounters:  06/21/17 279 lb 14.4 oz (127 kg)  06/11/16 277 lb 8 oz (125.9 kg)  06/07/15 273 lb (123.8 kg)   Temp Readings from Last 3 Encounters:  06/21/17 98.7 F (37.1 C) (Oral)  06/11/16 98.8 F (37.1 C) (Oral)  06/07/15  98.2 F (36.8 C)   BP Readings from Last 3 Encounters:  06/21/17 131/72  06/11/16 118/65  06/07/15 (!) 115/57   Pulse Readings from Last 3 Encounters:  06/21/17 (!) 106  06/11/16 96  06/07/15 85    Constitutional: Well-developed, well-nourished, and in no distress.   HENT: Head: Normocephalic and atraumatic.  Mouth/Throat: No oropharyngeal exudate. Mucosa moist. Eyes: Pupils are equal, round, and reactive to light. Conjunctivae are normal. No scleral icterus.  Neck: Normal range of motion. Neck supple. No JVD present.  Cardiovascular: Normal rate, regular rhythm and normal heart sounds.  Exam reveals no gallop and no friction rub.   No murmur heard. Pulmonary/Chest: Effort normal and breath sounds normal. No respiratory distress. No wheezes.No rales.  Abdominal: Soft. Bowel sounds are normal. No distension. There is no tenderness. There is no guarding.  Musculoskeletal: No edema or tenderness.  Lymphadenopathy: No cervical, axillary or supraclavicular adenopathy.  Neurological: Alert and oriented to person, place, and time. No cranial nerve deficit.  Skin: Skin is warm and dry. No rash noted. No erythema. No pallor.  Psychiatric: Affect and judgment normal.  Bilateral breast exam:  Chaperone present.  Bilateral mastectomy.  No palpable evidence of chest wall recurrence.    Labs No visits with results within 3 Day(s) from this visit.  Latest known visit with results is:  Admission on 01/13/2015, Discharged on 01/15/2015  Component Date Value Ref Range Status  . Glucose-Capillary 01/13/2015 140* 65 - 99 mg/dL Final  . Comment 1 01/13/2015 Notify RN   Final  . Glucose-Capillary 01/13/2015 180* 65 - 99 mg/dL Final  . Comment 1 01/13/2015 Notify RN   Final  . Comment 2 01/13/2015 Document in Chart   Final  . WBC 01/14/2015 14.4* 4.0 - 10.5 K/uL Final  . RBC 01/14/2015 4.57  3.87 - 5.11 MIL/uL Final  . Hemoglobin 01/14/2015 12.7  12.0 - 15.0 g/dL Final  . HCT 01/14/2015 41.0   36.0 - 46.0 % Final  . MCV 01/14/2015 89.7  78.0 - 100.0 fL Final  . MCH 01/14/2015 27.8  26.0 - 34.0 pg Final  . MCHC 01/14/2015 31.0  30.0 - 36.0 g/dL Final  . RDW 01/14/2015 14.0  11.5 - 15.5 % Final  . Platelets 01/14/2015 290  150 - 400 K/uL Final  . Sodium 01/14/2015 136  135 - 145 mmol/L Final  . Potassium 01/14/2015 4.9  3.5 - 5.1 mmol/L Final  . Chloride 01/14/2015 99* 101 - 111 mmol/L Final  . CO2 01/14/2015 31  22 - 32 mmol/L Final  . Glucose, Bld 01/14/2015 255* 65 - 99 mg/dL Final  . BUN 01/14/2015 14  6 - 20 mg/dL Final  . Creatinine, Ser 01/14/2015 0.76  0.44 - 1.00 mg/dL Final  . Calcium 01/14/2015 9.1  8.9 - 10.3 mg/dL Final  . GFR calc non Af Amer 01/14/2015 >60  >60 mL/min Final  . GFR calc Af Amer 01/14/2015 >60  >60 mL/min Final   Comment: (NOTE) The eGFR has been calculated using the CKD EPI equation. This  calculation has not been validated in all clinical situations. eGFR's persistently <60 mL/min signify possible Chronic Kidney Disease.   . Anion gap 01/14/2015 6  5 - 15 Final  . Glucose-Capillary 01/13/2015 153* 65 - 99 mg/dL Final  . Glucose-Capillary 01/14/2015 200* 65 - 99 mg/dL Final  . Glucose-Capillary 01/14/2015 153* 65 - 99 mg/dL Final  . WBC 01/15/2015 10.1  4.0 - 10.5 K/uL Final  . RBC 01/15/2015 4.53  3.87 - 5.11 MIL/uL Final  . Hemoglobin 01/15/2015 12.4  12.0 - 15.0 g/dL Final  . HCT 01/15/2015 40.8  36.0 - 46.0 % Final  . MCV 01/15/2015 90.1  78.0 - 100.0 fL Final  . MCH 01/15/2015 27.4  26.0 - 34.0 pg Final  . MCHC 01/15/2015 30.4  30.0 - 36.0 g/dL Final  . RDW 01/15/2015 13.9  11.5 - 15.5 % Final  . Platelets 01/15/2015 256  150 - 400 K/uL Final  . Sodium 01/15/2015 138  135 - 145 mmol/L Final  . Potassium 01/15/2015 3.9  3.5 - 5.1 mmol/L Final   Comment: DELTA CHECK NOTED REPEATED TO VERIFY   . Chloride 01/15/2015 100* 101 - 111 mmol/L Final  . CO2 01/15/2015 31  22 - 32 mmol/L Final  . Glucose, Bld 01/15/2015 197* 65 - 99 mg/dL  Final  . BUN 01/15/2015 21* 6 - 20 mg/dL Final  . Creatinine, Ser 01/15/2015 0.61  0.44 - 1.00 mg/dL Final  . Calcium 01/15/2015 8.9  8.9 - 10.3 mg/dL Final  . GFR calc non Af Amer 01/15/2015 >60  >60 mL/min Final  . GFR calc Af Amer 01/15/2015 >60  >60 mL/min Final   Comment: (NOTE) The eGFR has been calculated using the CKD EPI equation. This calculation has not been validated in all clinical situations. eGFR's persistently <60 mL/min signify possible Chronic Kidney Disease.   . Anion gap 01/15/2015 7  5 - 15 Final  . Glucose-Capillary 01/14/2015 159* 65 - 99 mg/dL Final  . Glucose-Capillary 01/14/2015 186* 65 - 99 mg/dL Final  . Glucose-Capillary 01/15/2015 163* 65 - 99 mg/dL Final  . Glucose-Capillary 01/15/2015 178* 65 - 99 mg/dL Final  . Glucose-Capillary 01/15/2015 179* 65 - 99 mg/dL Final     Pathology No orders of the defined types were placed in this encounter.      Zoila Shutter MD

## 2017-06-22 ENCOUNTER — Ambulatory Visit
Admission: RE | Admit: 2017-06-22 | Discharge: 2017-06-22 | Disposition: A | Discharge status: Home / Self Care | Payer: BC Managed Care – PPO | Source: Ambulatory Visit | Attending: Internal Medicine | Admitting: Internal Medicine

## 2017-06-22 DIAGNOSIS — M5416 Radiculopathy, lumbar region: Secondary | ICD-10-CM

## 2018-03-19 ENCOUNTER — Encounter: Payer: Self-pay | Admitting: Gastroenterology

## 2018-06-23 ENCOUNTER — Other Ambulatory Visit: Payer: Self-pay

## 2018-06-24 ENCOUNTER — Other Ambulatory Visit: Payer: Self-pay

## 2018-06-24 ENCOUNTER — Inpatient Hospital Stay (HOSPITAL_COMMUNITY): Payer: BC Managed Care – PPO | Attending: Hematology | Admitting: Hematology

## 2018-06-24 DIAGNOSIS — C50919 Malignant neoplasm of unspecified site of unspecified female breast: Secondary | ICD-10-CM

## 2018-06-24 DIAGNOSIS — Z853 Personal history of malignant neoplasm of breast: Secondary | ICD-10-CM | POA: Diagnosis not present

## 2018-06-24 NOTE — Progress Notes (Signed)
[name], [place] [address]   CLINIC:  Medical Oncology/Hematology  PCP:  [name], [name] [address] [phone]   REASON FOR VISIT:  Follow-up for Breast Cancer  CURRENT THERAPY: Clinical Surveillance   BRIEF ONCOLOGIC HISTORY:  Oncology History Overview Note  Stage II (T1c, N1), grade 2 of right breast presenting in upper axilla with mass (actually superior portion of right arm).  1.9 cm mass with 3/26 positive nodes with extracapsular extension.  Both mets in lymph nodes were greater than 2 mm.  Negative margins.  Initial biopsy on 01/25/2009 with definitive surgery and lymph node dissection on 02/23/2009 followed by re-excision on 03/16/2009 with all margins clear.  ER 52%, PR 36%, Ki-67 33% Her2 negative.  S/P EC in dose dense fashion x 4 cycles followed by weekly Taxol.  S/P radiation Finishing in December 2011.  Now on Arimidex starting on 12/22/2009.   Infiltrating ductal carcinoma of breast ([facility])  01/18/2009 Initial Diagnosis   Simple excision of right axilla demonstrating high grade DCIS and invasive moderately  differentiated ductal carcinoma with positive rection margins.  ER 62%, PR 36%, Ki-67 33%, Her 2 negative   02/23/2009 Surgery   Right breast lumpectomy with sentinel node and axillary dissection ([place]) demonstrating 1.0 cm invasive tumor, a 0.3 cm DCIS involving deep margin, + LVI, 3/26 positive lymph nodes   02/23/2009 Pathologic Stage   pT1c, pN1a: Stage IIA   03/16/2009 Surgery   Re-excision of margins with negative disease   04/06/2009 Echocardiogram   EF 55-60%   04/19/2009 Surgery   Bilateral salpingo-oopherectomy: Benign. (L) periadnexal soft tissue with endometriosis & endosalpingiosis. (L) benign papillary cyst adenofibroma. (R) paratubal soft tissue with endosalpingiosis & benign cystic follicles/glandular inclusions.    05/10/2009 - 06/23/2009 Chemotherapy   Epirubicin, Cytoxan x 4 cycles    07/06/2009 - 09/21/2009 Chemotherapy   Taxol x 12 cycles   10/17/2009 - 12/07/2009 Radiation Therapy   Adjuvant RT with Dr. Sondra [facility]    12/22/2009 - 06/22/2014 Anti-estrogen oral therapy   Anastrazole x 5 years   12/25/2013 Pathology Results   Breast Cancer Index- 5.9% risk of distant recurrence for ER+, lymph node negative patients after 5 years giving her a low likelihood of benefit from extended endocine therapy.   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 Genes analyzed: APC, ATM, BARD1, BMPR1A, BRCA1, BRCA2, BRIP1, CDH1, CDK4, CDKN2A, CHEK2, MLH1, MRE11A, MSH2, MSH6, MUTYH, NBN, NF1, PALB2, PMS2, POLD1, POLE, PTEN, RAD50, RAD51C, RAD51D, SMAD4, SMARCA4A, STK11, & TP53.    12/27/2014 Imaging   DEXA scan: Normal. T-score -0.4   01/13/2015 Surgery   Bilateral mastectomies (Dr. Barry Dienes): Right breast-Benign breast tissue, 0/1 LNs.  Enlarged (L) axillary LN negative. 0/5 additional LNs negative.  No malignancy identified .      CANCER STAGING: Cancer Staging Infiltrating ductal carcinoma of breast ([facility]) Staging form: Breast, AJCC 7th Edition - Clinical: T1c - Unsigned - Pathologic: Stage IIA (T1c, N1a, cM0) - Signed by Stark Klein, MD on 12/26/2010    INTERVAL HISTORY:  Kelly Jacobson 58 y.o. female presents today for follow-up.  Reports overall doing well.  She denies any significant fatigue.  She denies any changes to her mastectomy sites.  She denies any abdominal pain, change in bowel habits, or weight loss.  She denies any new cough.  Denies any fevers, chills,  night sweats.  No changes in her health history since her last visit.    REVIEW OF SYSTEMS:  Review of Systems  Constitutional: Negative.   HENT:  Negative.   Eyes: Negative.   Respiratory: Negative.   Cardiovascular: Negative.   Gastrointestinal: Negative.   Endocrine: Negative.   Genitourinary:  Negative.    Musculoskeletal: Negative.   Skin: Negative.   Neurological: Negative.   Hematological: Negative.   Psychiatric/Behavioral: Negative.      PAST MEDICAL/SURGICAL HISTORY:  Past Medical History:  Diagnosis Date  . Anxiety    Xanax  . Arthritis    Hands and legs  . Breast cancer ([facility]) 2011   right IDC+DCIS; ER/PR+, Her2-, ki67=33%; right lumpectomy  . Complication of anesthesia    SLOW TO WAKE UP  . Depression    wellburtin  . Diabetes mellitus   . DM (diabetes mellitus) ([facility]) 11/20/2010  . Edema    hands and feet at times, takes as a direutic  . Hot flashes secondary to Arimidex 11/20/2010  . Hyperlipidemia   . Hypothyroidism 11/20/2010   Synthroid  . Thyroid disease   . Varicose veins 2014   Past Surgical History:  Procedure Laterality Date  . ABDOMINAL HYSTERECTOMY  2001   vaginal for endometriosis  . axillary cyst  01/25/2009  . AXILLARY NODE DISSECTION  02/23/2009 and 03/16/2009  . BILATERAL OOPHORECTOMY  04/19/2009  . BREAST REDUCTION SURGERY  1991  . CARPAL TUNNEL RELEASE  04/06/1998  . CARPAL TUNNEL RELEASE  [id number]   lt hand  . CERVICAL FUSION  01/15/2007  . COLONOSCOPY N/A 05/07/2013   Procedure: COLONOSCOPY;  Surgeon: Danie Binder, MD;  [facility];  Service: Endoscopy;  Laterality: N/A;  8:30 AM  . DEBRIDEMENT TENNIS ELBOW  2003  . PORT-A-CATH REMOVAL  01/16/2011   Procedure: REMOVAL PORT-A-CATH;  Surgeon: Stark Klein, MD;  Location: [facility];  Service: General;  Laterality: N/A;  Removal port-a-cath.  Sol Passer PLACEMENT  05/02/2009  . RECTOCELE REPAIR  2012  . rectocele surgery  03/07/10  . TOTAL MASTECTOMY Bilateral 01/13/2015   Procedure: BILATERAL MASTECTOMY;  Surgeon: Stark Klein, MD;  Location: [facility];  Service: General;  Laterality: Bilateral;  . trigger thumb surgery  02/01/2010  . VEIN SURGERY  2005 / 2006 / 2014   BOTH LEGS     SOCIAL HISTORY:  Social History   Socioeconomic History  . Marital status: Single    Spouse  name: Not on file  . Number of children: Not on file  . Years of education: Not on file  . Highest education level: Not on file  Occupational History  . Not on file  Social Needs  . Financial resource strain: Not on file  . Food insecurity    Worry: Not on file    Inability: Not on file  . Transportation needs    Medical: Not on file    Non-medical: Not on file  Tobacco Use  . Smoking status: Never Smoker  . Smokeless tobacco: Never Used  Substance and Sexual Activity  . Alcohol use: Yes    Comment: Occasional - maybe 4 drinks per year  . Drug use: No  . Sexual activity: Not on file  Lifestyle  . Physical activity    Days per week: Not on file    Minutes per session: Not on file  . Stress: Not on file  Relationships  . Social connections    Talks on phone: Not on file  Gets together: Not on file    Attends religious service: Not on file    Active member of club or organization: Not on file    Attends meetings of clubs or organizations: Not on file    Relationship status: Not on file  . Intimate partner violence    Fear of current or ex partner: Not on file    Emotionally abused: Not on file    Physically abused: Not on file    Forced sexual activity: Not on file  Other Topics Concern  . Not on file  Social History Narrative  . Not on file    FAMILY HISTORY:  Family History  Problem Relation Age of Onset  . Melanoma Father        dx. 73s  . Heart attack Maternal Uncle   . Other Paternal Aunt        hysterectomy  . Diabetes Paternal Aunt   . Cancer Paternal Aunt        unknown type  . Colon cancer Paternal Grandmother        dx. 50s-60s  . Heart attack Paternal Grandfather   . Throat cancer Cousin 42  . Prostate cancer Cousin 54  . Kidney cancer Paternal Aunt        dx. late 50s-early 60s  . Pancreatic cancer Other        (x2 paternal great aunts)  . Brain cancer Other        unknown tumor type  . Anesthesia problems Neg Hx   . Hypotension Neg Hx    . Malignant hyperthermia Neg Hx   . Pseudochol deficiency Neg Hx     CURRENT MEDICATIONS:  Outpatient Encounter Medications as of 06/24/2018  Medication Sig Note  . ALPRAZolam (XANAX) 0.25 MG tablet Take 0.25 mg by mouth every 8 (eight) hours as needed for anxiety.    Marland Kitchen aspirin 81 MG tablet Take 81 mg by mouth daily.   Marland Kitchen atorvastatin (LIPITOR) 20 MG tablet Take 20 mg by mouth daily.   Marland Kitchen buPROPion (WELLBUTRIN SR) 200 MG 12 hr tablet Take 200 mg by mouth daily.    . empagliflozin (JARDIANCE) 25 MG TABS tablet Take 25 mg by mouth daily.   . ferrous sulfate 325 (65 FE) MG tablet Take 325 mg by mouth daily with breakfast.   . HYDROcodone-acetaminophen (NORCO/VICODIN) 5-325 MG per tablet Take 1 tablet by mouth every 6 (six) hours as needed. pain   . ibuprofen (ADVIL,MOTRIN) 800 MG tablet Take 800 mg by mouth every 8 (eight) hours as needed (pain). pain   . Insulin Degludec (TRESIBA FLEXTOUCH) 200 UNIT/ML SOPN Inject 200 Units into the skin.   Marland Kitchen levothyroxine (SYNTHROID, LEVOTHROID) 50 MCG tablet Take 50 mcg by mouth daily.     Marland Kitchen loratadine (CLARITIN) 10 MG tablet Take 10 mg by mouth daily.   . metFORMIN (GLUCOPHAGE) 1000 MG tablet Take 1,000 mg by mouth daily with breakfast.    . Multiple Vitamin (MULTIVITAMIN WITH MINERALS) TABS tablet Take 1 tablet by mouth daily.   Marland Kitchen telmisartan (MICARDIS) 40 MG tablet Take 40 mg by mouth daily.   Marland Kitchen venlafaxine (EFFEXOR) 37.5 MG tablet Take 37.5 mg by mouth daily.   . vitamin E 1000 UNIT capsule Take 1,000 Units by mouth daily.   Marland Kitchen zolpidem (AMBIEN CR) 12.5 MG CR tablet Take 12.5 mg by mouth at bedtime as needed for sleep.  12/29/2014: ..  . [DISCONTINUED] Liraglutide (VICTOZA) 18 MG/3ML SOLN Inject 1.8 mLs into the skin  daily.     Facility-Administered Encounter Medications as of 06/24/2018  Medication  . sodium chloride 0.9 % injection 10 mL    ALLERGIES:  Allergies  Allergen Reactions  . Tape Hives  . Propoxyphene Other (See Comments)     hallucination     PHYSICAL EXAM:  ECOG Performance status: 1  Vitals:   06/24/18 1500  BP: 136/69  Pulse: 86  Resp: 16  Temp: 97.8 F (36.6 C)  SpO2: 96%   Filed Weights   06/24/18 1500  Weight: 284 lb 3 oz (128.9 kg)    Physical Exam Constitutional:      Appearance: Normal appearance. She is obese.  HENT:     Head: Normocephalic.     Nose: Nose normal.     Mouth/Throat:     Mouth: Mucous membranes are moist.     Pharynx: Oropharynx is clear.  Eyes:     Extraocular Movements: Extraocular movements intact.     Conjunctiva/sclera: Conjunctivae normal.  Neck:     Musculoskeletal: Normal range of motion.  Cardiovascular:     Rate and Rhythm: Normal rate and regular rhythm.     Pulses: Normal pulses.     Heart sounds: Normal heart sounds.  Pulmonary:     Effort: Pulmonary effort is normal.     Breath sounds: Normal breath sounds.  Abdominal:     General: Bowel sounds are normal.     Palpations: Abdomen is soft.  Musculoskeletal: Normal range of motion.  Skin:    General: Skin is warm and dry.  Neurological:     General: No focal deficit present.     Mental Status: She is alert and oriented to person, place, and time.  Psychiatric:        Mood and Affect: Mood normal.        Behavior: Behavior normal.        Thought Content: Thought content normal.        Judgment: Judgment normal.      LABORATORY DATA:  I have reviewed the labs as listed.  CBC    Component Value Date/Time   WBC 10.1 01/15/2015 0531   RBC 4.53 01/15/2015 0531   HGB 12.4 01/15/2015 0531   HCT 40.8 01/15/2015 0531   PLT 256 01/15/2015 0531   MCV 90.1 01/15/2015 0531   MCH 27.4 01/15/2015 0531   MCHC 30.4 01/15/2015 0531   RDW 13.9 01/15/2015 0531   LYMPHSABS 2.1 12/07/2014 1322   MONOABS 0.5 12/07/2014 1322   EOSABS 0.2 12/07/2014 1322   BASOSABS 0.0 12/07/2014 1322   CMP Latest Ref Rng & Units 01/15/2015 01/14/2015 01/07/2015  Glucose 65 - 99 mg/dL 197(H) 255(H) 170(H)  BUN 6 - 20  mg/dL 21(H) 14 18  Creatinine 0.44 - 1.00 mg/dL 0.61 0.76 0.59  Sodium 135 - 145 mmol/L 138 136 140  Potassium 3.5 - 5.1 mmol/L 3.9 4.9 4.0  Chloride 101 - 111 mmol/L 100(L) 99(L) 103  CO2 22 - 32 mmol/L '31 31 30  ' Calcium 8.9 - 10.3 mg/dL 8.9 9.1 9.4  Total Protein 6.5 - 8.1 g/dL - - -  Total Bilirubin 0.3 - 1.2 mg/dL - - -  Alkaline Phos 38 - 126 U/L - - -  AST 15 - 41 U/L - - -  ALT 14 - 54 U/L - - -       DIAGNOSTIC IMAGING:  I have independently reviewed the scans and discussed with the patient.   I have reviewed Francene Finders, NP's  note and agree with the documentation.  I personally performed a face-to-face visit, made revisions and my assessment and plan is as follows.    ASSESSMENT & PLAN:   Infiltrating ductal carcinoma of breast ([facility]) 1. Stage II (T1c, N1) Right Breast Cancer -- Grade 2 of Right breast presenting in upper axilla with mass (actually superior portion of right arm).  1.9 cm mass with 3/26 positive nodes with extracapsular extension.  Both mets in lymph nodes were greater than 2 mm.  Negative margins.  Initial biopsy on 01/25/2009 with definitive surgery and lymph node dissection on 02/23/2009 followed by re-excision on 03/16/2009 with all margins clear.  ER 52%, PR 36%, Ki-67 33% Her2 negative.  S/P EC in dose dense fashion x 4 cycles followed by weekly Taxol.  S/P radiation Finishing in December 2011.  Arimidex starting on 12/22/2009. 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   - 01/2015: She went on to have bilateral mastectomies without reconstruction. - Physical exam was negative for any evidence of disease. Patient is doing well.  - RTC in 1 year or sooner if needed.    2. Health Maintenance: - Will refer patient for colonoscopy as it has been 5 years.       Orders placed this encounter:  Orders Placed This Encounter  Procedures  . CBC with Differential  . Comprehensive metabolic panel      Roger Shelter, [place] [phone]

## 2018-06-24 NOTE — Assessment & Plan Note (Addendum)
1. Stage II (T1c, N1) Right Breast Cancer -- Grade 2 of Right breast presenting in upper axilla with mass (actually superior portion of right arm).  1.9 cm mass with 3/26 positive nodes with extracapsular extension.  Both mets in lymph nodes were greater than 2 mm.  Negative margins.  Initial biopsy on 01/25/2009 with definitive surgery and lymph node dissection on 02/23/2009 followed by re-excision on 03/16/2009 with all margins clear.  ER 52%, PR 36%, Ki-67 33% Her2 negative.  S/P EC in dose dense fashion x 4 cycles followed by weekly Taxol.  S/P radiation Finishing in December 2011.  Arimidex starting on 12/22/2009. 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   - 01/2015: She went on to have bilateral mastectomies without reconstruction. - Physical exam was negative for any evidence of disease. Patient is doing well.  - RTC in 1 year or sooner if needed.    2. Health Maintenance: - Will refer patient for colonoscopy as it has been 5 years.  

## 2018-06-26 ENCOUNTER — Encounter: Payer: Self-pay | Admitting: Gastroenterology

## 2018-07-08 NOTE — Progress Notes (Addendum)
REVIEWED-NO ADDITIONAL RECOMMENDATIONS.  Referring Provider: [name], MD Primary Care Physician:  [name], MD  Primary GI: Dr. Oneida Alar  Chief Complaint  Patient presents with  . Colonoscopy    due for tcs    HPI:   Kelly Jacobson is a 58 y.o. female presenting today with a history significant for breast cancer with CHEK 2 gene mutation, diabetes, hyperlipidemia, and hypothyroidism,  presenting today to schedule her colonoscopy.   Last colonoscopy 05/07/2013 with mild diverticulosis in ascending colon, descending colon and sigmoid colon, otherwise normal colon. Small internal hemorrhoids. Recommended to repeat in 10 years. However, with history of breast cancer diagnosis and CHEK 2 gene mutation increasing her risk of colon cancer, it is recommended she have surveillance colonoscopy every 5 years.   No concerns at this time. Denies upper GI symptoms including reflux, heartburn, dysphagia, nausea, vomiting, upper abdominal pain. Denies lower GI symptoms including abdominal pain, constipation, diarrhea, straining with BMs, hematochezia. Stools are dark with iron.    Past Medical History:  Diagnosis Date  . Anxiety    Xanax  . Arthritis    Hands and legs  . Breast cancer ([facility]) 2011   right IDC+DCIS; ER/PR+, Her2-, ki67=33%; right lumpectomy  . Complication of anesthesia    SLOW TO WAKE UP  . Depression    wellburtin  . Diabetes mellitus   . DM (diabetes mellitus) ([facility]) 11/20/2010  . Edema    hands and feet at times, takes as a direutic  . Hot flashes secondary to Arimidex 11/20/2010  . Hyperlipidemia   . Hypothyroidism 11/20/2010   Synthroid  . Thyroid disease   . Varicose veins 2014    Past Surgical History:  Procedure Laterality Date  . ABDOMINAL HYSTERECTOMY  2001   vaginal for endometriosis  . axillary cyst  01/25/2009  . AXILLARY NODE DISSECTION  02/23/2009 and 03/16/2009  . BILATERAL OOPHORECTOMY  04/19/2009  . BREAST REDUCTION SURGERY  1991  . CARPAL  TUNNEL RELEASE  04/06/1998  . CARPAL TUNNEL RELEASE  [id number]   lt hand  . CERVICAL FUSION  01/15/2007  . COLONOSCOPY N/A 05/07/2013   Procedure: COLONOSCOPY;  Surgeon: Danie Binder, MD;  [facility];  Service: Endoscopy;  Laterality: N/A;  8:30 AM  . DEBRIDEMENT TENNIS ELBOW  2003  . PORT-A-CATH REMOVAL  01/16/2011   Procedure: REMOVAL PORT-A-CATH;  Surgeon: Stark Klein, MD;  Location: [facility];  Service: General;  Laterality: N/A;  Removal port-a-cath.  Sol Passer PLACEMENT  05/02/2009  . RECTOCELE REPAIR  2012  . rectocele surgery  03/07/10  . TOTAL MASTECTOMY Bilateral 01/13/2015   Procedure: BILATERAL MASTECTOMY;  Surgeon: Stark Klein, MD;  [facility];  Service: General;  Laterality: Bilateral;  . trigger thumb surgery  02/01/2010  . VEIN SURGERY  2005 / 2006 / 2014   BOTH LEGS    Current Outpatient Medications  Medication Sig Dispense Refill  . ALPRAZolam (XANAX) 0.25 MG tablet Take 0.25 mg by mouth every 8 (eight) hours as needed for anxiety.     Marland Kitchen aspirin 81 MG tablet Take 81 mg by mouth daily.    Marland Kitchen atorvastatin (LIPITOR) 20 MG tablet Take 20 mg by mouth daily.    Marland Kitchen buPROPion (WELLBUTRIN XL) 300 MG 24 hr tablet Take 300 mg by mouth daily.    . Calcium Carbonate-Vitamin D (OSCAL 500/200 D-3 PO) Take by mouth daily.    . Cholecalciferol (VITAMIN D) 50 MCG (2000 UT) CAPS Take by mouth daily.    Marland Kitchen  empagliflozin (JARDIANCE) 25 MG TABS tablet Take 25 mg by mouth daily.     . ferrous sulfate 325 (65 FE) MG tablet Take 325 mg by mouth daily with breakfast.    . HYDROcodone-acetaminophen (NORCO/VICODIN) 5-325 MG per tablet Take 1 tablet by mouth every 6 (six) hours as needed. pain    . ibuprofen (ADVIL,MOTRIN) 800 MG tablet Take 800 mg by mouth every 8 (eight) hours as needed (pain). pain    . Insulin Degludec (TRESIBA FLEXTOUCH) 200 UNIT/ML SOPN Inject 40 Units into the skin.     Marland Kitchen levothyroxine (SYNTHROID, LEVOTHROID) 50 MCG tablet Take 50 mcg by mouth daily.      . metFORMIN  (GLUCOPHAGE) 1000 MG tablet Take 1,000 mg by mouth daily with breakfast.     . Multiple Vitamin (MULTIVITAMIN WITH MINERALS) TABS tablet Take 1 tablet by mouth daily.    Marland Kitchen telmisartan (MICARDIS) 40 MG tablet Take 40 mg by mouth daily.    Marland Kitchen venlafaxine (EFFEXOR) 37.5 MG tablet Take 37.5 mg by mouth daily.    . vitamin E 400 UNIT capsule Take 400 Units by mouth daily.    Marland Kitchen zolpidem (AMBIEN CR) 12.5 MG CR tablet Take 12.5 mg by mouth at bedtime as needed for sleep.     . polyethylene glycol-electrolytes (NULYTELY/GOLYTELY) 420 g solution Take 4,000 mLs by mouth once for 1 dose. 4000 mL 0   No current facility-administered medications for this visit.    Facility-Administered Medications Ordered in Other Visits  Medication Dose Route Frequency Provider Last Rate Last Dose  . sodium chloride 0.9 % injection 10 mL  10 mL Intravenous PRN B[facility] Cancer, PA-C   10 mL at 11/20/10 1326    Allergies as of 07/09/2018 - Review Complete 07/09/2018  Allergen Reaction Noted  . Tape Hives 01/05/2011  . Propoxyphene Other (See Comments) 12/29/2014    Family History  Problem Relation Age of Onset  . Melanoma Father        dx. 19s  . Heart attack Maternal Uncle   . Other Paternal Aunt        hysterectomy  . Diabetes Paternal Aunt   . Cancer Paternal Aunt        unknown type  . Colon cancer Paternal Grandmother        dx. 50s-60s  . Heart attack Paternal Grandfather   . Throat cancer Cousin 39  . Prostate cancer Cousin 83  . Kidney cancer Paternal Aunt        dx. late 50s-early 60s  . Pancreatic cancer Other        (x2 paternal great aunts)  . Brain cancer Other        unknown tumor type  . Anesthesia problems Neg Hx   . Hypotension Neg Hx   . Malignant hyperthermia Neg Hx   . Pseudochol deficiency Neg Hx     Social History   Socioeconomic History  . Marital status: Single    Spouse name: Not on file  . Number of children: Not on file  . Years of education: Not on file  . Highest  education level: Not on file  Occupational History  . Not on file  Social Needs  . Financial resource strain: Not on file  . Food insecurity    Worry: Not on file    Inability: Not on file  . Transportation needs    Medical: Not on file    Non-medical: Not on file  Tobacco Use  . Smoking status:  Never Smoker  . Smokeless tobacco: Never Used  Substance and Sexual Activity  . Alcohol use: Yes    Comment: Occasional - maybe 4 drinks per year  . Drug use: No  . Sexual activity: Not on file  Lifestyle  . Physical activity    Days per week: Not on file    Minutes per session: Not on file  . Stress: Not on file  Relationships  . Social Herbalist on phone: Not on file    Gets together: Not on file    Attends religious service: Not on file    Active member of club or organization: Not on file    Attends meetings of clubs or organizations: Not on file    Relationship status: Not on file  Other Topics Concern  . Not on file  Social History Narrative  . Not on file    Review of Systems: Gen: Denies fever, chills, fatigue, lightheadedness, dizziness, anorexia. CV: Denies chest pain, palpitations, syncope, peripheral edema Resp: Denies dyspnea at rest, cough, wheezing GI: See HPI Derm: Denies rash, itching, dry skin Psych: Denies depression, anxiety.  Heme: Denies bruising, bleeding  Physical Exam: BP 122/76   Pulse 81   Temp 97.8 F (36.6 C) (Oral)   Ht _0  (1.778 m)   Wt 278 lb (126.1 kg)   BMI 39.89 kg/m  General:   Alert and oriented. No distress noted. Pleasant and cooperative.  Head:  Normocephalic and atraumatic. Eyes:  Conjuctiva clear without scleral icterus. Heart:  S1, S2 present without murmurs appreciated. Lungs:  Clear to auscultation bilaterally. No wheezes, rales, or rhonchi. No distress.  Abdomen:  +BS, soft, non-tender and non-distended. No rebound or guarding. No HSM or masses noted. Small bruising noted from insulin injections.  Msk:   Symmetrical without gross deformities. Normal posture. Extremities:  Without edema. Neurologic:  Alert and  oriented x4 Psych:  Alert and cooperative. Normal mood and affect.

## 2018-07-09 ENCOUNTER — Encounter: Payer: Self-pay | Admitting: Gastroenterology

## 2018-07-09 ENCOUNTER — Other Ambulatory Visit: Payer: Self-pay

## 2018-07-09 ENCOUNTER — Telehealth: Payer: Self-pay | Admitting: Gastroenterology

## 2018-07-09 ENCOUNTER — Encounter: Payer: Self-pay | Admitting: *Deleted

## 2018-07-09 ENCOUNTER — Other Ambulatory Visit: Payer: Self-pay | Admitting: *Deleted

## 2018-07-09 ENCOUNTER — Ambulatory Visit: Payer: BC Managed Care – PPO | Admitting: Gastroenterology

## 2018-07-09 DIAGNOSIS — Z1501 Genetic susceptibility to malignant neoplasm of breast: Secondary | ICD-10-CM

## 2018-07-09 DIAGNOSIS — Z1211 Encounter for screening for malignant neoplasm of colon: Secondary | ICD-10-CM | POA: Diagnosis not present

## 2018-07-09 DIAGNOSIS — Z800 Family history of malignant neoplasm of digestive organs: Secondary | ICD-10-CM

## 2018-07-09 MED ORDER — PEG 3350-KCL-NA BICARB-NACL 420 G PO SOLR: 4000.0000 mL | Freq: Once | ORAL | 0 refills | Status: AC

## 2018-07-09 NOTE — Patient Instructions (Addendum)
1. We will get you scheduled for a colonoscopy with Dr. Oneida Alar in the near future.     Hold iron for 7 days prior to procedure.    Diabetes Medication Adjustments for Procedure   Day Before Procedure    1. Continue Jardiance and Metformin as normal    2. 1/2 dose insulin degludec    Day of Procedure    No diabetes medication the morning of the procedure  Continue monitoring blood sugar closely during this time. If you feel your sugars are getting low, correct with clear liquids. Call if you have questions or concerns.   2. We will see you back in the office as needed. Do not hesitate to call if you have concerns.

## 2018-07-09 NOTE — Telephone Encounter (Signed)
Kelly Jacobson, if patient doesn't return call with updated medication list today, can you call her tomorrow? She reported starting a new diabetes medication that she takes once a week, but couldn't remember the name of it.

## 2018-07-09 NOTE — Assessment & Plan Note (Signed)
58 y.o. 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 Last colonoscopy in 2015 with diverticulosis and internal hemorrhoids, otherwise normal. Recommended repeat in 10 years. However, due to patients history of breast cancer with CHEK 2 mutation, patient is at increased risk of colon cancer and need surveillance every 5 years. No upper or lower GI symptoms at this time. Stool is dark on iron. Positive iFOBT noted in 2019. No hematochezia. Paternal grandmother had colon cancer. Other cancers noted in several family members.   Proceed with TCS with propofol with Dr. Oneida Alar in the near future. The risks, benefits, and alternatives have been discussed in detail with patient. They have stated understanding and desire to proceed.  I have requested TCS on Thursday or Friday as patient has new weekly insulin injection on Mondays. Patient is to call back with this medication as she couldn't remember the name.  See separate instructions for medication adjustments including diabetes medications and iron.

## 2018-07-09 NOTE — Progress Notes (Signed)
cc'ed to pcp °

## 2018-07-10 NOTE — Telephone Encounter (Signed)
LMOM for pt to call with the name of the medication.

## 2018-07-14 NOTE — Telephone Encounter (Signed)
Pt said she takes Ozempic 0.5 mg this month once weekly on Monday. It will be increased next month.  I have added it to the medication list.  Forwarding to Aliene Altes, PA.

## 2018-07-14 NOTE — Telephone Encounter (Signed)
Noted  

## 2018-07-29 ENCOUNTER — Ambulatory Visit (HOSPITAL_COMMUNITY): Payer: BC Managed Care – PPO | Admitting: Hematology

## 2018-09-01 ENCOUNTER — Ambulatory Visit: Payer: BC Managed Care – PPO | Admitting: Gastroenterology

## 2018-09-29 ENCOUNTER — Encounter (HOSPITAL_COMMUNITY): Payer: Self-pay

## 2018-09-29 NOTE — Patient Instructions (Signed)
Your procedure is scheduled on: 10/07/2018  Report to [facility] at   11:15  AM.  Call this number if you have problems the morning of surgery: [phone]   Remember:              Follow Directions on the letter you received from Your Physician's office regarding the Bowel Prep  :  Take these medicines the morning of surgery with A SIP OF WATER: Xanax, Welbutrin, Effexor, and Levothroxine              No Insulin am of procedure   Do not wear jewelry, make-up or nail polish.    Do not bring valuables to the hospital.  Contacts, dentures or bridgework may not be worn into surgery.  .   Patients discharged the day of surgery will not be allowed to drive home.     Colonoscopy, Adult, Care After This sheet gives you information about how to care for yourself after your procedure. Your health care provider may also give you more specific instructions. If you have problems or questions, contact your health care provider. What can I expect after the procedure? After the procedure, it is common to have:  A small amount of blood in your stool for 24 hours after the procedure.  Some gas.  Mild abdominal cramping or bloating.  Follow these instructions at home: General instructions   For the first 24 hours after the procedure: ? Do not drive or use machinery. ? Do not sign important documents. ? Do not drink alcohol. ? Do your regular daily activities at a slower pace than normal. ? Eat soft, easy-to-digest foods. ? Rest often.  Take over-the-counter or prescription medicines only as told by your health care provider.  It is up to you to get the results of your procedure. Ask your health care provider, or the department performing the procedure, when your results will be ready. Relieving cramping and bloating  Try walking around when you have cramps or feel bloated.  Apply heat to your abdomen as told by your health care provider. Use a heat source that your health care  provider recommends, such as a moist heat pack or a heating pad. ? Place a towel between your skin and the heat source. ? Leave the heat on for 20-30 minutes. ? Remove the heat if your skin turns bright red. This is especially important if you are unable to feel pain, heat, or cold. You may have a greater risk of getting burned. Eating and drinking  Drink enough fluid to keep your urine clear or pale yellow.  Resume your normal diet as instructed by your health care provider. Avoid heavy or fried foods that are hard to digest.  Avoid drinking alcohol for as long as instructed by your health care provider. Contact a health care provider if:  You have blood in your stool 2-3 days after the procedure. Get help right away if:  You have more than a small spotting of blood in your stool.  You pass large blood clots in your stool.  Your abdomen is swollen.  You have nausea or vomiting.  You have a fever.  You have increasing abdominal pain that is not relieved with medicine. This information is not intended to replace advice given to you by your health care provider. Make sure you discuss any questions you have with your health care provider. Document Released: 08/09/2003 Document Revised: 09/19/2015 Document Reviewed: 03/08/2015 Elsevier Interactive Patient Education  2018  [employer].

## 2018-10-03 ENCOUNTER — Other Ambulatory Visit (HOSPITAL_COMMUNITY)
Admission: RE | Admit: 2018-10-03 | Discharge: 2018-10-03 | Disposition: A | Discharge status: Home / Self Care | Payer: BC Managed Care – PPO | Source: Ambulatory Visit | Attending: Gastroenterology | Admitting: Gastroenterology

## 2018-10-03 ENCOUNTER — Encounter (HOSPITAL_COMMUNITY): Payer: Self-pay

## 2018-10-03 ENCOUNTER — Encounter (HOSPITAL_COMMUNITY)
Admission: RE | Admit: 2018-10-03 | Discharge: 2018-10-03 | Disposition: A | Discharge status: Home / Self Care | Payer: BC Managed Care – PPO | Source: Ambulatory Visit | Attending: Gastroenterology | Admitting: Gastroenterology

## 2018-10-03 ENCOUNTER — Other Ambulatory Visit: Payer: Self-pay

## 2018-10-03 DIAGNOSIS — Z01812 Encounter for preprocedural laboratory examination: Secondary | ICD-10-CM | POA: Insufficient documentation

## 2018-10-03 DIAGNOSIS — Z20828 Contact with and (suspected) exposure to other viral communicable diseases: Secondary | ICD-10-CM | POA: Insufficient documentation

## 2018-10-03 LAB — BASIC METABOLIC PANEL
Anion gap: 12 (ref 5–15)
BUN: 23 mg/dL — ABNORMAL HIGH (ref 6–20)
CO2: 24 mmol/L (ref 22–32)
Calcium: 9.1 mg/dL (ref 8.9–10.3)
Chloride: 104 mmol/L (ref 98–111)
Creatinine, Ser: 0.68 mg/dL (ref 0.44–1.00)
GFR calc Af Amer: >60 mL/min (ref >60)
GFR calc non Af Amer: >60 mL/min (ref >60)
Glucose, Bld: 131 mg/dL — ABNORMAL HIGH (ref 70–99)
Potassium: 3.8 mmol/L (ref 3.5–5.1)
Sodium: 140 mmol/L (ref 135–145)

## 2018-10-03 LAB — SARS CORONAVIRUS 2 (TAT 6-24 HRS): SARS Coronavirus 2: NEGATIVE

## 2018-10-06 NOTE — Progress Notes (Signed)
CC'D TO PCP °

## 2018-10-06 NOTE — Progress Notes (Signed)
cc'd to pcp 

## 2018-10-07 ENCOUNTER — Ambulatory Visit (HOSPITAL_COMMUNITY): Payer: BC Managed Care – PPO | Admitting: Anesthesiology

## 2018-10-07 ENCOUNTER — Encounter (HOSPITAL_COMMUNITY)
Admission: RE | Disposition: A | Discharge status: Home / Self Care | Payer: Self-pay | Source: Home / Self Care | Attending: Gastroenterology

## 2018-10-07 ENCOUNTER — Other Ambulatory Visit: Payer: Self-pay

## 2018-10-07 ENCOUNTER — Encounter (HOSPITAL_COMMUNITY): Payer: Self-pay | Admitting: *Deleted

## 2018-10-07 ENCOUNTER — Ambulatory Visit (HOSPITAL_COMMUNITY)
Admission: RE | Admit: 2018-10-07 | Discharge: 2018-10-07 | Disposition: A | Discharge status: Home / Self Care | Payer: BC Managed Care – PPO | Attending: Gastroenterology | Admitting: Gastroenterology

## 2018-10-07 DIAGNOSIS — Q438 Other specified congenital malformations of intestine: Secondary | ICD-10-CM | POA: Insufficient documentation

## 2018-10-07 DIAGNOSIS — Z9013 Acquired absence of bilateral breasts and nipples: Secondary | ICD-10-CM | POA: Insufficient documentation

## 2018-10-07 DIAGNOSIS — E109 Type 1 diabetes mellitus without complications: Secondary | ICD-10-CM | POA: Insufficient documentation

## 2018-10-07 DIAGNOSIS — K644 Residual hemorrhoidal skin tags: Secondary | ICD-10-CM | POA: Diagnosis not present

## 2018-10-07 DIAGNOSIS — F329 Major depressive disorder, single episode, unspecified: Secondary | ICD-10-CM | POA: Insufficient documentation

## 2018-10-07 DIAGNOSIS — M19041 Primary osteoarthritis, right hand: Secondary | ICD-10-CM | POA: Diagnosis not present

## 2018-10-07 DIAGNOSIS — F419 Anxiety disorder, unspecified: Secondary | ICD-10-CM | POA: Diagnosis not present

## 2018-10-07 DIAGNOSIS — Q998 Other specified chromosome abnormalities: Secondary | ICD-10-CM | POA: Insufficient documentation

## 2018-10-07 DIAGNOSIS — Z79899 Other long term (current) drug therapy: Secondary | ICD-10-CM | POA: Diagnosis not present

## 2018-10-07 DIAGNOSIS — Z794 Long term (current) use of insulin: Secondary | ICD-10-CM | POA: Diagnosis not present

## 2018-10-07 DIAGNOSIS — E039 Hypothyroidism, unspecified: Secondary | ICD-10-CM | POA: Insufficient documentation

## 2018-10-07 DIAGNOSIS — Z885 Allergy status to narcotic agent status: Secondary | ICD-10-CM | POA: Insufficient documentation

## 2018-10-07 DIAGNOSIS — E785 Hyperlipidemia, unspecified: Secondary | ICD-10-CM | POA: Insufficient documentation

## 2018-10-07 DIAGNOSIS — M19042 Primary osteoarthritis, left hand: Secondary | ICD-10-CM | POA: Insufficient documentation

## 2018-10-07 DIAGNOSIS — Z79890 Hormone replacement therapy (postmenopausal): Secondary | ICD-10-CM | POA: Diagnosis not present

## 2018-10-07 DIAGNOSIS — Z8371 Family history of colonic polyps: Secondary | ICD-10-CM | POA: Insufficient documentation

## 2018-10-07 DIAGNOSIS — Z800 Family history of malignant neoplasm of digestive organs: Secondary | ICD-10-CM

## 2018-10-07 DIAGNOSIS — Z1211 Encounter for screening for malignant neoplasm of colon: Secondary | ICD-10-CM | POA: Diagnosis not present

## 2018-10-07 DIAGNOSIS — K648 Other hemorrhoids: Secondary | ICD-10-CM | POA: Diagnosis not present

## 2018-10-07 DIAGNOSIS — Z7982 Long term (current) use of aspirin: Secondary | ICD-10-CM | POA: Insufficient documentation

## 2018-10-07 DIAGNOSIS — Z853 Personal history of malignant neoplasm of breast: Secondary | ICD-10-CM | POA: Insufficient documentation

## 2018-10-07 DIAGNOSIS — K5730 Diverticulosis of large intestine without perforation or abscess without bleeding: Secondary | ICD-10-CM | POA: Insufficient documentation

## 2018-10-07 DIAGNOSIS — Z1501 Genetic susceptibility to malignant neoplasm of breast: Secondary | ICD-10-CM

## 2018-10-07 HISTORY — PX: COLONOSCOPY WITH PROPOFOL: SHX5780

## 2018-10-07 LAB — GLUCOSE, CAPILLARY: Glucose-Capillary: 104 mg/dL — ABNORMAL HIGH (ref 70–99)

## 2018-10-07 SURGERY — COLONOSCOPY WITH PROPOFOL
Anesthesia: General

## 2018-10-07 MED ORDER — LACTATED RINGERS IV SOLN
INTRAVENOUS | Status: DC
  Administered 2018-10-07: 14:00:00 via INTRAVENOUS

## 2018-10-07 MED ORDER — GLYCOPYRROLATE 0.2 MG/ML IJ SOLN
INTRAMUSCULAR | Status: DC | PRN
  Administered 2018-10-07: .2 mg via INTRAVENOUS

## 2018-10-07 MED ORDER — HYDROMORPHONE HCL 1 MG/ML IJ SOLN: 0.2500 mg | INTRAMUSCULAR | Status: DC | PRN

## 2018-10-07 MED ORDER — PROMETHAZINE HCL 25 MG/ML IJ SOLN: 6.2500 mg | INTRAMUSCULAR | Status: DC | PRN

## 2018-10-07 MED ORDER — KETAMINE HCL 50 MG/5ML IJ SOSY
PREFILLED_SYRINGE | INTRAMUSCULAR | Status: AC
  Filled 2018-10-07: qty 5

## 2018-10-07 MED ORDER — CHLORHEXIDINE GLUCONATE CLOTH 2 % EX PADS: 6.0000 | MEDICATED_PAD | Freq: Once | CUTANEOUS | Status: DC

## 2018-10-07 MED ORDER — HYDROCODONE-ACETAMINOPHEN 7.5-325 MG PO TABS: 1.0000 | ORAL_TABLET | Freq: Once | ORAL | Status: DC | PRN

## 2018-10-07 MED ORDER — MIDAZOLAM HCL 2 MG/2ML IJ SOLN: 0.5000 mg | Freq: Once | INTRAMUSCULAR | Status: DC | PRN

## 2018-10-07 MED ORDER — PROPOFOL 500 MG/50ML IV EMUL
INTRAVENOUS | Status: DC | PRN
  Administered 2018-10-07: 150 ug/kg/min via INTRAVENOUS
  Administered 2018-10-07: 14:00:00 via INTRAVENOUS

## 2018-10-07 MED ORDER — KETAMINE HCL 10 MG/ML IJ SOLN
INTRAMUSCULAR | Status: DC | PRN
  Administered 2018-10-07: 20 mg via INTRAVENOUS

## 2018-10-07 MED ORDER — PROPOFOL 10 MG/ML IV BOLUS
INTRAVENOUS | Status: DC | PRN
  Administered 2018-10-07: 20 mg via INTRAVENOUS

## 2018-10-07 MED ORDER — GLYCOPYRROLATE 0.2 MG/ML IJ SOLN
INTRAMUSCULAR | Status: AC
  Filled 2018-10-07: qty 1

## 2018-10-07 MED ORDER — LIDOCAINE HCL (CARDIAC) PF 100 MG/5ML IV SOSY
PREFILLED_SYRINGE | INTRAVENOUS | Status: DC | PRN
  Administered 2018-10-07: 20 mg via INTRAVENOUS

## 2018-10-07 MED ORDER — OMEPRAZOLE 20 MG PO CPDR: DELAYED_RELEASE_CAPSULE | ORAL | 3 refills | Status: DC

## 2018-10-07 MED ORDER — PROPOFOL 10 MG/ML IV BOLUS
INTRAVENOUS | Status: AC
  Filled 2018-10-07: qty 40

## 2018-10-07 NOTE — Anesthesia Procedure Notes (Signed)
Procedure Name: General with mask airway Performed by: Eliazar Olivar A, CRNA Pre-anesthesia Checklist: Timeout performed, Patient being monitored, Suction available, Emergency Drugs available and Patient identified       

## 2018-10-07 NOTE — Transfer of Care (Signed)
Immediate Anesthesia Transfer of Care Note  Patient: Kelly Jacobson  Procedure(s) Performed: COLONOSCOPY WITH PROPOFOL (N/A )  Patient Location: PACU  Anesthesia Type:General  Level of Consciousness: awake, oriented and patient cooperative  Airway & Oxygen Therapy: Patient Spontanous Breathing  Post-op Assessment: Report given to RN and Post -op Vital signs reviewed and stable  Post vital signs: Reviewed and stable  Last Vitals:  Vitals Value Taken Time  BP    Temp    Pulse 92 10/07/18 1422  Resp 18 10/07/18 1422  SpO2 99 % 10/07/18 1422  Vitals shown include unvalidated device data.  Last Pain:  Vitals:   10/07/18 1135  TempSrc: Oral  PainSc: 0-No pain         Complications: No apparent anesthesia complications

## 2018-10-07 NOTE — Discharge Instructions (Signed)
You DID NOT HAVE ANY POLYPS. YOU HAVE DIVERTICULOSIS IN YOUR RIGHT AND LEFT COLON. You have internal AND EXTERNAL hemorrhoids.   EAT TO LIVE AND THINK OF FOOD AS MEDICINE. 75% OF YOUR PLATE SHOULD BE FRUITS/VEGGIES.  DRINK WATER TO KEEP YOUR URINE LIGHT YELLOW.  To have more energy and to lose weight:      1. CONTINUE YOUR WEIGHT LOSS EFFORTS. I RECOMMEND YOU READ AND FOLLOW RECOMMENDATIONS BY DR. MARK HYMAN, "10-DAY DETOX DIET".    2. If you must eat bread, EAT EZEKIEL BREAD. IT IS IN THE FROZEN SECTION OF THE GROCERY STORE.    3. Do not drink SODA, GATORADE, ENERGY DRINKS, OR DIET SODA.     4. AVOID HIGH FRUCTOSE CORN SYRUP.     5. DO NOT chew SUGAR FREE GUM OR USE ARTIFICIAL SWEETENERS. USE STEVIA AS A SWEETENER.    6. DO NOT EAT ENRICHED WHEAT FLOUR, PASTA, RICE, OR CEREAL.    7. ONLY EAT WILD CAUGHT SEAFOOD, GRASS FED BEEF OR CHICKEN, PORK FROM PASTURE RAISE PIGS, OR EGGS FROM PASTURE RAISED CHICKENS.    8. PRACTICE CHAIR YOGA FOR 15-30 MINS 3 OR 4 TIMES A WEEK AND PROGRESS TO HATHA YOGA OVER NEXT 6 MOS.    9. START TAKING A MULTIVITAMIN, VITAMIN B12, AND VITAMIN D3 2000 IU DAILY.   TO PREVENT ULCERS AND GASTRITIS DUE TO ASPIRIN AND IBUPROFEN USE, START OMEPRAZOLE 30 MINUTES PRIOR TO YOUR FIRST MEAL.  Next colonoscopy in 5 years.   Colonoscopy Care After Read the instructions outlined below and refer to this sheet in the next week. These discharge instructions provide you with general information on caring for yourself after you leave the hospital. While your treatment has been planned according to the most current medical practices available, unavoidable complications occasionally occur. If you have any problems or questions after discharge, call DR. FIELDS, [phone].  ACTIVITY  You may resume your regular activity, but move at a slower pace for the next 24 hours.   Take frequent rest periods for the next 24 hours.   Walking will help get rid of the air and reduce  the bloated feeling in your belly (abdomen).   No driving for 24 hours (because of the medicine (anesthesia) used during the test).   You may shower.   Do not sign any important legal documents or operate any machinery for 24 hours (because of the anesthesia used during the test).    NUTRITION  Drink plenty of fluids.   You may resume your normal diet as instructed by your doctor.   Begin with a light meal and progress to your normal diet. Heavy or fried foods are harder to digest and may make you feel sick to your stomach (nauseated).   Avoid alcoholic beverages for 24 hours or as instructed.    MEDICATIONS  You may resume your normal medications.   WHAT YOU CAN EXPECT TODAY  Some feelings of bloating in the abdomen.   Passage of more gas than usual.   Spotting of blood in your stool or on the toilet paper  .  IF YOU HAD POLYPS REMOVED DURING THE COLONOSCOPY:  Eat a soft diet IF YOU HAVE NAUSEA, BLOATING, ABDOMINAL PAIN, OR VOMITING.    FINDING OUT THE RESULTS OF YOUR TEST Not all test results are available during your visit. DR. Oneida Alar WILL CALL YOU WITHIN 7 DAYS OF YOUR PROCEDUE WITH YOUR RESULTS. Do not assume everything is normal if you have not heard from  DR. Oneida Alar IN ONE WEEK, CALL HER OFFICE AT [phone].  SEEK IMMEDIATE MEDICAL ATTENTION AND CALL THE OFFICE: [phone] IF:  You have more than a spotting of blood in your stool.   Your belly is swollen (abdominal distention).   You are nauseated or vomiting.   You have a temperature over 101F.   You have abdominal pain or discomfort that is severe or gets worse throughout the day.    High-Fiber Diet A high-fiber diet changes your normal diet to include more whole grains, legumes, fruits, and vegetables. Changes in the diet involve replacing refined carbohydrates with unrefined foods. The calorie level of the diet is essentially unchanged. The Dietary Reference Intake (recommended amount) for adult  males is 38 grams per day. For adult females, it is 25 grams per day. Pregnant and lactating women should consume 28 grams of fiber per day. Fiber is the intact part of a plant that is not broken down during digestion. Functional fiber is fiber that has been isolated from the plant to provide a beneficial effect in the body.  PURPOSE  Increase stool bulk.   Ease and regulate bowel movements.   Lower cholesterol.   REDUCE RISK OF COLON CANCER  INDICATIONS THAT YOU NEED MORE FIBER  Constipation and hemorrhoids.   Uncomplicated diverticulosis (intestine condition) and irritable bowel syndrome.   Weight management.   As a protective measure against hardening of the arteries (atherosclerosis), diabetes, and cancer.   GUIDELINES FOR INCREASING FIBER IN THE DIET  Start adding fiber to the diet slowly. A gradual increase of about 5 more grams (2 servings of most fruits or vegetables) per day is best. Too rapid an increase in fiber may result in constipation, flatulence, and bloating.   Drink enough water and fluids to keep your urine clear or pale yellow. Water, juice, or caffeine-free drinks are recommended. Not drinking enough fluid may cause constipation.   Eat a variety of high-fiber foods rather than one type of fiber.   Try to increase your intake of fiber through using high-fiber foods rather than fiber pills or supplements that contain small amounts of fiber.   The goal is to change the types of food eaten. Do not supplement your present diet with high-fiber foods, but replace foods in your present diet.    Diverticulosis Diverticulosis is a common condition that develops when small pouches (diverticula) form in the wall of the colon. The risk of diverticulosis increases with age. It happens more often in people who eat a low-fiber diet. Most individuals with diverticulosis have no symptoms. Those individuals with symptoms usually experience belly (abdominal) pain, constipation,  or loose stools (diarrhea).  HOME CARE INSTRUCTIONS  Increase the amount of fiber in your diet as directed by your caregiver or dietician. This may reduce symptoms of diverticulosis.   Drink at least 6 to 8 glasses of water each day to prevent constipation.   Try not to strain when you have a bowel movement.   THERE IS NO NEED TO Avoid nuts and seeds to prevent complications.   FOODS HAVING HIGH FIBER CONTENT INCLUDE:  Fruits. Apple, peach, pear, tangerine, raisins, prunes.   Vegetables. Brussels sprouts, asparagus, broccoli, cabbage, carrot, cauliflower, romaine lettuce, spinach, summer squash, tomato, winter squash, zucchini.   Starchy Vegetables. Baked beans, kidney beans, lima beans, split peas, lentils, potatoes (with skin).  Marland Kitchen

## 2018-10-07 NOTE — Anesthesia Preprocedure Evaluation (Signed)
Anesthesia Evaluation  Patient identified by MRN, date of birth, ID band Patient awake    Reviewed: Allergy & Precautions, NPO status , Patient's Chart, lab work & pertinent test results  History of Anesthesia Complications Negative for: history of anesthetic complications  Airway Mallampati: II  TM Distance: >3 FB Neck ROM: Full    Dental no notable dental hx. (+) Teeth Intact   Pulmonary neg pulmonary ROS,    Pulmonary exam normal breath sounds clear to auscultation       Cardiovascular Exercise Tolerance: Good negative cardio ROS Normal cardiovascular examI Rhythm:Regular Rate:Normal     Neuro/Psych PSYCHIATRIC DISORDERS Anxiety Depression negative neurological ROS     GI/Hepatic negative GI ROS, Neg liver ROS,   Endo/Other  diabetes, Well Controlled, Type 1, Insulin Dependent, Oral Hypoglycemic AgentsHypothyroidism   Renal/GU negative Renal ROS  negative genitourinary   Musculoskeletal  (+) Arthritis , Osteoarthritis,    Abdominal   Peds negative pediatric ROS (+)  Hematology negative hematology ROS (+)   Anesthesia Other Findings H/o Breast Ca in past -Had Bilat mastectomy -had RT on the R side  Here for screening colonoscopy  Reproductive/Obstetrics negative OB ROS                             Anesthesia Physical Anesthesia Plan  ASA: III  Anesthesia Plan: General   Post-op Pain Management:    Induction: Intravenous  PONV Risk Score and Plan: 3 and TIVA, Propofol infusion, Ondansetron and Treatment may vary due to age or medical condition  Airway Management Planned: Nasal Cannula and Simple Face Mask  Additional Equipment:   Intra-op Plan:   Post-operative Plan:   Informed Consent: I have reviewed the patients History and Physical, chart, labs and discussed the procedure including the risks, benefits and alternatives for the proposed anesthesia with the patient or  authorized representative who has indicated his/her understanding and acceptance.     Dental advisory given  Plan Discussed with: CRNA  Anesthesia Plan Comments: (Plan Full PPE use  Plan GA with GETA as needed d/w pt -WTP with same after Q&A)        Anesthesia Quick Evaluation

## 2018-10-07 NOTE — Anesthesia Postprocedure Evaluation (Signed)
Anesthesia Post Note  Patient: Kelly Jacobson  Procedure(s) Performed: COLONOSCOPY WITH PROPOFOL (N/A )  Anesthesia Type: General Level of consciousness: awake and alert, oriented and patient cooperative Pain management: pain level controlled Vital Signs Assessment: post-procedure vital signs reviewed and stable Respiratory status: spontaneous breathing Cardiovascular status: stable Postop Assessment: no apparent nausea or vomiting Anesthetic complications: no     Last Vitals:  Vitals:   10/07/18 1135  BP: 108/68  Pulse: 83  Resp: 18  Temp: 37 C  SpO2: 95%    Last Pain:  Vitals:   10/07/18 1135  TempSrc: Oral  PainSc: 0-No pain                 ADAMS, AMY A

## 2018-10-07 NOTE — Op Note (Signed)
[facility] Patient Name: Kelly Jacobson Procedure Date: 10/07/2018 1:20 PM MRN: [id number] Date of Birth: 1960-08-31 Attending MD: Barney Drain MD, MD CSN: [id number] Age: 58 Admit Type: Outpatient Procedure:                Colonoscopy, SCREENING Indications:              Screening for colon cancer: Family history of colon                            polyps in distant relative(s)/PT IS CHEK2 mutation                            ([id number]). Providers:                Barney Drain MD, MD, Hinton Rao, RN, Aram Candela Referring MD:             [name], MD Medicines:                Propofol per Anesthesia Complications:            No immediate complications. Estimated Blood Loss:     Estimated blood loss: none. Procedure:                Pre-Anesthesia Assessment:                           - Prior to the procedure, a History and Physical                            was performed, and patient medications and                            allergies were reviewed. The patient's tolerance of                            previous anesthesia was also reviewed. The risks                            and benefits of the procedure and the sedation                            options and risks were discussed with the patient.                            All questions were answered, and informed consent                            was obtained. Prior Anticoagulants: The patient has                            taken no previous anticoagulant or antiplatelet                            agents except for NSAID medication. ASA Grade  Assessment: II - A patient with mild systemic                            disease. After reviewing the risks and benefits,                            the patient was deemed in satisfactory condition to                            undergo the procedure. After obtaining informed                            consent, the colonoscope was passed under  direct                            vision. Throughout the procedure, the patient's                            blood pressure, pulse, and oxygen saturations were                            monitored continuously. The [id number]-[id number] ([id number])                            scope was introduced through the anus and advanced                            to the the cecum, identified by appendiceal orifice                            and ileocecal valve. The colonoscopy was somewhat                            difficult due to a tortuous colon. Successful                            completion of the procedure was aided by using                            manual pressure, straightening and shortening the                            scope to obtain bowel loop reduction and COLOWRAP.                            The patient tolerated the procedure well. The                            quality of the bowel preparation was good. The                            ileocecal valve, appendiceal orifice, and rectum  were photographed. Scope In: 1:55:40 PM Scope Out: 2:13:41 PM Scope Withdrawal Time: 0 hours 12 minutes 51 seconds  Total Procedure Duration: 0 hours 18 minutes 1 second  Findings:      Multiple small and large-mouthed diverticula were found in the       recto-sigmoid colon, sigmoid colon and hepatic flexure.      The recto-sigmoid colon, sigmoid colon and descending colon were       moderately tortuous.      External and internal hemorrhoids were found. Impression:               - Diverticulosis in the recto-sigmoid colon, in the                            sigmoid colon and at the hepatic flexure.                           - Tortuous colon.                           - External and internal hemorrhoids. Moderate Sedation:      Per Anesthesia Care Recommendation:           - Patient has a contact number available for                            emergencies. The signs and symptoms of  potential                            delayed complications were discussed with the                            patient. Return to normal activities tomorrow.                            Written discharge instructions were provided to the                            patient.                           - High fiber diet.                           - Continue present medications.                           - Await pathology results.                           - Repeat colonoscopy in 5 years for surveillance                            DUE TO CHEK 2 MUTATION. Procedure Code(s):        --- Professional ---                           45378, Colonoscopy, flexible; diagnostic, including                              collection of specimen(s) by brushing or washing,                            when performed (separate procedure) Diagnosis Code(s):        --- Professional ---                           Z12.11, Encounter for screening for malignant                            neoplasm of colon                           Z83.71, Family history of colonic polyps                           K64.8, Other hemorrhoids                           K57.30, Diverticulosis of large intestine without                            perforation or abscess without bleeding                           Q43.8, Other specified congenital malformations of                            intestine CPT copyright 2019 [employer]. All rights reserved. The codes documented in this report are preliminary and upon coder review may  be revised to meet current compliance requirements. Sandi Fields, MD Sandi Fields MD, MD 10/07/2018 2:29:51 PM This report has been signed electronically. Number of Addenda: 0 

## 2018-10-07 NOTE — H&P (Signed)
Primary Care Physician:  [name], MD Primary Gastroenterologist:  Dr. Oneida Alar  Pre-Procedure History & Physical: HPI:  Kelly Jacobson is a 58 y.o. female here for [place].  Past Medical History:  Diagnosis Date  . Anxiety    Xanax  . Arthritis    Hands and legs  . 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 Complication of anesthesia    SLOW TO WAKE UP  . Depression    wellburtin  . Diabetes mellitus   . DM (diabetes mellitus) ([place]) 11/20/2010  . Edema    hands and feet at times, takes as a direutic  . Hot flashes secondary to Arimidex 11/20/2010  . Hyperlipidemia   . Hypothyroidism 11/20/2010   Synthroid  . Thyroid disease   . Varicose veins 2014    Past Surgical History:  Procedure Laterality Date  . ABDOMINAL HYSTERECTOMY  2001   vaginal for endometriosis  . axillary cyst  01/25/2009  . AXILLARY NODE DISSECTION  02/23/2009 and 03/16/2009  . BILATERAL OOPHORECTOMY  04/19/2009  . BREAST REDUCTION SURGERY  1991  . CARPAL TUNNEL RELEASE  04/06/1998  . CARPAL TUNNEL RELEASE  [id number]   lt hand  . CERVICAL FUSION  01/15/2007  . COLONOSCOPY N/A 05/07/2013   Procedure: COLONOSCOPY;  Surgeon: Danie Binder, MD;  [facility];  Service: Endoscopy;  Laterality: N/A;  8:30 AM  . DEBRIDEMENT TENNIS ELBOW  2003  . PORT-A-CATH REMOVAL  01/16/2011   Procedure: REMOVAL PORT-A-CATH;  Surgeon: Stark Klein, MD;  Location: [facility];  Service: General;  Laterality: N/A;  Removal port-a-cath.  Sol Passer PLACEMENT  05/02/2009  . RECTOCELE REPAIR  2012  . rectocele surgery  03/07/10  . TOTAL MASTECTOMY Bilateral 01/13/2015   Procedure: BILATERAL MASTECTOMY;  Surgeon: Stark Klein, MD;  [facility];  Service: General;  Laterality: Bilateral;  . trigger thumb surgery  02/01/2010  . VEIN SURGERY  2005 / 2006 / 2014   BOTH LEGS    Prior to Admission medications   Medication Sig Start Date End Date Taking? Authorizing  Provider  ALPRAZolam Duanne Moron) 0.25 MG tablet Take 0.25 mg by mouth at bedtime as needed for anxiety.    Yes [provider]  aspirin 81 MG tablet Take 81 mg by mouth daily.   Yes [provider]  atorvastatin (LIPITOR) 20 MG tablet Take 20 mg by mouth daily.   Yes [provider]  buPROPion (WELLBUTRIN SR) 200 MG 12 hr tablet Take 200 mg by mouth daily.   Yes [provider]  Cholecalciferol (VITAMIN D) 50 MCG (2000 UT) CAPS Take 2,000 Units by mouth daily.    Yes [provider]  empagliflozin (JARDIANCE) 25 MG TABS tablet Take 25 mg by mouth daily.    Yes [provider]  ferrous sulfate 325 (65 FE) MG tablet Take 325 mg by mouth daily with breakfast.   Yes [provider]  ibuprofen (ADVIL,MOTRIN) 800 MG tablet Take 800 mg by mouth every 8 (eight) hours as needed for moderate pain. pain   Yes [provider]  Insulin Degludec (TRESIBA FLEXTOUCH) 200 UNIT/ML SOPN Inject 36 Units into the skin daily.    Yes [provider]  levothyroxine (SYNTHROID, LEVOTHROID) 50 MCG tablet Take 50 mcg by mouth daily.     Yes [provider]  Multiple Vitamin (MULTIVITAMIN WITH MINERALS) TABS tablet Take 1 tablet by mouth daily.   Yes [provider]  Oyster Shell (OYSTER CALCIUM) 500 MG TABS tablet Take 500 mg of elemental calcium by mouth daily.   Yes [provider]  Semaglutide (OZEMPIC, 0.25 OR 0.5 MG/DOSE, [place]) Inject 0.5 mg into the skin every 7 (seven) days.    Yes [provider]  telmisartan (MICARDIS) 40 MG tablet Take 40 mg by mouth daily.   Yes [provider]  venlafaxine (EFFEXOR) 37.5 MG tablet Take 37.5 mg by mouth daily.   Yes [provider]  vitamin E 400 UNIT capsule Take 400 Units by mouth daily.   Yes [provider]  zolpidem (AMBIEN CR) 12.5 MG CR tablet Take 12.5 mg by mouth at bedtime as needed for sleep.  12/06/14  Yes [provider]   metFORMIN (GLUCOPHAGE) 1000 MG tablet Take 1,000 mg by mouth daily with breakfast.     [provider]    Allergies as of 07/09/2018 - Review Complete 07/09/2018  Allergen Reaction Noted  . Tape Hives 01/05/2011  . Propoxyphene Other (See Comments) 12/29/2014    Family History  Problem Relation Age of Onset  . Melanoma Father        dx. 29s  . Heart attack Maternal Uncle   . Other Paternal Aunt        hysterectomy  . Diabetes Paternal Aunt   . Cancer Paternal Aunt        unknown type  . Colon cancer Paternal Grandmother        dx. 50s-60s  . Heart attack Paternal Grandfather   . Throat cancer Cousin 78  . Prostate cancer Cousin 22  . Kidney cancer Paternal Aunt        dx. late 50s-early 60s  . Pancreatic cancer Other        (x2 paternal great aunts)  . Brain cancer Other        unknown tumor type  . Anesthesia problems Neg Hx   . Hypotension Neg Hx   . Malignant hyperthermia Neg Hx   . Pseudochol deficiency Neg Hx     Social History   Socioeconomic History  . Marital status: Single    Spouse name: Not on file  . Number of children: Not on file  . Years of education: Not on file  . Highest education level: Not on file  Occupational History  . Not on file  Social Needs  . Financial resource strain: Not on file  . Food insecurity    Worry: Not on file    Inability: Not on file  . Transportation needs    Medical: Not on file    Non-medical: Not on file  Tobacco Use  . Smoking status: Never Smoker  . Smokeless tobacco: Never Used  Substance and Sexual Activity  . Alcohol use: Yes    Comment: Occasional - maybe 4 drinks per year  . Drug use: No  . Sexual activity: Not Currently  Lifestyle  . Physical activity    Days per week: Not on file    Minutes per session: Not on file  . Stress: Not on file  Relationships  . Social Herbalist on phone: Not on file    Gets together: Not on file    Attends religious service: Not on file     Active member of club or organization: Not on file    Attends meetings of clubs or organizations: Not on file    Relationship status: Not on file  . Intimate partner violence  Fear of current or ex partner: Not on file    Emotionally abused: Not on file    Physically abused: Not on file    Forced sexual activity: Not on file  Other Topics Concern  . Not on file  Social History Narrative  . Not on file    Review of Systems: See HPI, otherwise negative ROS   Physical Exam: BP 108/68   Pulse 83   Temp 98.6 F (37 C) (Oral)   Resp 18   SpO2 95%  General:   Alert,  pleasant and cooperative in NAD Head:  Normocephalic and atraumatic. Neck:  Supple; Lungs:  Clear throughout to auscultation.    Heart:  Regular rate and rhythm. Abdomen:  Soft, nontender and nondistended. Normal bowel sounds, without guarding, and without rebound.   Neurologic:  Alert and  oriented x4;  grossly normal neurologically.  Impression/Plan:    SCREENING  Plan:  1. TCS TODAY DISCUSSED PROCEDURE, BENEFITS, & RISKS: < 1% chance of medication reaction, bleeding, perforation, ASPIRATION, or rupture of spleen/liver requiring surgery to fix it and missed polyps < 1 cm 10-20% of the time.

## 2018-10-10 ENCOUNTER — Encounter (HOSPITAL_COMMUNITY): Payer: Self-pay | Admitting: Gastroenterology

## 2018-11-03 ENCOUNTER — Other Ambulatory Visit: Payer: Self-pay

## 2018-11-03 DIAGNOSIS — Z20822 Contact with and (suspected) exposure to covid-19: Secondary | ICD-10-CM

## 2018-11-05 LAB — NOVEL CORONAVIRUS, NAA: SARS-CoV-2, NAA: NOT DETECTED

## 2018-12-09 ENCOUNTER — Other Ambulatory Visit: Payer: Self-pay

## 2018-12-09 DIAGNOSIS — Z20822 Contact with and (suspected) exposure to covid-19: Secondary | ICD-10-CM

## 2018-12-11 LAB — NOVEL CORONAVIRUS, NAA: SARS-CoV-2, NAA: NOT DETECTED

## 2018-12-25 ENCOUNTER — Ambulatory Visit: Payer: BC Managed Care – PPO | Attending: Internal Medicine

## 2018-12-25 ENCOUNTER — Other Ambulatory Visit: Payer: Self-pay

## 2018-12-25 DIAGNOSIS — Z20822 Contact with and (suspected) exposure to covid-19: Secondary | ICD-10-CM

## 2018-12-27 LAB — NOVEL CORONAVIRUS, NAA: SARS-CoV-2, NAA: NOT DETECTED

## 2019-06-29 ENCOUNTER — Inpatient Hospital Stay (HOSPITAL_COMMUNITY): Payer: BC Managed Care – PPO | Admitting: Nurse Practitioner

## 2019-06-29 NOTE — Assessment & Plan Note (Deleted)
1. Stage II (T1c, N1) Right breast cancer: -Grade 2 of right breast presenting in upper axilla with mass (actually superior portion of right arm). 1.9 cm mass with 3/26 lymph nodes positive with extracapsular extension. Both mets in lymph nodes were greater than 2 mm. Negative margins. Initial biopsy on 01/25/2009 with definitive surgery and lymph node dissection on 02/23/2009 followed by re-excision on 03/16/2009 with all martins clear. -ER 52%, PR 36%, Ki-67 33%, Her 2 negative.  -S/P radiation finished 12/2009. -Arimidex starting on 12/22/2009. 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  -01/2015 she went on to have bilateral mastectomies without reconstruction.  -Physical exam today did not reveal any suspsious masses.  -Labs  -RTC in 1 year with labs.

## 2019-06-30 ENCOUNTER — Ambulatory Visit (HOSPITAL_COMMUNITY): Payer: BC Managed Care – PPO | Admitting: Nurse Practitioner

## 2019-06-30 MED ORDER — DIPHENHYDRAMINE HCL 50 MG/ML IJ SOLN
INTRAMUSCULAR | Status: AC
  Filled 2019-06-30: qty 1

## 2019-07-01 ENCOUNTER — Inpatient Hospital Stay (HOSPITAL_COMMUNITY): Payer: BC Managed Care – PPO | Attending: Nurse Practitioner | Admitting: Nurse Practitioner

## 2019-07-01 ENCOUNTER — Other Ambulatory Visit: Payer: Self-pay

## 2019-07-01 DIAGNOSIS — C50919 Malignant neoplasm of unspecified site of unspecified female breast: Secondary | ICD-10-CM

## 2019-07-01 MED ORDER — LANREOTIDE ACETATE 120 MG/0.5ML ~~LOC~~ SOLN
SUBCUTANEOUS | Status: AC
  Filled 2019-07-01: qty 120

## 2019-07-01 MED ORDER — OCTREOTIDE ACETATE 20 MG IM KIT
PACK | INTRAMUSCULAR | Status: AC
  Filled 2019-07-01: qty 1

## 2019-07-01 NOTE — Assessment & Plan Note (Signed)
1.  Stage II right breast cancer: -Grade 2 of right breast presenting in the upper axillary with mass (actually superior portion of the right arm).  1.9 cm mass with 3/26 positive lymph nodes with extracapsular extension.  Both mets and lymph nodes were greater than 2 mm.  Negative margins. -Initial biopsy on 01/25/2009 with definitive surgery and lymph node dissection on 02/23/2009 followed by reexcision on 03/16/2009 with all margins clear. -ER 52%, PR 36%, Ki 6733%, HER-2 negative. -Status post ECT in dose dense fashion x4 cycles followed by weekly Taxol. -Status post radiation finished in December 2011. -Arimidex started on 12/22/2009.  She completed 5 years. -She underwent genetic testing revealing Chek 2 mutation on 08/2014. -She underwent bilateral mastectomies without reconstruction on 01/2015. -She had her labs done at her PCP office. -She wishes to be followed by her PCP from here on out.

## 2019-07-01 NOTE — Progress Notes (Signed)
[facility] Follow up:    [name], MD [name] [address]   DIAGNOSIS: Breast cancer   Cancer Staging Infiltrating ductal carcinoma of breast [facility]) Staging form: Breast, AJCC 7th Edition - Clinical: T1c - Unsigned - Pathologic: Stage IIA (T1c, N1a, cM0) - Signed by Stark Klein, MD on 12/26/2010   SUMMARY OF ONCOLOGIC HISTORY: Oncology History Overview Note  Stage II (T1c, N1), grade 2 of right breast presenting in upper axilla with mass (actually superior portion of right arm).  1.9 cm mass with 3/26 positive nodes with extracapsular extension.  Both mets in lymph nodes were greater than 2 mm.  Negative margins.  Initial biopsy on 01/25/2009 with definitive surgery and lymph node dissection on 02/23/2009 followed by re-excision on 03/16/2009 with all margins clear.  ER 52%, PR 36%, Ki-67 33% Her2 negative.  S/P EC in dose dense fashion x 4 cycles followed by weekly Taxol.  S/P radiation Finishing in December 2011.  Now on Arimidex starting on 12/22/2009.   Infiltrating ductal carcinoma of breast ([place])  01/18/2009 Initial Diagnosis   Simple excision of right axilla demonstrating high grade DCIS and invasive moderately  differentiated ductal carcinoma with positive rection margins.  ER 62%, PR 36%, Ki-67 33%, Her 2 negative   02/23/2009 Surgery   Right breast lumpectomy with sentinel node and axillary dissection ([place]) demonstrating 1.0 cm invasive tumor, a 0.3 cm DCIS involving deep margin, + LVI, 3/26 positive lymph nodes   02/23/2009 Pathologic Stage   pT1c, pN1a: Stage IIA   03/16/2009 Surgery   Re-excision of margins with negative disease   04/06/2009 Echocardiogram   EF 55-60%   04/19/2009 Surgery   Bilateral salpingo-oopherectomy: Benign. (L) periadnexal soft tissue with endometriosis & endosalpingiosis. (L) benign papillary cyst adenofibroma. (R) paratubal soft tissue with endosalpingiosis & benign cystic follicles/glandular  inclusions.    05/10/2009 - 06/23/2009 Chemotherapy   Epirubicin, Cytoxan x 4 cycles   07/06/2009 - 09/21/2009 Chemotherapy   Taxol x 12 cycles   10/17/2009 - 12/07/2009 Radiation Therapy   Adjuvant RT with Dr. Sondra Come at [facility]    12/22/2009 - 06/22/2014 Anti-estrogen oral therapy   Anastrazole x 5 years   12/25/2013 Pathology Results   Breast Cancer Index- 5.9% risk of distant recurrence for ER+, lymph node negative patients after 5 years giving her a low likelihood of benefit from extended endocine therapy.   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 Genes analyzed: APC, ATM, BARD1, BMPR1A, BRCA1, BRCA2, BRIP1, CDH1, CDK4, CDKN2A, CHEK2, MLH1, MRE11A, MSH2, MSH6, MUTYH, NBN, NF1, PALB2, PMS2, POLD1, POLE, PTEN, RAD50, RAD51C, RAD51D, SMAD4, SMARCA4A, STK11, & TP53.    12/27/2014 Imaging   DEXA scan: Normal. T-score -0.4   01/13/2015 Surgery   Bilateral mastectomies (Dr. Barry Dienes): Right breast-Benign breast tissue, 0/1 LNs.  Enlarged (L) axillary LN negative. 0/5 additional LNs negative.  No malignancy identified .     CURRENT THERAPY: Clinical observation  INTERVAL HISTORY: Kelly Jacobson 59 y.o. female was called for a telephone visit for breast cancer.  Patient reports she is doing well since her last visit.  She denies any new lumps or bumps present.  She denies any bone pain. Denies any nausea, vomiting, or diarrhea. Denies any new pains. Had not noticed any recent bleeding such as epistaxis, hematuria or hematochezia. Denies recent chest pain on exertion, shortness of breath on minimal exertion,  pre-syncopal episodes, or palpitations. Denies any numbness or tingling in hands or feet. Denies any recent fevers, infections, or recent hospitalizations. Patient reports appetite at 100% and energy level at 100%.  She is eating well maintain her weight this time.    Patient Active  Problem List   Diagnosis Date Noted  . Colon cancer screening 07/09/2018  . 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 Family history of colon cancer 09/02/2014  . Personal history of breast cancer 09/02/2014  . Family history of cancer 09/02/2014  . 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  . Genetic testing 08/30/2014  . Hypothyroidism 11/20/2010  . DM (diabetes mellitus) ([place]) 11/20/2010  . Hot flashes secondary to Arimidex 11/20/2010  . Infiltrating ductal carcinoma of breast ([place]) 09/06/2010    is allergic to tape and propoxyphene.  MEDICAL HISTORY: Past Medical History:  Diagnosis Date  . Anxiety    Xanax  . Arthritis    Hands and legs  . 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 Complication of anesthesia    SLOW TO WAKE UP  . Depression    wellburtin  . Diabetes mellitus   . DM (diabetes mellitus) ([place]) 11/20/2010  . Edema    hands and feet at times, takes as a direutic  . Hot flashes secondary to Arimidex 11/20/2010  . Hyperlipidemia   . Hypothyroidism 11/20/2010   Synthroid  . Thyroid disease   . Varicose veins 2014    SURGICAL HISTORY: Past Surgical History:  Procedure Laterality Date  . ABDOMINAL HYSTERECTOMY  2001   vaginal for endometriosis  . axillary cyst  01/25/2009  . AXILLARY NODE DISSECTION  02/23/2009 and 03/16/2009  . BILATERAL OOPHORECTOMY  04/19/2009  . BREAST REDUCTION SURGERY  1991  . CARPAL TUNNEL RELEASE  04/06/1998  . CARPAL TUNNEL RELEASE  [id number]   lt hand  . CERVICAL FUSION  01/15/2007  . COLONOSCOPY N/A 05/07/2013   Procedure: COLONOSCOPY;  Surgeon: Danie Binder, MD;  [facility];  Service: Endoscopy;  Laterality: N/A;  8:30 AM  . COLONOSCOPY WITH PROPOFOL N/A 10/07/2018   Procedure: COLONOSCOPY WITH PROPOFOL;  Surgeon: Danie Binder, MD;  [facility];  Service: Endoscopy;  Laterality: N/A;  1:15PM  . DEBRIDEMENT  TENNIS ELBOW  2003  . PORT-A-CATH REMOVAL  01/16/2011   Procedure: REMOVAL PORT-A-CATH;  Surgeon: Stark Klein, MD;  Location: [facility];  Service: General;  Laterality: N/A;  Removal port-a-cath.  Sol Passer PLACEMENT  05/02/2009  . RECTOCELE REPAIR  2012  . rectocele surgery  03/07/10  . TOTAL MASTECTOMY Bilateral 01/13/2015   Procedure: BILATERAL MASTECTOMY;  Surgeon: Stark Klein, MD;  [facility];  Service: General;  Laterality: Bilateral;  . trigger thumb surgery  02/01/2010  . VEIN SURGERY  2005 / 2006 / 2014   BOTH LEGS    SOCIAL HISTORY: Social History   Socioeconomic History  . Marital status: Single    Spouse name: Not on file  . Number of children: Not on file  . Years of education: Not on file  . Highest education level: Not on file  Occupational History  . Not on file  Tobacco Use  . Smoking status: Never Smoker  . Smokeless tobacco: Never Used  Vaping Use  . Vaping Use: Never used  Substance and Sexual Activity  . Alcohol use: Yes    Comment: Occasional - maybe 4 drinks per year  .  Drug use: No  . Sexual activity: Not Currently  Other Topics Concern  . Not on file  Social History Narrative  . Not on file   Social Determinants of Health   Financial Resource Strain:   . Difficulty of Paying Living Expenses:   Food Insecurity:   . Worried About Charity fundraiser in the Last Year:   . Arboriculturist in the Last Year:   Transportation Needs:   . Film/video editor (Medical):   Marland Kitchen Lack of Transportation (Non-Medical):   Physical Activity:   . Days of Exercise per Week:   . Minutes of Exercise per Session:   Stress:   . Feeling of Stress :   Social Connections:   . Frequency of Communication with Friends and Family:   . Frequency of Social Gatherings with Friends and Family:   . Attends Religious Services:   . Active Member of Clubs or Organizations:   . Attends Archivist Meetings:   Marland Kitchen Marital Status:   Intimate Partner Violence:   .  Fear of Current or Ex-Partner:   . Emotionally Abused:   Marland Kitchen Physically Abused:   . Sexually Abused:     FAMILY HISTORY: Family History  Problem Relation Age of Onset  . Melanoma Father        dx. 79s  . Heart attack Maternal Uncle   . Other Paternal Aunt        hysterectomy  . Diabetes Paternal Aunt   . Cancer Paternal Aunt        unknown type  . Colon cancer Paternal Grandmother        dx. 50s-60s  . Heart attack Paternal Grandfather   . Throat cancer Cousin 77  . Prostate cancer Cousin 75  . Kidney cancer Paternal Aunt        dx. late 50s-early 60s  . Pancreatic cancer Other        (x2 paternal great aunts)  . Brain cancer Other        unknown tumor type  . Anesthesia problems Neg Hx   . Hypotension Neg Hx   . Malignant hyperthermia Neg Hx   . Pseudochol deficiency Neg Hx     Review of Systems  All other systems reviewed and are negative.   Vital signs: -Deferred due to telephone visit  Physical Exam -Deferred due to telephone visit -Patient was alert and oriented over the phone and in no acute distress   LABORATORY DATA:  CBC    Component Value Date/Time   WBC 10.1 01/15/2015 0531   RBC 4.53 01/15/2015 0531   HGB 12.4 01/15/2015 0531   HCT 40.8 01/15/2015 0531   PLT 256 01/15/2015 0531   MCV 90.1 01/15/2015 0531   MCH 27.4 01/15/2015 0531   MCHC 30.4 01/15/2015 0531   RDW 13.9 01/15/2015 0531   LYMPHSABS 2.1 12/07/2014 1322   MONOABS 0.5 12/07/2014 1322   EOSABS 0.2 12/07/2014 1322   BASOSABS 0.0 12/07/2014 1322    CMP     Component Value Date/Time   NA 140 10/03/2018 1346   K 3.8 10/03/2018 1346   CL 104 10/03/2018 1346   CO2 24 10/03/2018 1346   GLUCOSE 131 (H) 10/03/2018 1346   BUN 23 (H) 10/03/2018 1346   CREATININE 0.68 10/03/2018 1346   CALCIUM 9.1 10/03/2018 1346   CALCIUM 9.3 12/22/2013 0957   PROT 7.3 12/07/2014 1322   ALBUMIN 4.3 12/07/2014 1322   AST 33 12/07/2014 1322  ALT 29 12/07/2014 1322   ALKPHOS 95 12/07/2014 1322    BILITOT 0.9 12/07/2014 1322   GFRNONAA >60 10/03/2018 1346   GFRAA >60 10/03/2018 1346     All questions were answered to patient's stated satisfaction. Encouraged patient to call with any new concerns or questions before his next visit to the cancer center and we can certain see him sooner, if needed.     ASSESSMENT and THERAPY PLAN:   Infiltrating ductal carcinoma of breast ([facility]) 1.  Stage II right breast cancer: -Grade 2 of right breast presenting in the upper axillary with mass (actually superior portion of the right arm).  1.9 cm mass with 3/26 positive lymph nodes with extracapsular extension.  Both mets and lymph nodes were greater than 2 mm.  Negative margins. -Initial biopsy on 01/25/2009 with definitive surgery and lymph node dissection on 02/23/2009 followed by reexcision on 03/16/2009 with all margins clear. -ER 52%, PR 36%, Ki 6733%, HER-2 negative. -Status post ECT in dose dense fashion x4 cycles followed by weekly Taxol. -Status post radiation finished in December 2011. -Arimidex started on 12/22/2009.  She completed 5 years. -She underwent genetic testing revealing Chek 2 mutation on 08/2014. -She underwent bilateral mastectomies without reconstruction on 01/2015. -She had her labs done at her PCP office. -She wishes to be followed by her PCP from here on out.   No orders of the defined types were placed in this encounter.   All questions were answered. The patient knows to call the clinic with any problems, questions or concerns. We can certainly see the patient much sooner if necessary. This note was electronically signed.   I provided 29 minutes of non face-to-face telephone visit time during this encounter, and > 50% was spent counseling as documented under my assessment & plan.   Glennie Isle, NP-C 07/01/2019

## 2019-07-19 ENCOUNTER — Ambulatory Visit
Admission: EM | Admit: 2019-07-19 | Discharge: 2019-07-19 | Disposition: A | Discharge status: Home / Self Care | Payer: BC Managed Care – PPO | Attending: Emergency Medicine | Admitting: Emergency Medicine

## 2019-07-19 ENCOUNTER — Ambulatory Visit (INDEPENDENT_AMBULATORY_CARE_PROVIDER_SITE_OTHER): Payer: BC Managed Care – PPO

## 2019-07-19 ENCOUNTER — Encounter: Payer: Self-pay | Admitting: Emergency Medicine

## 2019-07-19 ENCOUNTER — Other Ambulatory Visit: Payer: Self-pay

## 2019-07-19 DIAGNOSIS — M25572 Pain in left ankle and joints of left foot: Secondary | ICD-10-CM

## 2019-07-19 DIAGNOSIS — M25472 Effusion, left ankle: Secondary | ICD-10-CM

## 2019-07-19 DIAGNOSIS — M19072 Primary osteoarthritis, left ankle and foot: Secondary | ICD-10-CM

## 2019-07-19 MED ORDER — PREDNISONE 20 MG PO TABS: 20.0000 mg | ORAL_TABLET | Freq: Two times a day (BID) | ORAL | 0 refills | Status: AC

## 2019-07-19 NOTE — ED Triage Notes (Signed)
Left ankle pain and swelling for a few weeks.  No injury to note.

## 2019-07-19 NOTE — ED Triage Notes (Signed)
Ace wrap applied to left ankle.  Cardiovascular intact prior to applying and after application.

## 2019-07-19 NOTE — ED Provider Notes (Signed)
[place]   [id number] 07/19/19 Arrival Time: 0825  CC: Left ankle pain and injury  SUBJECTIVE: History from: patient. Kelly Jacobson is a 59 y.o. female complains of LT ankle pain and swelling x few weeks.  Denies a precipitating event or specific injury.  Localizes the pain to the outside of the ankle.  Describes the pain as intermittent and throbbing in character.  Has tried OTC medications without relief.  Symptoms are made worse with weight-bearing.  Denies similar symptoms in the past. Complains of associated swelling. Denies fever, chills, erythema, ecchymosis, weakness, numbness and tingling.    ROS: As per HPI.  All other pertinent ROS negative.     Past Medical History:  Diagnosis Date  . Anxiety    Xanax  . Arthritis    Hands and legs  . 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 Complication of anesthesia    SLOW TO WAKE UP  . Depression    wellburtin  . Diabetes mellitus   . DM (diabetes mellitus) ([place]) 11/20/2010  . Edema    hands and feet at times, takes as a direutic  . Hot flashes secondary to Arimidex 11/20/2010  . Hyperlipidemia   . Hypothyroidism 11/20/2010   Synthroid  . Thyroid disease   . Varicose veins 2014   Past Surgical History:  Procedure Laterality Date  . ABDOMINAL HYSTERECTOMY  2001   vaginal for endometriosis  . axillary cyst  01/25/2009  . AXILLARY NODE DISSECTION  02/23/2009 and 03/16/2009  . BILATERAL OOPHORECTOMY  04/19/2009  . BREAST REDUCTION SURGERY  1991  . CARPAL TUNNEL RELEASE  04/06/1998  . CARPAL TUNNEL RELEASE  [id number]   lt hand  . CERVICAL FUSION  01/15/2007  . COLONOSCOPY N/A 05/07/2013   Procedure: COLONOSCOPY;  Surgeon: Danie Binder, MD;  [facility];  Service: Endoscopy;  Laterality: N/A;  8:30 AM  . COLONOSCOPY WITH PROPOFOL N/A 10/07/2018   Procedure: COLONOSCOPY WITH PROPOFOL;  Surgeon: Danie Binder, MD;  [facility];  Service:  Endoscopy;  Laterality: N/A;  1:15PM  . DEBRIDEMENT TENNIS ELBOW  2003  . PORT-A-CATH REMOVAL  01/16/2011   Procedure: REMOVAL PORT-A-CATH;  Surgeon: Stark Klein, MD;  Location: [place];  Service: General;  Laterality: N/A;  Removal port-a-cath.  Sol Passer PLACEMENT  05/02/2009  . RECTOCELE REPAIR  2012  . rectocele surgery  03/07/10  . TOTAL MASTECTOMY Bilateral 01/13/2015   Procedure: BILATERAL MASTECTOMY;  Surgeon: Stark Klein, MD;  Location: [facility];  Service: General;  Laterality: Bilateral;  . trigger thumb surgery  02/01/2010  . VEIN SURGERY  2005 / 2006 / 2014   BOTH LEGS   Allergies  Allergen Reactions  . Tape Hives  . Propoxyphene Other (See Comments)    hallucination   Current Facility-Administered Medications on File Prior to Encounter  Medication Dose Route Frequency Provider Last Rate Last Admin  . sodium chloride 0.9 % injection 10 mL  10 mL Intravenous PRN Baird Cancer, PA-C   10 mL at 11/20/10 1326   Current Outpatient Medications on File Prior to Encounter  Medication Sig Dispense Refill  . ALPRAZolam (XANAX) 0.25 MG tablet Take 0.25 mg by mouth at bedtime as needed for anxiety.     Marland Kitchen aspirin 81 MG tablet Take 81 mg by mouth daily.    Marland Kitchen atorvastatin (LIPITOR) 20 MG tablet Take 20 mg by mouth daily.    Marland Kitchen buPROPion [facility]  SR) 200 MG 12 hr tablet Take 200 mg by mouth daily.    . Cholecalciferol (VITAMIN D) 50 MCG (2000 UT) CAPS Take 2,000 Units by mouth daily.     . empagliflozin (JARDIANCE) 25 MG TABS tablet Take 25 mg by mouth daily.     . ferrous sulfate 325 (65 FE) MG tablet Take 325 mg by mouth daily with breakfast.    . ibuprofen (ADVIL,MOTRIN) 800 MG tablet Take 800 mg by mouth every 8 (eight) hours as needed for moderate pain. pain    . Insulin Degludec (TRESIBA FLEXTOUCH) 200 UNIT/ML SOPN Inject 36 Units into the skin daily.     Marland Kitchen levothyroxine (SYNTHROID, LEVOTHROID) 50 MCG tablet Take 50 mcg by mouth daily.      . metFORMIN (GLUCOPHAGE) 1000 MG tablet  Take 1,000 mg by mouth daily with breakfast.     . Multiple Vitamin (MULTIVITAMIN WITH MINERALS) TABS tablet Take 1 tablet by mouth daily.    Marland Kitchen omeprazole (PRILOSEC) 20 MG capsule 1 PO 30 MINS PRIOR TO BREAKFAST. 90 capsule 3  . Oyster Shell (OYSTER CALCIUM) 500 MG TABS tablet Take 500 mg of elemental calcium by mouth daily.    . Semaglutide (OZEMPIC, 0.25 OR 0.5 MG/DOSE, [place]) Inject 0.5 mg into the skin every 7 (seven) days.     Marland Kitchen telmisartan (MICARDIS) 40 MG tablet Take 40 mg by mouth daily.    Marland Kitchen venlafaxine (EFFEXOR) 37.5 MG tablet Take 37.5 mg by mouth daily.    . vitamin E 400 UNIT capsule Take 400 Units by mouth daily.    Marland Kitchen zolpidem (AMBIEN CR) 12.5 MG CR tablet Take 12.5 mg by mouth at bedtime as needed for sleep.      Social History   Socioeconomic History  . Marital status: Single    Spouse name: Not on file  . Number of children: Not on file  . Years of education: Not on file  . Highest education level: Not on file  Occupational History  . Not on file  Tobacco Use  . Smoking status: Never Smoker  . Smokeless tobacco: Never Used  Vaping Use  . Vaping Use: Never used  Substance and Sexual Activity  . Alcohol use: Yes    Comment: Occasional - maybe 4 drinks per year  . Drug use: No  . Sexual activity: Not Currently  Other Topics Concern  . Not on file  Social History Narrative  . Not on file   Social Determinants of Health   Financial Resource Strain:   . Difficulty of Paying Living Expenses:   Food Insecurity:   . Worried About Charity fundraiser in the Last Year:   . Arboriculturist in the Last Year:   Transportation Needs:   . Film/video editor (Medical):   Marland Kitchen Lack of Transportation (Non-Medical):   Physical Activity:   . Days of Exercise per Week:   . Minutes of Exercise per Session:   Stress:   . Feeling of Stress :   Social Connections:   . Frequency of Communication with Friends and Family:   . Frequency of Social Gatherings with Friends and  Family:   . Attends Religious Services:   . Active Member of Clubs or Organizations:   . Attends Archivist Meetings:   Marland Kitchen Marital Status:   Intimate Partner Violence:   . Fear of Current or Ex-Partner:   . Emotionally Abused:   Marland Kitchen Physically Abused:   . Sexually Abused:  Family History  Problem Relation Age of Onset  . Melanoma Father        dx. 11s  . Heart attack Maternal Uncle   . Other Paternal Aunt        hysterectomy  . Diabetes Paternal Aunt   . Cancer Paternal Aunt        unknown type  . Colon cancer Paternal Grandmother        dx. 50s-60s  . Heart attack Paternal Grandfather   . Throat cancer Cousin 78  . Prostate cancer Cousin 50  . Kidney cancer Paternal Aunt        dx. late 50s-early 60s  . Pancreatic cancer Other        (x2 paternal great aunts)  . Brain cancer Other        unknown tumor type  . Anesthesia problems Neg Hx   . Hypotension Neg Hx   . Malignant hyperthermia Neg Hx   . Pseudochol deficiency Neg Hx     OBJECTIVE:  Vitals:   07/19/19 0834 07/19/19 0835  BP: 128/72   Pulse: 87   Resp: 16   Temp: 98 F (36.7 C)   TempSrc: Oral   SpO2: 94%   Weight:  255 lb (115.7 kg)    General appearance: ALERT; in no acute distress.  Head: NCAT Lungs: Normal respiratory effort CV: Dorsalis pedis pulses 2+. Cap refill < 2 seconds Musculoskeletal: LT ankle Inspection: Swelling over lateral ankle Palpation: TTP over lateral ankle, NTTP over lateral malleolus ROM: LROM Strength: deferred due to pain Skin: warm and dry Neurologic: Ambulates with assistance from daughter; Sensation intact about the lower extremities Psychological: alert and cooperative; normal mood and affect  DIAGNOSTIC STUDIES:  DG Ankle Complete Left  Result Date: 07/19/2019 CLINICAL DATA:  Pain and swelling EXAM: LEFT ANKLE COMPLETE - 3+ VIEW COMPARISON:  None. FINDINGS: Frontal, oblique, and lateral views obtained. There is soft tissue swelling. There is no  appreciable acute fracture or joint effusion. There is mild osteoarthritic change in the ankle joint. There is spurring in the dorsal midfoot. There are posterior and inferior calcaneal spurs. Ankle mortise appears grossly intact. IMPRESSION: Soft tissue swelling. Areas of osteoarthritic change. There are calcaneal spurs. No evident fracture. Ankle mortise appears intact. Electronically Signed   By: Lowella Grip III M.D.   On: 07/19/2019 08:53    X-rays negative for bony abnormalities including fracture, or dislocation.  No soft tissue swelling.    I have reviewed the x-rays myself and the radiologist interpretation. I am in agreement with the radiologist interpretation.     ASSESSMENT & PLAN:  1. Acute left ankle pain   2. Arthritis of left ankle     Meds ordered this encounter  Medications  . predniSONE (DELTASONE) 20 MG tablet    Sig: Take 1 tablet (20 mg total) by mouth 2 (two) times daily with a meal for 5 days.    Dispense:  10 tablet    Refill:  0    Order Specific Question:   Supervising Provider    Answer:   Raylene Everts [[id number]]    Continue conservative management of rest, ice, and elevation Prednisone prescribed.  Take as directed and to completion Follow up with PCP if symptoms persist Return or go to the ER if you have any new or worsening symptoms (fever, chills, chest pain, swelling, bruising, redness, worsening symptoms, etc...)    Reviewed expectations re: course of current medical issues. Questions answered. Outlined signs and  symptoms indicating need for more acute intervention. Patient verbalized understanding. After Visit Summary given.    Lestine Box, PA-C 07/19/19 [id number]

## 2019-07-19 NOTE — Discharge Instructions (Addendum)
Continue conservative management of rest, ice, and elevation Prednisone prescribed.  Take as directed and to completion Follow up with PCP if symptoms persist Return or go to the ER if you have any new or worsening symptoms (fever, chills, chest pain, swelling, bruising, redness, worsening symptoms, etc...)

## 2019-09-03 ENCOUNTER — Other Ambulatory Visit: Payer: Self-pay | Admitting: Student

## 2019-09-03 DIAGNOSIS — M545 Low back pain, unspecified: Secondary | ICD-10-CM

## 2019-09-28 ENCOUNTER — Ambulatory Visit
Admission: RE | Admit: 2019-09-28 | Discharge: 2019-09-28 | Disposition: A | Discharge status: Home / Self Care | Payer: BC Managed Care – PPO | Source: Ambulatory Visit | Attending: Student | Admitting: Student

## 2019-09-28 ENCOUNTER — Other Ambulatory Visit: Payer: Self-pay

## 2019-09-28 DIAGNOSIS — G8929 Other chronic pain: Secondary | ICD-10-CM

## 2019-10-26 ENCOUNTER — Telehealth: Payer: Self-pay

## 2019-10-26 MED ORDER — OMEPRAZOLE 20 MG PO CPDR: DELAYED_RELEASE_CAPSULE | ORAL | 3 refills | Status: AC

## 2019-10-26 NOTE — Addendum Note (Signed)
Addended by: Annitta Needs on: 10/26/2019 03:34 PM   Modules accepted: Orders

## 2019-10-26 NOTE — Telephone Encounter (Signed)
Completed.

## 2019-10-26 NOTE — Telephone Encounter (Signed)
Refill request for Omeprazole 20 mg, one capsule 30 mins prior to breakfast, #90. Last refill was on 10/05/19. Additional refills will be placed on file.

## 2020-01-14 ENCOUNTER — Ambulatory Visit
Admission: EM | Admit: 2020-01-14 | Discharge: 2020-01-14 | Disposition: A | Discharge status: Home / Self Care | Payer: BC Managed Care – PPO | Attending: Emergency Medicine | Admitting: Emergency Medicine

## 2020-01-14 ENCOUNTER — Encounter: Payer: Self-pay | Admitting: Emergency Medicine

## 2020-01-14 ENCOUNTER — Other Ambulatory Visit: Payer: Self-pay

## 2020-01-14 DIAGNOSIS — J208 Acute bronchitis due to other specified organisms: Secondary | ICD-10-CM | POA: Diagnosis not present

## 2020-01-14 MED ORDER — BENZONATATE 100 MG PO CAPS: 100.0000 mg | ORAL_CAPSULE | Freq: Three times a day (TID) | ORAL | 0 refills | Status: AC | PRN

## 2020-01-14 MED ORDER — AZITHROMYCIN 250 MG PO TABS: 250.0000 mg | ORAL_TABLET | Freq: Every day | ORAL | 0 refills | Status: DC

## 2020-01-14 MED ORDER — ALBUTEROL SULFATE HFA 108 (90 BASE) MCG/ACT IN AERS: 1.0000 | INHALATION_SPRAY | Freq: Four times a day (QID) | RESPIRATORY_TRACT | 0 refills | Status: AC | PRN

## 2020-01-14 MED ORDER — PREDNISONE 10 MG PO TABS: 20.0000 mg | ORAL_TABLET | Freq: Every day | ORAL | 0 refills | Status: DC

## 2020-01-14 NOTE — Discharge Instructions (Addendum)
Get plenty of rest and push fluids Tessalon Perles prescribed for cough Azithromycin was prescribed ProAir was prescribed Prednisone was prescribed/take as directed Use medications daily for symptom relief Use OTC medications like ibuprofen or tylenol as needed fever or pain Call or go to the ED if you have any new or worsening symptoms such as fever, worsening cough, shortness of breath, chest tightness, chest pain, turning blue, changes in mental status, etc..Marland Kitchen

## 2020-01-14 NOTE — ED Provider Notes (Signed)
[place]   [id number] 01/14/20 Arrival Time: 4235   CC: COVID symptoms  SUBJECTIVE: History from: patient.  Kelly Jacobson is a 60 y.o. female who presented to the urgent care for complaint of strep throat infection 2 weeks ago and was prescribed amoxicillin.  She had a URI symptom as well that has not been resolved since.  Now is reporting coughing up green sputum with night sweats.  She states she has a COVID 19 and flu test done at the start of her symptoms 2 weeks ago and was negative.  Denies sick exposure to COVID, flu or strep.  Denies recent travel.   Denies previous symptoms in the past.   Denies fever, chills, fatigue, sinus pain, rhinorrhea, sore throat, SOB, wheezing, chest pain, nausea, changes in bowel or bladder habits.     ROS: As per HPI.  All other pertinent ROS negative.     Past Medical History:  Diagnosis Date  . Anxiety    Xanax  . Arthritis    Hands and legs  . 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 Complication of anesthesia    SLOW TO WAKE UP  . Depression    wellb[place]in  . Diabetes mellitus   . DM (diabetes mellitus) ([place]) 11/20/2010  . Edema    hands and feet at times, takes as a direutic  . Hot flashes secondary to Arimidex 11/20/2010  . Hyperlipidemia   . Hypothyroidism 11/20/2010   Synthroid  . Thyroid disease   . Varicose veins 2014   Past Surgical History:  Procedure Laterality Date  . ABDOMINAL HYSTERECTOMY  2001   vaginal for endometriosis  . axillary cyst  01/25/2009  . AXILLARY NODE DISSECTION  02/23/2009 and 03/16/2009  . BILATERAL OOPHORECTOMY  04/19/2009  . BREAST REDUCTION SURGERY  1991  . CARPAL TUNNEL RELEASE  04/06/1998  . CARPAL TUNNEL RELEASE  [id number]   lt hand  . CERVICAL FUSION  01/15/2007  . COLONOSCOPY N/A 05/07/2013   Procedure: COLONOSCOPY;  Surgeon: Danie Binder, MD;  [facility];  Service: Endoscopy;  Laterality: N/A;  8:30 AM  . COLONOSCOPY  WITH PROPOFOL N/A 10/07/2018   Procedure: COLONOSCOPY WITH PROPOFOL;  Surgeon: Danie Binder, MD;  [facility];  Service: Endoscopy;  Laterality: N/A;  1:15PM  . DEBRIDEMENT TENNIS ELBOW  2003  . PORT-A-CATH REMOVAL  01/16/2011   Procedure: REMOVAL PORT-A-CATH;  Surgeon: Stark Klein, MD;  Location: [place];  Service: General;  Laterality: N/A;  Removal port-a-cath.  Sol Passer PLACEMENT  05/02/2009  . RECTOCELE REPAIR  2012  . rectocele surgery  03/07/10  . TOTAL MASTECTOMY Bilateral 01/13/2015   Procedure: BILATERAL MASTECTOMY;  Surgeon: Stark Klein, MD;  [facility];  Service: General;  Laterality: Bilateral;  . trigger thumb surgery  02/01/2010  . VEIN SURGERY  2005 / 2006 / 2014   BOTH LEGS   Allergies  Allergen Reactions  . Tape Hives  . Propoxyphene Other (See Comments)    hallucination   Current Facility-Administered Medications on File Prior to Encounter  Medication Dose Route Frequency Provider Last Rate Last Admin  . sodium chloride 0.9 % injection 10 mL  10 mL Intravenous PRN Baird Cancer, PA-C   10 mL at 11/20/10 1326   Current Outpatient Medications on File Prior to Encounter  Medication Sig Dispense Refill  . ALPRAZolam (XANAX) 0.25 MG tablet Take 0.25 mg by mouth at bedtime as  needed for anxiety.     Marland Kitchen aspirin 81 MG tablet Take 81 mg by mouth daily.    Marland Kitchen atorvastatin (LIPITOR) 20 MG tablet Take 20 mg by mouth daily.    Marland Kitchen buPROPion (WELLBUTRIN SR) 200 MG 12 hr tablet Take 200 mg by mouth daily.    . Cholecalciferol (VITAMIN D) 50 MCG (2000 UT) CAPS Take 2,000 Units by mouth daily.     . empagliflozin (JARDIANCE) 25 MG TABS tablet Take 25 mg by mouth daily.     . ferrous sulfate 325 (65 FE) MG tablet Take 325 mg by mouth daily with breakfast.    . ibuprofen (ADVIL,MOTRIN) 800 MG tablet Take 800 mg by mouth every 8 (eight) hours as needed for moderate pain. pain    . Insulin Degludec (TRESIBA FLEXTOUCH) 200 UNIT/ML SOPN Inject 36 Units into the skin  daily.     Marland Kitchen levothyroxine (SYNTHROID, LEVOTHROID) 50 MCG tablet Take 50 mcg by mouth daily.      . metFORMIN (GLUCOPHAGE) 1000 MG tablet Take 1,000 mg by mouth daily with breakfast.     . Multiple Vitamin (MULTIVITAMIN WITH MINERALS) TABS tablet Take 1 tablet by mouth daily.    Marland Kitchen omeprazole (PRILOSEC) 20 MG capsule 1 PO 30 MINS PRIOR TO BREAKFAST. 90 capsule 3  . Oyster Shell (OYSTER CALCIUM) 500 MG TABS tablet Take 500 mg of elemental calcium by mouth daily.    . Semaglutide (OZEMPIC, 0.25 OR 0.5 MG/DOSE, [place]) Inject 0.5 mg into the skin every 7 (seven) days.     Marland Kitchen telmisartan (MICARDIS) 40 MG tablet Take 40 mg by mouth daily.    Marland Kitchen venlafaxine (EFFEXOR) 37.5 MG tablet Take 37.5 mg by mouth daily.    . vitamin E 400 UNIT capsule Take 400 Units by mouth daily.    Marland Kitchen zolpidem (AMBIEN CR) 12.5 MG CR tablet Take 12.5 mg by mouth at bedtime as needed for sleep.      Social History   Socioeconomic History  . Marital status: Single    Spouse name: Not on file  . Number of children: Not on file  . Years of education: Not on file  . Highest education level: Not on file  Occupational History  . Not on file  Tobacco Use  . Smoking status: Never Smoker  . Smokeless tobacco: Never Used  Vaping Use  . Vaping Use: Never used  Substance and Sexual Activity  . Alcohol use: Yes    Comment: Occasional - maybe 4 drinks per year  . Drug use: No  . Sexual activity: Not Currently  Other Topics Concern  . Not on file  Social History Narrative  . Not on file   Social Determinants of Health   Financial Resource Strain: Not on file  Food Insecurity: Not on file  Transportation Needs: Not on file  Physical Activity: Not on file  Stress: Not on file  Social Connections: Not on file  Intimate Partner Violence: Not on file   Family History  Problem Relation Age of Onset  . Melanoma Father        dx. 70s  . Heart attack Maternal Uncle   . Other Paternal Aunt        hysterectomy  . Diabetes  Paternal Aunt   . Cancer Paternal Aunt        unknown type  . Colon cancer Paternal Grandmother        dx. 50s-60s  . Heart attack Paternal Grandfather   . Throat cancer  Cousin 5  . Prostate cancer Cousin 74  . Kidney cancer Paternal Aunt        dx. late 50s-early 60s  . Pancreatic cancer Other        (x2 paternal great aunts)  . Brain cancer Other        unknown tumor type  . Anesthesia problems Neg Hx   . Hypotension Neg Hx   . Malignant hyperthermia Neg Hx   . Pseudochol deficiency Neg Hx     OBJECTIVE:  Vitals:   01/14/20 0915  BP: 100/66  Pulse: 95  Resp: 19  Temp: 98.7 F (37.1 C)  TempSrc: Oral  SpO2: 94%     General appearance: alert; appears fatigued, but nontoxic; speaking in full sentences and tolerating own secretions HEENT: NCAT; Ears: EACs clear, TMs pearly gray; Eyes: PERRL.  EOM grossly intact. Sinuses: nontender; Nose: nares patent without rhinorrhea, Throat: oropharynx clear, tonsils non erythematous or enlarged, uvula midline  Neck: supple without LAD Lungs: unlabored respirations, symmetrical air entry; cough: moderate; no respiratory distress; CTAB Heart: regular rate and rhythm.  Radial pulses 2+ symmetrical bilaterally Skin: warm and dry Psychological: alert and cooperative; normal mood and affect  LABS:  No results found for this or any previous visit (from the past 24 hour(s)).   ASSESSMENT & PLAN:  1. Acute bronchitis due to other specified organisms     Meds ordered this encounter  Medications  . azithromycin (ZITHROMAX) 250 MG tablet    Sig: Take 1 tablet (250 mg total) by mouth daily. Take first 2 tablets together, then 1 every day until finished.    Dispense:  6 tablet    Refill:  0  . predniSONE (DELTASONE) 10 MG tablet    Sig: Take 2 tablets (20 mg total) by mouth daily.    Dispense:  15 tablet    Refill:  0  . benzonatate (TESSALON) 100 MG capsule    Sig: Take 1 capsule (100 mg total) by mouth 3 (three) times daily as  needed for cough.    Dispense:  30 capsule    Refill:  0  . albuterol (VENTOLIN HFA) 108 (90 Base) MCG/ACT inhaler    Sig: Inhale 1-2 puffs into the lungs every 6 (six) hours as needed for wheezing or shortness of breath.    Dispense:  18 g    Refill:  0    Discharge instructions  Get plenty of rest and push fluids Tessalon Perles prescribed for cough Azithromycin was prescribed ProAir was prescribed Prednisone was prescribed/take as directed Use medications daily for symptom relief Use OTC medications like ibuprofen or tylenol as needed fever or pain Call or go to the ED if you have any new or worsening symptoms such as fever, worsening cough, shortness of breath, chest tightness, chest pain, turning blue, changes in mental status, etc...   Reviewed expectations re: course of current medical issues. Questions answered. Outlined signs and symptoms indicating need for more acute intervention. Patient verbalized understanding. After Visit Summary given.         Emerson Monte, FNP 01/14/20 1010

## 2020-01-14 NOTE — ED Triage Notes (Signed)
Reports she had strep throat x 2 weeks ago. States she has been coughing up green and having sweats since last Friday.  Pt states she had covid/flu test at the beginning of her symptoms and was negative.

## 2020-10-16 ENCOUNTER — Other Ambulatory Visit: Payer: Self-pay | Admitting: Gastroenterology

## 2020-10-17 ENCOUNTER — Encounter: Payer: Self-pay | Admitting: Gastroenterology

## 2020-10-17 NOTE — Telephone Encounter (Signed)
I do not see that we have ever seen patient for problems with heartburn/GERD. She was last seen in our office in 2020 and was not on omeprazole at that time per review of records.  Recommend office visit for additional refills if they are needed.  If she needs refills prior to being seen, recommend discussing with PCP.  If she would like to receive refills from our office moving forward, please arrange OV.

## 2020-10-17 NOTE — Telephone Encounter (Signed)
LMOM for pt to call office back 

## 2020-12-21 ENCOUNTER — Ambulatory Visit (HOSPITAL_COMMUNITY): Payer: BC Managed Care – PPO | Attending: Neurosurgery | Admitting: Physical Therapy

## 2020-12-21 ENCOUNTER — Other Ambulatory Visit: Payer: Self-pay

## 2020-12-21 ENCOUNTER — Encounter (HOSPITAL_COMMUNITY): Payer: Self-pay | Admitting: Physical Therapy

## 2020-12-21 DIAGNOSIS — M545 Low back pain, unspecified: Secondary | ICD-10-CM | POA: Diagnosis not present

## 2020-12-21 NOTE — Therapy (Signed)
[place], [name], [address] Phone: [phone]   Fax:  [phone]  Physical Therapy Evaluation  Patient Details  Name: PACEY WILLADSEN MRN: [id number] Date of Birth: [birth year] Referring Provider (PT): Kary Kos MD   Encounter Date: 12/21/2020   PT End of Session - 12/21/20 1157     Visit Number 1    Number of Visits 3    Date for PT Re-Evaluation 01/25/21   3 visits over 5 week POC   Authorization Type [employer] State health    PT Start Time 1115    PT Stop Time 1155    PT Time Calculation (min) 40 min    Activity Tolerance Other (comment);Patient limited by pain    Behavior During Therapy Agitated   Patient with poor tolerance overall, stating she is here for insurance olny to get approval for surgery. She does not believe therapy will work. 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%; right lumpectomy   Complication of anesthesia    SLOW TO WAKE UP   Depression    wellburtin   Diabetes mellitus    DM (diabetes mellitus) ([place]) 11/20/2010   Edema    hands and feet at times, takes as a direutic   Hot flashes secondary to Arimidex 11/20/2010   Hyperlipidemia    Hypothyroidism 11/20/2010   Synthroid   Thyroid disease    Varicose veins 2014    Past Surgical History:  Procedure Laterality Date   ABDOMINAL HYSTERECTOMY  2001   vaginal for endometriosis   axillary cyst  01/25/2009   AXILLARY NODE DISSECTION  02/23/2009 and 03/16/2009   BILATERAL OOPHORECTOMY  04/19/2009   BREAST REDUCTION SURGERY  1991   CARPAL TUNNEL RELEASE  04/06/1998   CARPAL TUNNEL RELEASE  [id number]   lt hand   CERVICAL FUSION  01/15/2007   COLONOSCOPY N/A 05/07/2013   Procedure: COLONOSCOPY;  Surgeon: Danie Binder, MD;  [facility];   Service: Endoscopy;  Laterality: N/A;  8:30 AM   COLONOSCOPY WITH PROPOFOL N/A 10/07/2018   Procedure: COLONOSCOPY WITH PROPOFOL;  Surgeon: Danie Binder, MD;  Location: [facility];  Service: Endoscopy;  Laterality: N/A;  1:15PM   DEBRIDEMENT TENNIS ELBOW  2003   PORT-A-CATH REMOVAL  01/16/2011   Procedure: REMOVAL PORT-A-CATH;  Surgeon: Stark Klein, MD;  Location: [facility];  Service: General;  Laterality: N/A;  Removal port-a-cath.   PORTACATH PLACEMENT  05/02/2009   RECTOCELE REPAIR  2012   rectocele surgery  03/07/10   TOTAL MASTECTOMY Bilateral 01/13/2015   Procedure: BILATERAL MASTECTOMY;  Surgeon: Stark Klein, MD;  Location: [facility];  Service: General;  Laterality: Bilateral;   trigger thumb surgery  02/01/2010   VEIN SURGERY  2005 / 2006 / 2014   BOTH LEGS    There were no vitals filed for this visit.    Subjective Assessment - 12/21/20 1120     Subjective Patient presents to therapy with complaint of low back pain. She notes history of 3 ruptured discs and has had this problem for several years. She denies previous treatment due to financial issues. She takes ibuprofen PRN.    Pertinent History Multi level disc bulge,  and spondylosis    Limitations Lifting;Standing;Walking    Diagnostic tests MRI    Patient Stated Goals Not sure    Currently in Pain? Yes    Pain Score 6     Pain Location Back    Pain Orientation Posterior;Right;Left    Pain Descriptors / Indicators Radiating;Sharp    Pain Type Chronic pain    Pain Radiating Towards Both legs    Pain Onset More than a month ago    Pain Frequency Constant    Aggravating Factors  working, standing, cleaning, sitting    Pain Relieving Factors Resting/ "nothing"    Effect of Pain on Daily Activities Limits                [facility] PT Assessment - 12/21/20 0001       Assessment   Medical Diagnosis LBP    Referring Provider (PT) Kary Kos MD    Onset Date/Surgical Date --   Chronic   Prior Therapy No      Precautions    Precautions None      Restrictions   Weight Bearing Restrictions No      Balance Screen   Has the patient fallen in the past 6 months Yes    How many times? 3    Has the patient had a decrease in activity level because of a fear of falling?  Yes    Is the patient reluctant to leave their home because of a fear of falling?  No      Home Ecologist residence      Prior Function   Level of Independence Independent    Vocation Full time employment    Vocation Requirements Custodian at [employer]   Overall Cognitive Status Within Functional Limits for tasks assessed      Posture/Postural Control   Posture/Postural Control Postural limitations    Postural Limitations Rounded Shoulders;Flexed trunk      ROM / Strength   AROM / PROM / Strength AROM;Strength      AROM   AROM Assessment Site Lumbar    Lumbar Flexion WFL    Lumbar Extension 90% limited    Lumbar - Right Side Bend 50% limited    Lumbar - Left Side Bend 50% limited    Lumbar - Right Rotation WFL    Lumbar - Left Rotation [facility]      Strength   Strength Assessment Site Hip;Knee    Right/Left Hip Right;Left    Right Hip Flexion 4+/5    Right Hip Extension 3-/5    Right Hip ABduction 4-/5    Left Hip Flexion 4+/5    Left Hip Extension 3-/5    Left Hip ABduction 4/5    Right/Left Knee Right;Left    Right Knee Extension 4+/5    Left Knee Extension 4/5      Palpation   Palpation comment Mod TTP about bilateral lumbar paraspinals (L3-5)                        Objective measurements completed on examination: See above findings.       [facility] [place] Adult PT Treatment/Exercise - 12/21/20 0001       Exercises   Exercises Lumbar      Lumbar Exercises: Supine   Ab Set 5 reps    Bent Knee Raise 5 reps   poorly tolerated, increased pain laying supine  PT Education - 12/21/20 1131     Education Details on evaluation  findings POC and HEP    Person(s) Educated Patient    Methods Explanation;Handout    Comprehension Verbalized understanding              PT Short Term Goals - 12/21/20 1201       PT SHORT TERM GOAL #1   Title Patient will be independent with initial HEP and self-management strategies to improve functional outcomes    Time 2    Period Weeks    Status New    Target Date 01/04/21               PT Long Term Goals - 12/21/20 1202       PT LONG TERM GOAL #1   Title Patient will be independent with advanced HEP and self-management strategies to improve functional outcomes    Time 5    Period Weeks    Status New    Target Date 01/25/21      PT LONG TERM GOAL #2   Title Patient will report at least 50% overall improvement in subjective complaint to indicate improvement in ability to perform ADLs.    Time 5    Period Weeks    Status New    Target Date 01/25/21                    Plan - 12/21/20 1158     Clinical Impression Statement Patient is a 60 y.o. female who presents to physical therapy with complaint of chronic LBP. Patient demonstrates decreased strength, ROM restriction, and postural abnormalities which are likely contributing to symptoms of pain and are negatively impacting patient ability to perform ADLs and functional mobility tasks. Patient will benefit from skilled physical therapy services to address these deficits to reduce pain, improve level of function with ADLs and functional mobility tasks. POC set for 3 visits over 5 week period due to expressed financial concern with copays.    Personal Factors and Comorbidities Behavior Pattern;Past/Current Experience;Time since onset of injury/illness/exacerbation    Examination-Activity Limitations Bed Mobility;Locomotion Level;Stand;Lift;Transfers;Carry;Bend    Examination-Participation Restrictions Laundry;Shop;Community Activity;Occupation;Cleaning;Yard Work;Meal Prep    Stability/Clinical Decision  Making Stable/Uncomplicated    Clinical Decision Making Low    Rehab Potential Fair    PT Frequency Other (comment)    PT Duration Other (comment)   3 visits total over 5 week period   PT Treatment/Interventions ADLs/Self Care Home Management;Biofeedback;Traction;Moist Heat;Stair training;Functional mobility training;Neuromuscular re-education;Manual techniques;Passive range of motion;Balance training;Scar mobilization;DME Instruction;Gait training;Iontophoresis 76m/ml Dexamethasone;Visual/perceptual remediation/compensation;Dry needling;Spinal Manipulations;Energy conservation;Splinting;Joint Manipulations;Taping;Compression bandaging;Patient/family education;Therapeutic activities;Therapeutic exercise;Orthotic Fit/Training;Vasopneumatic Device;Contrast Bath;Cryotherapy;Electrical Stimulation;Fluidtherapy;Parrafin;Ultrasound;Canalith Repostioning    PT Next Visit Plan Review HEP. Progress seated core strengthening and posturing as tolerated. scap retraction, hip abduction, adduction    PT Home Exercise Plan Eval: seated ab brace, ab march    Consulted and Agree with Plan of Care Patient             Patient will benefit from skilled therapeutic intervention in order to improve the following deficits and impairments:  Abnormal gait, Increased fascial restricitons, Improper body mechanics, Pain, Decreased mobility, Postural dysfunction, Decreased activity tolerance, Decreased range of motion, Decreased strength, Impaired perceived functional ability  Visit Diagnosis: Low back pain, unspecified back pain laterality, unspecified chronicity, unspecified whether sciatica present     Problem List Patient Active Problem List   Diagnosis Date Noted   Colon cancer screening 07/09/2018   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 01/13/2015 01/13/2015   Family history of colon cancer 09/02/2014   Personal history of breast cancer 09/02/2014   Family history of cancer  68/08/8108   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

## 2021-01-10 ENCOUNTER — Telehealth (HOSPITAL_COMMUNITY): Payer: Self-pay | Admitting: Physical Therapy

## 2021-01-10 NOTE — Telephone Encounter (Signed)
Patient requested to be d/c she can not afford to do PT at this time.

## 2021-01-11 ENCOUNTER — Ambulatory Visit (HOSPITAL_COMMUNITY): Payer: BC Managed Care – PPO

## 2021-06-15 ENCOUNTER — Emergency Department (HOSPITAL_COMMUNITY)
Admission: EM | Admit: 2021-06-15 | Discharge: 2021-06-15 | Disposition: A | Discharge status: Home / Self Care | Payer: No Typology Code available for payment source | Attending: Emergency Medicine | Admitting: Emergency Medicine

## 2021-06-15 ENCOUNTER — Emergency Department (HOSPITAL_COMMUNITY): Payer: BC Managed Care – PPO

## 2021-06-15 ENCOUNTER — Other Ambulatory Visit: Payer: Self-pay

## 2021-06-15 ENCOUNTER — Encounter (HOSPITAL_COMMUNITY): Payer: Self-pay | Admitting: Emergency Medicine

## 2021-06-15 DIAGNOSIS — Z794 Long term (current) use of insulin: Secondary | ICD-10-CM | POA: Insufficient documentation

## 2021-06-15 DIAGNOSIS — Z7982 Long term (current) use of aspirin: Secondary | ICD-10-CM | POA: Diagnosis not present

## 2021-06-15 DIAGNOSIS — Z7984 Long term (current) use of oral hypoglycemic drugs: Secondary | ICD-10-CM | POA: Diagnosis not present

## 2021-06-15 DIAGNOSIS — Y990 Civilian activity done for income or pay: Secondary | ICD-10-CM | POA: Insufficient documentation

## 2021-06-15 DIAGNOSIS — E119 Type 2 diabetes mellitus without complications: Secondary | ICD-10-CM | POA: Diagnosis not present

## 2021-06-15 DIAGNOSIS — Z853 Personal history of malignant neoplasm of breast: Secondary | ICD-10-CM | POA: Insufficient documentation

## 2021-06-15 DIAGNOSIS — S0083XA Contusion of other part of head, initial encounter: Secondary | ICD-10-CM | POA: Diagnosis not present

## 2021-06-15 DIAGNOSIS — S0990XA Unspecified injury of head, initial encounter: Secondary | ICD-10-CM | POA: Diagnosis present

## 2021-06-15 DIAGNOSIS — S060X9A Concussion with loss of consciousness of unspecified duration, initial encounter: Secondary | ICD-10-CM | POA: Insufficient documentation

## 2021-06-15 DIAGNOSIS — W01198A Fall on same level from slipping, tripping and stumbling with subsequent striking against other object, initial encounter: Secondary | ICD-10-CM | POA: Diagnosis not present

## 2021-06-15 LAB — BASIC METABOLIC PANEL
Anion gap: 11 (ref 5–15)
BUN: 23 mg/dL — ABNORMAL HIGH (ref 6–20)
CO2: 29 mmol/L (ref 22–32)
Calcium: 9.6 mg/dL (ref 8.9–10.3)
Chloride: 98 mmol/L (ref 98–111)
Creatinine, Ser: 0.72 mg/dL (ref 0.44–1.00)
GFR, Estimated: >60 mL/min (ref >60)
Glucose, Bld: 92 mg/dL (ref 70–99)
Potassium: 4.2 mmol/L (ref 3.5–5.1)
Sodium: 138 mmol/L (ref 135–145)

## 2021-06-15 LAB — CBC
HCT: 45.2 % (ref 36.0–46.0)
Hemoglobin: 14.8 g/dL (ref 12.0–15.0)
MCH: 29.2 pg (ref 26.0–34.0)
MCHC: 32.7 g/dL (ref 30.0–36.0)
MCV: 89.3 fL (ref 80.0–100.0)
Platelets: 259 10*3/uL (ref 150–400)
RBC: 5.06 MIL/uL (ref 3.87–5.11)
RDW: 13.3 % (ref 11.5–15.5)
WBC: 10.7 10*3/uL — ABNORMAL HIGH (ref 4.0–10.5)
nRBC: 0 % (ref 0.0–0.2)

## 2021-06-15 LAB — CBG MONITORING, ED: Glucose-Capillary: 92 mg/dL (ref 70–99)

## 2021-06-15 NOTE — ED Provider Notes (Signed)
[facility] EMERGENCY DEPARTMENT Provider Note   CSN: [id number] Arrival date & time: 06/15/21  1247     History  Chief Complaint  Patient presents with   Kelly Jacobson    Kelly Jacobson is a 61 y.o. female with history of thyroid disease, breast cancer, HLD, and diabetes who presents to the emergency department after a mechanical fall.  Patient states while at work she was trying to take the trash out to the dumpster, and believes that she tripped.  She is unsure if she struck her head on the dumpster or the ground.  She was able to get herself up and back inside, but unsure how long she was down for.  Patient herself does not have full memory of the event.  However, she does state that she was feeling well up until the event.  She no chest pain or lightheadedness.  Coworker at bedside states that she had at least 1 episode of emesis, and keeps complaining that she wants to sleep. Patient is not on chronic anticoagulation.    Fall Associated symptoms include headaches. Pertinent negatives include no chest pain.       Home Medications Prior to Admission medications   Medication Sig Start Date End Date Taking? Authorizing Provider  albuterol (VENTOLIN HFA) 108 (90 Base) MCG/ACT inhaler Inhale 1-2 puffs into the lungs every 6 (six) hours as needed for wheezing or shortness of breath. 01/14/20   Avegno, Darrelyn Hillock, FNP  ALPRAZolam Duanne Moron) 0.25 MG tablet Take 0.25 mg by mouth at bedtime as needed for anxiety.     [provider]  aspirin 81 MG tablet Take 81 mg by mouth daily.    [provider]  atorvastatin (LIPITOR) 20 MG tablet Take 20 mg by mouth daily.    [provider]  azithromycin (ZITHROMAX) 250 MG tablet Take 1 tablet (250 mg total) by mouth daily. Take first 2 tablets together, then 1 every day until finished. 01/14/20   Avegno, Darrelyn Hillock, FNP  benzonatate (TESSALON) 100 MG capsule Take 1 capsule (100 mg total) by mouth 3 (three) times daily as needed for  cough. 01/14/20   Avegno, Darrelyn Hillock, FNP  buPROPion (WELLBUTRIN SR) 200 MG 12 hr tablet Take 200 mg by mouth daily.    [provider]  Cholecalciferol (VITAMIN D) 50 MCG (2000 UT) CAPS Take 2,000 Units by mouth daily.     [provider]  empagliflozin (JARDIANCE) 25 MG TABS tablet Take 25 mg by mouth daily.     [provider]  ferrous sulfate 325 (65 FE) MG tablet Take 325 mg by mouth daily with breakfast.    [provider]  ibuprofen (ADVIL,MOTRIN) 800 MG tablet Take 800 mg by mouth every 8 (eight) hours as needed for moderate pain. pain    [provider]  Insulin Degludec (TRESIBA FLEXTOUCH) 200 UNIT/ML SOPN Inject 36 Units into the skin daily.     [provider]  levothyroxine (SYNTHROID, LEVOTHROID) 50 MCG tablet Take 50 mcg by mouth daily.      [provider]  metFORMIN (GLUCOPHAGE) 1000 MG tablet Take 1,000 mg by mouth daily with breakfast.     [provider]  Multiple Vitamin (MULTIVITAMIN WITH MINERALS) TABS tablet Take 1 tablet by mouth daily.    [provider]  omeprazole (PRILOSEC) 20 MG capsule 1 PO 30 MINS PRIOR TO BREAKFAST. 10/26/19   Annitta Needs, NP  Oyster Shell (OYSTER CALCIUM) 500 MG TABS tablet Take 500 mg  of elemental calcium by mouth daily.    [provider]  predniSONE (DELTASONE) 10 MG tablet Take 2 tablets (20 mg total) by mouth daily. 01/14/20   Avegno, Darrelyn Hillock, FNP  Semaglutide (OZEMPIC, 0.25 OR 0.5 MG/DOSE, ) Inject 0.5 mg into the skin every 7 (seven) days.     [provider]  telmisartan (MICARDIS) 40 MG tablet Take 40 mg by mouth daily.    [provider]  venlafaxine (EFFEXOR) 37.5 MG tablet Take 37.5 mg by mouth daily.    [provider]  vitamin E 400 UNIT capsule Take 400 Units by mouth daily.    [provider]  zolpidem (AMBIEN CR) 12.5 MG CR tablet Take 12.5 mg by mouth at bedtime as needed for sleep.  12/06/14   [provider]      Allergies    Tape and Propoxyphene    Review of Systems   Review of Systems  Eyes:  Negative for visual disturbance.  Cardiovascular:  Negative for chest pain.  Neurological:  Positive for headaches. Negative for dizziness, seizures, syncope, weakness, light-headedness and numbness.  All other systems reviewed and are negative.   Physical Exam Updated Vital Signs BP (!) 107/59   Pulse 65   Temp 97.7 F (36.5 C) (Oral)   Resp 18   Ht '5\' 11"'$  (1.803 m)   Wt 117.9 kg   SpO2 100%   BMI 36.26 kg/m  Physical Exam Vitals and nursing note reviewed.  Constitutional:      Appearance: Normal appearance.  HENT:     Head: Normocephalic.     Comments: Hematoma noted just above the left eyebrow, approximately 4 cm in diameter. No breaks in the skin. No raccoon eyes or battle signs.     Right Ear: Tympanic membrane, ear canal and external ear normal. No hemotympanum.     Left Ear: Tympanic membrane, ear canal and external ear normal. No hemotympanum.  Eyes:     Conjunctiva/sclera: Conjunctivae normal.  Cardiovascular:     Rate and Rhythm: Normal rate and regular rhythm.  Pulmonary:     Effort: Pulmonary effort is normal. No respiratory distress.     Breath sounds: Normal breath sounds.  Abdominal:     General: There is no distension.     Palpations: Abdomen is soft.     Tenderness: There is no abdominal tenderness.  Skin:    General: Skin is warm and dry.  Neurological:     General: No focal deficit present.     Mental Status: She is alert.     Comments: Neuro: Speech is clear, able to follow commands. CN III-XII intact grossly intact. PERRLA. EOMI. Sensation intact throughout. Str 5/5 all extremities.    ED Results / Procedures / Treatments   Labs (all labs ordered are listed, but only abnormal results are displayed) Labs Reviewed  BASIC METABOLIC PANEL - Abnormal; Notable for the following components:      Result Value   BUN 23 (*)    All other  components within normal limits  CBC - Abnormal; Notable for the following components:   WBC 10.7 (*)    All other components within normal limits  CBG MONITORING, ED    EKG None  Radiology CT Head Wo Contrast  Result Date: 06/15/2021 CLINICAL DATA:  Fall, hit head EXAM: CT HEAD WITHOUT CONTRAST TECHNIQUE: Contiguous axial images were obtained from the base of the skull through the vertex without intravenous contrast. RADIATION DOSE REDUCTION: This exam  was performed according to the departmental dose-optimization program which includes automated exposure control, adjustment of the mA and/or kV according to patient size and/or use of iterative reconstruction technique. COMPARISON:  None Available. FINDINGS: Brain: No evidence of acute infarction, hemorrhage, hydrocephalus, extra-axial collection or mass lesion/mass effect. Incidental note of cavum vergae variant of the lateral ventricles. Vascular: No hyperdense vessel or unexpected calcification. Skull: Normal. Negative for fracture or focal lesion. Sinuses/Orbits: No acute finding. Other: Soft tissue contusion of the left forehead (series 2, image 6). IMPRESSION: 1. No acute intracranial pathology. 2. Soft tissue contusion of the left forehead. Electronically Signed   By: Delanna Ahmadi M.D.   On: 06/15/2021 14:20    Procedures Procedures    Medications Ordered in ED Medications - No data to display  ED Course/ Medical Decision Making/ A&P                           Medical Decision Making Amount and/or Complexity of Data Reviewed Labs: ordered.  This patient is a 61 y.o. female  who presents to the ED for concern of head trauma after mechanical fall earlier today. No prodrome.  Differential diagnoses prior to evaluation: The emergent differential diagnosis includes, but is not limited to,  hematoma, intracranial hemorrhage, concussion.  This is not an exhaustive differential.   Past Medical History / Co-morbidities: Thyroid  disease, breast cancer, HLD, and diabetes  Physical Exam: Physical exam performed. The pertinent findings include: Normal neurologic exam as above.  4 cm hematoma noted over the left eyebrow, without skull deformity.  Lab Tests/Imaging studies: I Ordered, and personally interpreted labs/imaging and the pertinent results include: Soft tissue contusion of forehead without intracranial abnormality.  I agree with the radiologist interpretation.   Disposition: After consideration of the diagnostic results and the patients response to treatment, I feel that patient's is not requiring admission.  I think she likely sustained a concussion secondary to her fall.  We will treat symptomatically at home and encouraged follow-up with her PCP. Discussed reasons to return to the emergency department, and the patient is agreeable to the plan.   Final Clinical Impression(s) / ED Diagnoses Final diagnoses:  Injury of head, initial encounter  Concussion with loss of consciousness, initial encounter    Rx / DC Orders ED Discharge Orders     None      Portions of this report may have been transcribed using voice recognition software. Every effort was made to ensure accuracy; however, inadvertent computerized transcription errors may be present.    Estill Cotta 06/15/21 1442    Davonna Belling, MD 06/16/21 [phone]

## 2021-06-15 NOTE — Discharge Instructions (Addendum)
You are seen emergency department today after head injury.  As we discussed your imaging looked reassuring today.  I think you likely sustained a concussion. Most of these resolve within 3 weeks but symptoms can last longer than this. Take tylenol as needed as first line medication for headache.  Mental and physical rest are important. After a short period, light cardio (stationary bike, walking, light jogging) may be beneficial as long as it does not worsen your symptoms. Do not engage in activities that put you at risk of getting struck in the head.  Please follow up with your PCP within the next 1-2 weeks. If you develop persistent vomiting, weakness or numbness in arms/legs, loss of vision, worsening confusion (all of these are unusual in concussion), return to the ER.

## 2021-06-15 NOTE — ED Triage Notes (Signed)
Pt was taking out trash at work and lost footing falling forward striking head, hematoma noted left forehead.

## 2021-08-14 ENCOUNTER — Ambulatory Visit
Admission: EM | Admit: 2021-08-14 | Discharge: 2021-08-14 | Disposition: A | Discharge status: Home / Self Care | Payer: BC Managed Care – PPO | Attending: Nurse Practitioner | Admitting: Nurse Practitioner

## 2021-08-14 ENCOUNTER — Ambulatory Visit (INDEPENDENT_AMBULATORY_CARE_PROVIDER_SITE_OTHER): Payer: BC Managed Care – PPO

## 2021-08-14 DIAGNOSIS — Z23 Encounter for immunization: Secondary | ICD-10-CM | POA: Diagnosis not present

## 2021-08-14 DIAGNOSIS — M79645 Pain in left finger(s): Secondary | ICD-10-CM

## 2021-08-14 MED ORDER — SULFAMETHOXAZOLE-TRIMETHOPRIM 800-160 MG PO TABS: 1.0000 | ORAL_TABLET | Freq: Two times a day (BID) | ORAL | 0 refills | Status: AC

## 2021-08-14 MED ORDER — TETANUS-DIPHTH-ACELL PERTUSSIS 5-2.5-18.5 LF-MCG/0.5 IM SUSY
0.5000 mL | PREFILLED_SYRINGE | Freq: Once | INTRAMUSCULAR | Status: AC
  Administered 2021-08-14: 0.5 mL via INTRAMUSCULAR

## 2021-08-14 NOTE — ED Triage Notes (Signed)
Pt reports swelling, redness and pain in left ring finger x 1 week. Pt reports she has a piece of  wood.

## 2021-08-14 NOTE — ED Provider Notes (Signed)
[facility]    CSN: [id number] Arrival date & time: 08/14/21  1014      History   Chief Complaint Chief Complaint  Patient presents with   Hand Pain    HPI Kelly Jacobson is a 61 y.o. female.   Patient presents with a few days of left ring finger pain and concern for infection.  Reports the area has been red and painful for the past few days.  She works as a Sports coach and is concerned there may be a piece of wood or metal in her finger.  Reports overnight, the pain and redness has worsened.  Reports she is unable to fully bend her left ring finger like normal.  She has been trying warm soaks without relief.  Also reports she tried to "lance" her finger at home without much success.  She denies fevers, nausea/vomiting, numbness or tingling in her fingertips.    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%; right lumpectomy   Complication of anesthesia    SLOW TO WAKE UP   Depression    wellburtin   Diabetes mellitus    DM (diabetes mellitus) ([place]) 11/20/2010   Edema    hands and feet at times, takes as a direutic   Hot flashes secondary to Arimidex 11/20/2010   Hyperlipidemia    Hypothyroidism 11/20/2010   Synthroid   Thyroid disease    Varicose veins 2014    Patient Active Problem List   Diagnosis Date Noted   Colon cancer screening 07/09/2018   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 01/13/2015 01/13/2015   Family history of colon cancer 09/02/2014   Personal history of breast cancer 09/02/2014   Family history of cancer 45/99/7741   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   Genetic testing 08/30/2014   Hypothyroidism 11/20/2010   DM (diabetes mellitus) ([place]) 11/20/2010   Hot flashes secondary to Arimidex 11/20/2010   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    Past Surgical History:   Procedure Laterality Date   ABDOMINAL HYSTERECTOMY  2001   vaginal for endometriosis   axillary cyst  01/25/2009   AXILLARY NODE DISSECTION  02/23/2009 and 03/16/2009   BILATERAL OOPHORECTOMY  04/19/2009   BREAST REDUCTION SURGERY  1991   CARPAL TUNNEL RELEASE  04/06/1998   CARPAL TUNNEL RELEASE  [id number]   lt hand   CERVICAL FUSION  01/15/2007   COLONOSCOPY N/A 05/07/2013   Procedure: COLONOSCOPY;  Surgeon: Danie Binder, MD;  [facility];  Service: Endoscopy;  Laterality: N/A;  8:30 AM   COLONOSCOPY WITH PROPOFOL N/A 10/07/2018   Procedure: COLONOSCOPY WITH PROPOFOL;  Surgeon: Danie Binder, MD;  [facility];  Service: Endoscopy;  Laterality: N/A;  1:15PM   DEBRIDEMENT TENNIS ELBOW  2003   PORT-A-CATH REMOVAL  01/16/2011   Procedure: REMOVAL PORT-A-CATH;  Surgeon: Stark Klein, MD;  Location: [facility];  Service: General;  Laterality: N/A;  Removal port-a-cath.   PORTACATH PLACEMENT  05/02/2009   RECTOCELE REPAIR  2012   rectocele surgery  03/07/10   TOTAL MASTECTOMY Bilateral 01/13/2015   Procedure: BILATERAL MASTECTOMY;  Surgeon: Stark Klein, MD;  Location: [facility];  Service: General;  Laterality: Bilateral;   trigger thumb surgery  02/01/2010   VEIN SURGERY  2005 / 2006 / 2014   BOTH  LEGS    OB History   No obstetric history on file.      Home Medications    Prior to Admission medications   Medication Sig Start Date End Date Taking? Authorizing Provider  sulfamethoxazole-trimethoprim (BACTRIM DS) 800-160 MG tablet Take 1 tablet by mouth 2 (two) times daily for 7 days. 08/14/21 08/21/21 Yes Eulogio Bear, NP  albuterol (VENTOLIN HFA) 108 (90 Base) MCG/ACT inhaler Inhale 1-2 puffs into the lungs every 6 (six) hours as needed for wheezing or shortness of breath. 01/14/20   Avegno, Darrelyn Hillock, FNP  ALPRAZolam Duanne Moron) 0.25 MG tablet Take 0.25 mg by mouth at bedtime as needed for anxiety.     [provider]  aspirin 81 MG tablet Take 81 mg by mouth daily.     [provider]  atorvastatin (LIPITOR) 20 MG tablet Take 20 mg by mouth daily.    [provider]  benzonatate (TESSALON) 100 MG capsule Take 1 capsule (100 mg total) by mouth 3 (three) times daily as needed for cough. 01/14/20   Avegno, Darrelyn Hillock, FNP  buPROPion (WELLBUTRIN SR) 200 MG 12 hr tablet Take 200 mg by mouth daily.    [provider]  Cholecalciferol (VITAMIN D) 50 MCG (2000 UT) CAPS Take 2,000 Units by mouth daily.     [provider]  empagliflozin (JARDIANCE) 25 MG TABS tablet Take 25 mg by mouth daily.     [provider]  ferrous sulfate 325 (65 FE) MG tablet Take 325 mg by mouth daily with breakfast.    [provider]  ibuprofen (ADVIL,MOTRIN) 800 MG tablet Take 800 mg by mouth every 8 (eight) hours as needed for moderate pain. pain    [provider]  Insulin Degludec (TRESIBA FLEXTOUCH) 200 UNIT/ML SOPN Inject 36 Units into the skin daily.     [provider]  levothyroxine (SYNTHROID, LEVOTHROID) 50 MCG tablet Take 50 mcg by mouth daily.      [provider]  metFORMIN (GLUCOPHAGE) 1000 MG tablet Take 1,000 mg by mouth daily with breakfast.     [provider]  Multiple Vitamin (MULTIVITAMIN WITH MINERALS) TABS tablet Take 1 tablet by mouth daily.    [provider]  omeprazole (PRILOSEC) 20 MG capsule 1 PO 30 MINS PRIOR TO BREAKFAST. 10/26/19   Annitta Needs, NP  [place] (OYSTER CALCIUM) 500 MG TABS tablet Take 500 mg of elemental calcium by mouth daily.    [provider]  Semaglutide (OZEMPIC, 0.25 OR 0.5 MG/DOSE, [place]) Inject 0.5 mg into the skin every 7 (seven) days.     [provider]  telmisartan (MICARDIS) 40 MG tablet Take 40 mg by mouth daily.    [provider]  venlafaxine (EFFEXOR) 37.5 MG tablet Take 37.5 mg by mouth daily.    [provider]  vitamin E 400 UNIT capsule Take 400 Units by mouth daily.    [provider]   zolpidem (AMBIEN CR) 12.5 MG CR tablet Take 12.5 mg by mouth at bedtime as needed for sleep.  12/06/14   [provider]    Family History Family History  Problem Relation Age of Onset   Melanoma Father        dx. 72s   Heart attack Maternal Uncle    Other Paternal Aunt        hysterectomy   Diabetes Paternal Aunt    Cancer Paternal Aunt        unknown type  Colon cancer Paternal Grandmother        dx. 50s-60s   Heart attack Paternal Grandfather    Throat cancer Cousin 52   Prostate cancer Cousin 60   Kidney cancer Paternal Aunt        dx. late 50s-early 60s   Pancreatic cancer Other        (x2 paternal great aunts)   Brain cancer Other        unknown tumor type   Anesthesia problems Neg Hx    Hypotension Neg Hx    Malignant hyperthermia Neg Hx    Pseudochol deficiency Neg Hx     Social History Social History   Tobacco Use   Smoking status: Never   Smokeless tobacco: Never  Vaping Use   Vaping Use: Never used  Substance Use Topics   Alcohol use: Yes    Comment: Occasional - maybe 4 drinks per year   Drug use: No     Allergies   Tape and Propoxyphene   Review of Systems Review of Systems Per HPI  Physical Exam Triage Vital Signs ED Triage Vitals  Enc Vitals Group     BP 08/14/21 1050 107/69     Pulse Rate 08/14/21 1050 84     Resp 08/14/21 1050 18     Temp 08/14/21 1050 98 F (36.7 C)     Temp Source 08/14/21 1050 Oral     SpO2 08/14/21 1050 94 %     Weight --      Height --      Head Circumference --      Peak Flow --      Pain Score 08/14/21 1048 10     Pain Loc --      Pain Edu? --      Excl. in [place]? --    No data found.  Updated Vital Signs BP 107/69 (BP Location: Right Arm)   Pulse 84   Temp 98 F (36.7 C) (Oral)   Resp 18   SpO2 94%   Visual Acuity Right Eye Distance:   Left Eye Distance:   Bilateral Distance:    Right Eye Near:   Left Eye Near:    Bilateral Near:     Physical Exam Vitals and nursing note  reviewed.  Constitutional:      General: She is not in acute distress.    Appearance: Normal appearance. She is not ill-appearing or toxic-appearing.  HENT:     Head: Normocephalic and atraumatic.     Nose: Nose normal. No congestion.     Mouth/Throat:     Mouth: Mucous membranes are moist.     Pharynx: Oropharynx is clear.  Pulmonary:     Effort: Pulmonary effort is normal. No respiratory distress.  Musculoskeletal:        General: Swelling and tenderness present.       Hands:     Cervical back: Normal range of motion.     Comments: Inspection: Swelling, slight redness to the left fourth PIP joint as an area marked; no obvious deformities observed  Palpation: Left fourth PIP joint tender to palpation; no obvious deformities palpated ROM: Full ROM to fourth digit; unable to fully flex passively Strength: 5/5 left hand Neurovascular: neurovascularly intact in left upper extremity   Lymphadenopathy:     Cervical: No cervical adenopathy.  Skin:    General: Skin is warm and dry.     Capillary Refill: Capillary refill takes less than 2 seconds.  Coloration: Skin is not jaundiced or pale.     Findings: Erythema present.  Neurological:     Mental Status: She is alert and oriented to person, place, and time.  Psychiatric:        Behavior: Behavior is cooperative.      UC Treatments / Results  Labs (all labs ordered are listed, but only abnormal results are displayed) Labs Reviewed - No data to display  EKG   Radiology DG Finger Ring Left  Result Date: 08/14/2021 CLINICAL DATA:  Left ring finger pain and swelling. EXAM: LEFT RING FINGER 2+V COMPARISON:  None. FINDINGS: No evidence for an acute fracture. No subluxation or dislocation. No radiopaque soft tissue foreign body. IMPRESSION: Negative. Electronically Signed   By: Misty Stanley M.D.   On: 08/14/2021 11:20    Procedures Procedures (including critical care time)  Medications Ordered in UC Medications  Tdap  (BOOSTRIX) injection 0.5 mL (has no administration in time range)    Initial Impression / Assessment and Plan / UC Course  I have reviewed the triage vital signs and the nursing notes.  Pertinent labs & imaging results that were available during my care of the patient were reviewed by me and considered in my medical decision making (see chart for details).    Patient is a pleasant, well-appearing 61 year old female presenting for left ring finger pain and swelling.  X-ray imaging is negative for radiopaque soft tissue foreign body.  Discussed findings with patient.  Tdap updated.  Encouraged avoidance of continuing to attempt any removal of foreign body or "lancing" her finger at home.  Start on oral Bactrim DS twice daily for 7 days to treat underlying infection.  Continue warm soaks at least twice daily, keep area covered while at work.  Seek care if symptoms persist or worsen despite treatment. Final Clinical Impressions(s) / UC Diagnoses   Final diagnoses:  Pain of finger of left hand     Discharge Instructions      - The x-ray of your finger was negative for any foreign body today -Please start on the antibiotics, take them twice daily for 7 days.  Please also continue the warm soaks. -Please avoid picking at or messing with your finger to prevent worsening infection -If your symptoms worsen, go to the emergency room   ED Prescriptions     Medication Sig Dispense Auth. Provider   sulfamethoxazole-trimethoprim (BACTRIM DS) 800-160 MG tablet Take 1 tablet by mouth 2 (two) times daily for 7 days. 14 tablet Eulogio Bear, NP      PDMP not reviewed this encounter.   Eulogio Bear, NP 08/14/21 1216

## 2021-08-14 NOTE — Discharge Instructions (Addendum)
-   The x-ray of your finger was negative for any foreign body today -Please start on the antibiotics, take them twice daily for 7 days.  Please also continue the warm soaks. -Please avoid picking at or messing with your finger to prevent worsening infection -If your symptoms worsen, go to the emergency room

## 2021-10-06 ENCOUNTER — Ambulatory Visit
Admission: EM | Admit: 2021-10-06 | Discharge: 2021-10-06 | Disposition: A | Discharge status: Home / Self Care | Payer: BC Managed Care – PPO | Attending: Nurse Practitioner | Admitting: Nurse Practitioner

## 2021-10-06 DIAGNOSIS — S61012A Laceration without foreign body of left thumb without damage to nail, initial encounter: Secondary | ICD-10-CM | POA: Diagnosis not present

## 2021-10-06 NOTE — ED Provider Notes (Signed)
[facility]    CSN: [id number] Arrival date & time: 10/06/21  1104      History   Chief Complaint Chief Complaint  Patient presents with   Laceration    HPI Kelly Jacobson is a 61 y.o. female.   Patient presents with laceration to left thumb that occurred less than 1 hour ago while opening a box at work.  She reports a box cutter came down on her thumb.  She denies any numbness or tingling, extensive bleeding.  Reports she initially wrapped her hand in Kleenex to control the bleeding.  She is able to fully move the thumb in all 4 fingers.  Again no numbness or tingling, decreased sensation in the thumb.  Reports last tetanus shot was approximately 3 months ago.    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%; right lumpectomy   Complication of anesthesia    SLOW TO WAKE UP   Depression    wellburtin   Diabetes mellitus    DM (diabetes mellitus) ([place]) 11/20/2010   Edema    hands and feet at times, takes as a direutic   Hot flashes secondary to Arimidex 11/20/2010   Hyperlipidemia    Hypothyroidism 11/20/2010   Synthroid   Thyroid disease    Varicose veins 2014    Patient Active Problem List   Diagnosis Date Noted   Colon cancer screening 07/09/2018   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 01/13/2015 01/13/2015   Family history of colon cancer 09/02/2014   Personal history of breast cancer 09/02/2014   Family history of cancer 38/10/1749   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   Genetic testing 08/30/2014   Hypothyroidism 11/20/2010   DM (diabetes mellitus) ([place]) 11/20/2010   Hot flashes secondary to Arimidex 11/20/2010   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    Past Surgical History:  Procedure Laterality Date   ABDOMINAL HYSTERECTOMY  2001   vaginal for endometriosis    axillary cyst  01/25/2009   AXILLARY NODE DISSECTION  02/23/2009 and 03/16/2009   BILATERAL OOPHORECTOMY  04/19/2009   BREAST REDUCTION SURGERY  1991   CARPAL TUNNEL RELEASE  04/06/1998   CARPAL TUNNEL RELEASE  [id number]   lt hand   CERVICAL FUSION  01/15/2007   COLONOSCOPY N/A 05/07/2013   Procedure: COLONOSCOPY;  Surgeon: Danie Binder, MD;  [facility];  Service: Endoscopy;  Laterality: N/A;  8:30 AM   COLONOSCOPY WITH PROPOFOL N/A 10/07/2018   Procedure: COLONOSCOPY WITH PROPOFOL;  Surgeon: Danie Binder, MD;  [facility];  Service: Endoscopy;  Laterality: N/A;  1:15PM   DEBRIDEMENT TENNIS ELBOW  2003   PORT-A-CATH REMOVAL  01/16/2011   Procedure: REMOVAL PORT-A-CATH;  Surgeon: Stark Klein, MD;  Location: [facility];  Service: General;  Laterality: N/A;  Removal port-a-cath.   PORTACATH PLACEMENT  05/02/2009   RECTOCELE REPAIR  2012   rectocele surgery  03/07/10   TOTAL MASTECTOMY Bilateral 01/13/2015   Procedure: BILATERAL MASTECTOMY;  Surgeon: Stark Klein, MD;  [facility];  Service: General;  Laterality: Bilateral;   trigger thumb surgery  02/01/2010   VEIN SURGERY  2005 / 2006 / 2014   BOTH LEGS    OB History   No obstetric history on file.      Home Medications  Prior to Admission medications   Medication Sig Start Date End Date Taking? Authorizing Provider  albuterol (VENTOLIN HFA) 108 (90 Base) MCG/ACT inhaler Inhale 1-2 puffs into the lungs every 6 (six) hours as needed for wheezing or shortness of breath. 01/14/20   Avegno, Darrelyn Hillock, FNP  ALPRAZolam Duanne Moron) 0.25 MG tablet Take 0.25 mg by mouth at bedtime as needed for anxiety.     [provider]  aspirin 81 MG tablet Take 81 mg by mouth daily.    [provider]  atorvastatin (LIPITOR) 20 MG tablet Take 20 mg by mouth daily.    [provider]  benzonatate (TESSALON) 100 MG capsule Take 1 capsule (100 mg total) by mouth 3 (three) times daily as needed for cough. 01/14/20    Avegno, Darrelyn Hillock, FNP  buPROPion (WELLBUTRIN SR) 200 MG 12 hr tablet Take 200 mg by mouth daily.    [provider]  Cholecalciferol (VITAMIN D) 50 MCG (2000 UT) CAPS Take 2,000 Units by mouth daily.     [provider]  empagliflozin (JARDIANCE) 25 MG TABS tablet Take 25 mg by mouth daily.     [provider]  ferrous sulfate 325 (65 FE) MG tablet Take 325 mg by mouth daily with breakfast.    [provider]  ibuprofen (ADVIL,MOTRIN) 800 MG tablet Take 800 mg by mouth every 8 (eight) hours as needed for moderate pain. pain    [provider]  Insulin Degludec (TRESIBA FLEXTOUCH) 200 UNIT/ML SOPN Inject 36 Units into the skin daily.     [provider]  levothyroxine (SYNTHROID, LEVOTHROID) 50 MCG tablet Take 50 mcg by mouth daily.      [provider]  metFORMIN (GLUCOPHAGE) 1000 MG tablet Take 1,000 mg by mouth daily with breakfast.     [provider]  Multiple Vitamin (MULTIVITAMIN WITH MINERALS) TABS tablet Take 1 tablet by mouth daily.    [provider]  omeprazole (PRILOSEC) 20 MG capsule 1 PO 30 MINS PRIOR TO BREAKFAST. 10/26/19   Annitta Needs, NP  [place] (OYSTER CALCIUM) 500 MG TABS tablet Take 500 mg of elemental calcium by mouth daily.    [provider]  Semaglutide (OZEMPIC, 0.25 OR 0.5 MG/DOSE, [place]) Inject 0.5 mg into the skin every 7 (seven) days.     [provider]  telmisartan (MICARDIS) 40 MG tablet Take 40 mg by mouth daily.    [provider]  venlafaxine (EFFEXOR) 37.5 MG tablet Take 37.5 mg by mouth daily.    [provider]  vitamin E 400 UNIT capsule Take 400 Units by mouth daily.    [provider]  zolpidem (AMBIEN CR) 12.5 MG CR tablet Take 12.5 mg by mouth at bedtime as needed for sleep.  12/06/14   [provider]    Family History Family History  Problem Relation Age of Onset   Melanoma Father        dx. 12s   Heart  attack Maternal Uncle    Other Paternal Aunt        hysterectomy   Diabetes Paternal Aunt    Cancer Paternal Aunt        unknown type   Colon cancer Paternal Grandmother        dx. 50s-60s   Heart attack Paternal Grandfather    Throat cancer Cousin 18   Prostate cancer Cousin 60   Kidney cancer Paternal Aunt        dx. late 50s-early  60s   Pancreatic cancer Other        (x2 paternal great aunts)   Brain cancer Other        unknown tumor type   Anesthesia problems Neg Hx    Hypotension Neg Hx    Malignant hyperthermia Neg Hx    Pseudochol deficiency Neg Hx     Social History Social History   Tobacco Use   Smoking status: Never   Smokeless tobacco: Never  Vaping Use   Vaping Use: Never used  Substance Use Topics   Alcohol use: Yes    Comment: Occasional - maybe 4 drinks per year   Drug use: No     Allergies   Tape and Propoxyphene   Review of Systems Review of Systems Per HPI  Physical Exam Triage Vital Signs ED Triage Vitals  Enc Vitals Group     BP 10/06/21 1115 133/75     Pulse Rate 10/06/21 1115 84     Resp 10/06/21 1115 16     Temp 10/06/21 1115 97.8 F (36.6 C)     Temp Source 10/06/21 1115 Oral     SpO2 10/06/21 1115 94 %     Weight --      Height --      Head Circumference --      Peak Flow --      Pain Score 10/06/21 1114 5     Pain Loc --      Pain Edu? --      Excl. in [place]? --    No data found.  Updated Vital Signs BP 133/75 (BP Location: Right Arm)   Pulse 84   Temp 97.8 F (36.6 C) (Oral)   Resp 16   SpO2 94%   Visual Acuity Right Eye Distance:   Left Eye Distance:   Bilateral Distance:    Right Eye Near:   Left Eye Near:    Bilateral Near:     Physical Exam Vitals and nursing note reviewed.  Constitutional:      General: She is not in acute distress.    Appearance: Normal appearance. She is not toxic-appearing.  HENT:     Mouth/Throat:     Mouth: Mucous membranes are moist.     Pharynx: Oropharynx is clear.   Pulmonary:     Effort: Pulmonary effort is normal. No respiratory distress.  Musculoskeletal:     Left wrist: Normal. No swelling, tenderness, bony tenderness or snuff box tenderness. Normal range of motion. Normal pulse.     Left hand: Laceration present. No swelling, tenderness or bony tenderness. Normal range of motion. Normal strength. Normal sensation. There is no disruption of two-point discrimination. Normal capillary refill. Normal pulse.       Hands:  Skin:    General: Skin is warm and dry.     Capillary Refill: Capillary refill takes less than 2 seconds.     Coloration: Skin is not jaundiced or pale.     Findings: Laceration present. No erythema.  Neurological:     Mental Status: She is alert and oriented to person, place, and time.  Psychiatric:        Behavior: Behavior is cooperative.      UC Treatments / Results  Labs (all labs ordered are listed, but only abnormal results are displayed) Labs Reviewed - No data to display  EKG   Radiology No results found.  Procedures Laceration Repair  Date/Time: 10/06/2021 11:42 AM  Performed by: Eulogio Bear, NP Authorized  by: Eulogio Bear, NP   Consent:    Consent obtained:  Verbal   Consent given by:  Patient   Risks, benefits, and alternatives were discussed: yes     Risks discussed:  Infection, pain and poor wound healing   Alternatives discussed:  Delayed treatment Universal protocol:    Procedure explained and questions answered to patient or proxy's satisfaction: yes     Patient identity confirmed:  Verbally with patient Anesthesia:    Anesthesia method:  None Laceration details:    Location:  Finger   Finger location:  L thumb   Length (cm):  1.5 Exploration:    Hemostasis achieved with:  Direct pressure Treatment:    Area cleansed with:  Chlorhexidine   Amount of cleaning:  Standard Skin repair:    Repair method:  Tissue adhesive Approximation:    Approximation:  Close Repair type:     Repair type:  Simple Post-procedure details:    Dressing:  Adhesive bandage   Procedure completion:  Tolerated well, no immediate complications  (including critical care time)  Medications Ordered in UC Medications - No data to display  Initial Impression / Assessment and Plan / UC Course  I have reviewed the triage vital signs and the nursing notes.  Pertinent labs & imaging results that were available during my care of the patient were reviewed by me and considered in my medical decision making (see chart for details).    Patient is well-appearing, normotensive, afebrile, not tachycardic, not tachypneic, oxygenating well on room air.  Laceration repaired as above.  Wound care discussed.  Splint given and encouraged to wear for wound protection.  Signs and symptoms of infection discussed and encouraged to return if she develops these.  Last Tdap earlier this year.  ER precautions discussed.  The patient was given the opportunity to ask questions.  All questions answered to their satisfaction.  The patient is in agreement to this plan.    Final Clinical Impressions(s) / UC Diagnoses   Final diagnoses:  Laceration of left thumb without foreign body without damage to nail, initial encounter     Discharge Instructions      We have closed the laceration today with skin glue.  Please leave the first bandage on for 24 hours, then take off the bandage and you can shower like normal, just do not soak your thumb.  Wear the finger splint as much as you can to protect the skin that is glued  If you develop any signs of infection, please come back to see [place].     ED Prescriptions   None    PDMP not reviewed this encounter.   Eulogio Bear, NP 10/06/21 1201

## 2021-10-06 NOTE — ED Triage Notes (Signed)
Pt reports laceration in left thumb with a box cutter, happened 10 min ago.   Pt reports she had a Tdap 3 months ago.

## 2021-10-06 NOTE — Discharge Instructions (Signed)
We have closed the laceration today with skin glue.  Please leave the first bandage on for 24 hours, then take off the bandage and you can shower like normal, just do not soak your thumb.  Wear the finger splint as much as you can to protect the skin that is glued  If you develop any signs of infection, please come back to see [place].

## 2021-11-02 ENCOUNTER — Ambulatory Visit
Admission: RE | Admit: 2021-11-02 | Discharge: 2021-11-02 | Disposition: A | Discharge status: Home / Self Care | Payer: BC Managed Care – PPO | Source: Ambulatory Visit | Attending: Family Medicine | Admitting: Family Medicine

## 2021-11-02 ENCOUNTER — Other Ambulatory Visit: Payer: Self-pay

## 2021-11-02 VITALS — BP 102/67 | HR 99 | Temp 98.5°F | Resp 22

## 2021-11-02 DIAGNOSIS — J0100 Acute maxillary sinusitis, unspecified: Secondary | ICD-10-CM | POA: Diagnosis not present

## 2021-11-02 MED ORDER — AMOXICILLIN-POT CLAVULANATE 875-125 MG PO TABS: 1.0000 | ORAL_TABLET | Freq: Two times a day (BID) | ORAL | 0 refills | Status: AC

## 2021-11-02 MED ORDER — FLUTICASONE PROPIONATE 50 MCG/ACT NA SUSP: 1.0000 | Freq: Two times a day (BID) | NASAL | 2 refills | Status: AC

## 2021-11-02 MED ORDER — PROMETHAZINE-DM 6.25-15 MG/5ML PO SYRP: 5.0000 mL | ORAL_SOLUTION | Freq: Four times a day (QID) | ORAL | 0 refills | Status: AC | PRN

## 2021-11-02 NOTE — ED Provider Notes (Signed)
[facility]    CSN: [id number] Arrival date & time: 11/02/21  1448      History   Chief Complaint Chief Complaint  Patient presents with   Cough    Entered by patient   Appointment    10:00    HPI Kelly Jacobson is a 61 y.o. female.   Patient presenting today with 4-day history of cough, congestion, sinus pressure, headache, scratchy throat.  Denies fever, chills, chest pain, shortness of breath, abdominal pain, nausea vomiting or diarrhea.  So far taking Mucinex DM with mild temporary relief of symptoms.  States she is prone to sinus infections and this feels consistent with that.    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%; right lumpectomy   Complication of anesthesia    SLOW TO WAKE UP   Depression    wellburtin   Diabetes mellitus    DM (diabetes mellitus) ([place]) 11/20/2010   Edema    hands and feet at times, takes as a direutic   Hot flashes secondary to Arimidex 11/20/2010   Hyperlipidemia    Hypothyroidism 11/20/2010   Synthroid   Thyroid disease    Varicose veins 2014    Patient Active Problem List   Diagnosis Date Noted   Colon cancer screening 07/09/2018   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 01/13/2015 01/13/2015   Family history of colon cancer 09/02/2014   Personal history of breast cancer 09/02/2014   Family history of cancer 18/56/3149   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   Genetic testing 08/30/2014   Hypothyroidism 11/20/2010   DM (diabetes mellitus) ([place]) 11/20/2010   Hot flashes secondary to Arimidex 11/20/2010   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    Past Surgical History:  Procedure Laterality Date   ABDOMINAL HYSTERECTOMY  2001   vaginal for endometriosis   axillary cyst  01/25/2009   AXILLARY NODE DISSECTION  02/23/2009 and 03/16/2009    BILATERAL OOPHORECTOMY  04/19/2009   BREAST REDUCTION SURGERY  1991   CARPAL TUNNEL RELEASE  04/06/1998   CARPAL TUNNEL RELEASE  [id number]   lt hand   CERVICAL FUSION  01/15/2007   COLONOSCOPY N/A 05/07/2013   Procedure: COLONOSCOPY;  Surgeon: Danie Binder, MD;  [facility];  Service: Endoscopy;  Laterality: N/A;  8:30 AM   COLONOSCOPY WITH PROPOFOL N/A 10/07/2018   Procedure: COLONOSCOPY WITH PROPOFOL;  Surgeon: Danie Binder, MD;  [facility];  Service: Endoscopy;  Laterality: N/A;  1:15PM   DEBRIDEMENT TENNIS ELBOW  2003   PORT-A-CATH REMOVAL  01/16/2011   Procedure: REMOVAL PORT-A-CATH;  Surgeon: Stark Klein, MD;  Location: [place] [facility];  Service: General;  Laterality: N/A;  Removal port-a-cath.   PORTACATH PLACEMENT  05/02/2009   RECTOCELE REPAIR  2012   rectocele surgery  03/07/10   TOTAL MASTECTOMY Bilateral 01/13/2015   Procedure: BILATERAL MASTECTOMY;  Surgeon: Stark Klein, MD;  [facility];  Service: General;  Laterality: Bilateral;   trigger thumb surgery  02/01/2010   VEIN SURGERY  2005 / 2006 / 2014   BOTH LEGS    OB History   No obstetric history on file.      Home Medications    Prior to Admission medications   Medication Sig Start Date End Date Taking? Authorizing Provider  amoxicillin-clavulanate (  AUGMENTIN) 875-125 MG tablet Take 1 tablet by mouth every 12 (twelve) hours. 11/02/21  Yes Volney American, PA-C  fluticasone [facility]) 50 MCG/ACT nasal spray Place 1 spray into both nostrils 2 (two) times daily. 11/02/21  Yes Volney American, PA-C  promethazine-dextromethorphan (PROMETHAZINE-DM) 6.25-15 MG/5ML syrup Take 5 mLs by mouth 4 (four) times daily as needed. 11/02/21  Yes Volney American, PA-C  albuterol (VENTOLIN HFA) 108 (90 Base) MCG/ACT inhaler Inhale 1-2 puffs into the lungs every 6 (six) hours as needed for wheezing or shortness of breath. 01/14/20   Avegno, Darrelyn Hillock, FNP  ALPRAZolam Duanne Moron) 0.25 MG tablet Take 0.25 mg by  mouth at bedtime as needed for anxiety.     [provider]  aspirin 81 MG tablet Take 81 mg by mouth daily.    [provider]  atorvastatin (LIPITOR) 20 MG tablet Take 20 mg by mouth daily.    [provider]  benzonatate (TESSALON) 100 MG capsule Take 1 capsule (100 mg total) by mouth 3 (three) times daily as needed for cough. 01/14/20   Avegno, Darrelyn Hillock, FNP  buPROPion (WELLBUTRIN SR) 200 MG 12 hr tablet Take 200 mg by mouth daily.    [provider]  Cholecalciferol (VITAMIN D) 50 MCG (2000 UT) CAPS Take 2,000 Units by mouth daily.     [provider]  empagliflozin (JARDIANCE) 25 MG TABS tablet Take 25 mg by mouth daily.     [provider]  ferrous sulfate 325 (65 FE) MG tablet Take 325 mg by mouth daily with breakfast.    [provider]  ibuprofen (ADVIL,MOTRIN) 800 MG tablet Take 800 mg by mouth every 8 (eight) hours as needed for moderate pain. pain    [provider]  Insulin Degludec (TRESIBA FLEXTOUCH) 200 UNIT/ML SOPN Inject 36 Units into the skin daily.     [provider]  levothyroxine (SYNTHROID, LEVOTHROID) 50 MCG tablet Take 50 mcg by mouth daily.      [provider]  metFORMIN (GLUCOPHAGE) 1000 MG tablet Take 1,000 mg by mouth daily with breakfast.     [provider]  Multiple Vitamin (MULTIVITAMIN WITH MINERALS) TABS tablet Take 1 tablet by mouth daily.    [provider]  omeprazole (PRILOSEC) 20 MG capsule 1 PO 30 MINS PRIOR TO BREAKFAST. 10/26/19   Annitta Needs, NP  [place] (OYSTER CALCIUM) 500 MG TABS tablet Take 500 mg of elemental calcium by mouth daily.    [provider]  Semaglutide (OZEMPIC, 0.25 OR 0.5 MG/DOSE, Hypoluxo) Inject 0.5 mg into the skin every 7 (seven) days.     [provider]  telmisartan (MICARDIS) 40 MG tablet Take 40 mg by mouth daily.    [provider]  venlafaxine (EFFEXOR) 37.5 MG tablet Take 37.5 mg by mouth  daily.    [provider]  vitamin E 400 UNIT capsule Take 400 Units by mouth daily.    [provider]  zolpidem (AMBIEN CR) 12.5 MG CR tablet Take 12.5 mg by mouth at bedtime as needed for sleep.  12/06/14   [provider]    Family History Family History  Problem Relation Age of Onset   Melanoma Father        dx. 19s   Colon cancer Paternal Grandmother        dx. 50s-60s   Heart attack Paternal Grandfather    Heart attack Maternal Uncle    Other Paternal Aunt  hysterectomy   Diabetes Paternal Aunt    Cancer Paternal Aunt        unknown type   Kidney cancer Paternal Aunt        dx. late 50s-early 60s   Throat cancer Cousin 61   Prostate cancer Cousin 60   Pancreatic cancer Other        (x2 paternal great aunts)   Brain cancer Other        unknown tumor type   Anesthesia problems Neg Hx    Hypotension Neg Hx    Malignant hyperthermia Neg Hx    Pseudochol deficiency Neg Hx     Social History Social History   Tobacco Use   Smoking status: Never   Smokeless tobacco: Never  Vaping Use   Vaping Use: Never used  Substance Use Topics   Alcohol use: Yes    Comment: Occasional - maybe 4 drinks per year   Drug use: No     Allergies   Tape and Propoxyphene   Review of Systems Review of Systems Per HPI  Physical Exam Triage Vital Signs ED Triage Vitals  Enc Vitals Group     BP 11/02/21 1014 102/67     Pulse Rate 11/02/21 1014 99     Resp 11/02/21 1014 (!) 22     Temp 11/02/21 1014 98.5 F (36.9 C)     Temp Source 11/02/21 1014 Oral     SpO2 11/02/21 1014 96 %     Weight --      Height --      Head Circumference --      Peak Flow --      Pain Score 11/02/21 1009 2     Pain Loc --      Pain Edu? --      Excl. in [place]? --    No data found.  Updated Vital Signs BP 102/67 (BP Location: Left Arm)   Pulse 99   Temp 98.5 F (36.9 C) (Oral)   Resp (!) 22   SpO2 96%   Visual Acuity Right Eye Distance:   Left Eye  Distance:   Bilateral Distance:    Right Eye Near:   Left Eye Near:    Bilateral Near:     Physical Exam Vitals and nursing note reviewed.  Constitutional:      Appearance: Normal appearance.  HENT:     Head: Atraumatic.     Right Ear: Tympanic membrane and external ear normal.     Left Ear: Tympanic membrane and external ear normal.     Nose: Rhinorrhea present.     Mouth/Throat:     Mouth: Mucous membranes are moist.     Pharynx: Posterior oropharyngeal erythema present.  Eyes:     Extraocular Movements: Extraocular movements intact.     Conjunctiva/sclera: Conjunctivae normal.  Cardiovascular:     Rate and Rhythm: Normal rate and regular rhythm.     Heart sounds: Normal heart sounds.  Pulmonary:     Effort: Pulmonary effort is normal.     Breath sounds: Normal breath sounds. No wheezing or rales.  Musculoskeletal:        General: Normal range of motion.     Cervical back: Normal range of motion and neck supple.  Skin:    General: Skin is warm and dry.  Neurological:     Mental Status: She is alert and oriented to person, place, and time.  Psychiatric:        Mood and Affect:  Mood normal.        Thought Content: Thought content normal.    UC Treatments / Results  Labs (all labs ordered are listed, but only abnormal results are displayed) Labs Reviewed - No data to display  EKG   Radiology No results found.  Procedures Procedures (including critical care time)  Medications Ordered in UC Medications - No data to display  Initial Impression / Assessment and Plan / UC Course  I have reviewed the triage vital signs and the nursing notes.  Pertinent labs & imaging results that were available during my care of the patient were reviewed by me and considered in my medical decision making (see chart for details).     Suspect viral sinusitis, recommended adding Flonase nasal spray twice daily, saline sinus rinses, Phenergan DM cough syrup.  Supportive home care  and other over-the-counter remedies reviewed.  If worsening over the next 4 to 5 days have sent over an antibiotic that she may start.  Work note given.  Return for worsening symptoms.  Final Clinical Impressions(s) / UC Diagnoses   Final diagnoses:  Acute non-recurrent maxillary sinusitis     Discharge Instructions      I recommend adding Flonase nasal spray, saline sinus rinses several times daily and a good cough syrup to see if this resolves her symptoms over the next few days.  If your symptoms are worsening or not resolving after 4 to 5 days of this you may start the antibiotic that was sent over.    ED Prescriptions     Medication Sig Dispense Auth. Provider   amoxicillin-clavulanate (AUGMENTIN) 875-125 MG tablet Take 1 tablet by mouth every 12 (twelve) hours. 14 tablet Volney American, PA-C   fluticasone [facility]) 50 MCG/ACT nasal spray Place 1 spray into both nostrils 2 (two) times daily. [place], [place]   promethazine-dextromethorphan (PROMETHAZINE-DM) 6.25-15 MG/5ML syrup Take 5 mLs by mouth 4 (four) times daily as needed. 100 mL Volney American, [place]      PDMP not reviewed this encounter.   Volney American, [place] 11/02/21 1040

## 2021-11-02 NOTE — Discharge Instructions (Signed)
I recommend adding Flonase nasal spray, saline sinus rinses several times daily and a good cough syrup to see if this resolves her symptoms over the next few days.  If your symptoms are worsening or not resolving after 4 to 5 days of this you may start the antibiotic that was sent over.

## 2021-11-02 NOTE — ED Triage Notes (Signed)
Symptoms started 10/29/2021.  Has been taking mucinex dm.    Patient reports cough, congestion, headache, and sore throat.  Home covid test was done 10/29/2021 and reported as negative

## 2021-12-14 ENCOUNTER — Encounter (HOSPITAL_COMMUNITY): Payer: Self-pay | Admitting: Physical Therapy

## 2021-12-14 NOTE — Therapy (Signed)
[place], [name], [address] Phone: [phone]   Fax:  [phone]  Patient Details  Name: Kelly Jacobson MRN: [id number] Date of Birth: 1960-04-21 Referring Provider:  No ref. provider found  Encounter Date: 12/14/2021  PHYSICAL THERAPY DISCHARGE SUMMARY  Visits from Start of Care: 1  Current functional level related to goals / functional outcomes: NA   Remaining deficits: NA   Education / Equipment: Patient DC per self request    Patient agrees to discharge. Patient goals were not met. Patient is being discharged due to the patient's request.  Elizbeth Squires, PT 12/14/2021, 11:25 AM  [place], [name], [address] Phone: [phone]   Fax:  [phone]

## 2022-02-27 ENCOUNTER — Other Ambulatory Visit: Payer: Self-pay | Admitting: Internal Medicine

## 2022-02-27 DIAGNOSIS — R1011 Right upper quadrant pain: Secondary | ICD-10-CM

## 2022-03-26 ENCOUNTER — Ambulatory Visit
Admission: RE | Admit: 2022-03-26 | Discharge: 2022-03-26 | Disposition: A | Discharge status: Home / Self Care | Payer: BC Managed Care – PPO | Source: Ambulatory Visit | Attending: Internal Medicine | Admitting: Internal Medicine

## 2022-03-26 DIAGNOSIS — R1011 Right upper quadrant pain: Secondary | ICD-10-CM

## 2022-04-04 ENCOUNTER — Other Ambulatory Visit: Payer: Self-pay

## 2022-04-04 ENCOUNTER — Ambulatory Visit (HOSPITAL_COMMUNITY): Payer: BC Managed Care – PPO | Attending: Internal Medicine | Admitting: Physical Therapy

## 2022-04-04 ENCOUNTER — Encounter (HOSPITAL_COMMUNITY): Payer: Self-pay | Admitting: Physical Therapy

## 2022-04-04 DIAGNOSIS — R2689 Other abnormalities of gait and mobility: Secondary | ICD-10-CM | POA: Diagnosis present

## 2022-04-04 DIAGNOSIS — M5459 Other low back pain: Secondary | ICD-10-CM | POA: Insufficient documentation

## 2022-04-04 DIAGNOSIS — R29898 Other symptoms and signs involving the musculoskeletal system: Secondary | ICD-10-CM | POA: Diagnosis present

## 2022-04-04 DIAGNOSIS — M6281 Muscle weakness (generalized): Secondary | ICD-10-CM | POA: Insufficient documentation

## 2022-04-04 NOTE — Therapy (Signed)
OUTPATIENT PHYSICAL THERAPY LOWER EXTREMITY EVALUATION   Patient Name: Kelly Jacobson MRN: [id number] DOB:[birth year], 62 y.o., female Today's Date: 04/04/2022  END OF SESSION:  PT End of Session - 04/04/22 1433     Visit Number 1    Number of Visits 6    Date for PT Re-Evaluation 05/16/22    Authorization Type BCBS    PT Start Time 1433    PT Stop Time 1520    PT Time Calculation (min) 47 min    Activity Tolerance Patient tolerated treatment well    Behavior During Therapy WFL for tasks assessed/performed             Past Medical History:  Diagnosis Date   Anxiety    Xanax   Arthritis    Hands and legs   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; ER/PR+, Her2-, 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%; right lumpectomy   Complication of anesthesia    SLOW TO WAKE UP   Depression    wellburtin   Diabetes mellitus    DM (diabetes mellitus) ([place]) 11/20/2010   Edema    hands and feet at times, takes as a direutic   Hot flashes secondary to Arimidex 11/20/2010   Hyperlipidemia    Hypothyroidism 11/20/2010   Synthroid   Thyroid disease    Varicose veins 2014   Past Surgical History:  Procedure Laterality Date   ABDOMINAL HYSTERECTOMY  2001   vaginal for endometriosis   axillary cyst  01/25/2009   AXILLARY NODE DISSECTION  02/23/2009 and 03/16/2009   BILATERAL OOPHORECTOMY  04/19/2009   BREAST REDUCTION SURGERY  1991   CARPAL TUNNEL RELEASE  04/06/1998   CARPAL TUNNEL RELEASE  [id number]   lt hand   CERVICAL FUSION  01/15/2007   COLONOSCOPY N/A 05/07/2013   Procedure: COLONOSCOPY;  Surgeon: Danie Binder, MD;  [facility];  Service: Endoscopy;  Laterality: N/A;  8:30 AM   COLONOSCOPY WITH PROPOFOL N/A 10/07/2018   Procedure: COLONOSCOPY WITH PROPOFOL;  Surgeon: Danie Binder, MD;  [facility];  Service: Endoscopy;  Laterality: N/A;  1:15PM   DEBRIDEMENT TENNIS ELBOW  2003   PORT-A-CATH REMOVAL  01/16/2011   Procedure: REMOVAL PORT-A-CATH;  Surgeon: Stark Klein, MD;   Location: [facility];  Service: General;  Laterality: N/A;  Removal port-a-cath.   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 01/13/2015 01/13/2015   Family history of colon cancer 09/02/2014   Personal history of breast cancer 09/02/2014   Family history of cancer [id number]   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   Genetic testing 08/30/2014   Hypothyroidism 11/20/2010   DM (diabetes mellitus) ([place]) 11/20/2010   Hot flashes secondary to Arimidex 11/20/2010   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 Had fall over the weekend walking up ramp. Fell 4-5 times at work 1-2 x at home. Not sure what causes them. Denies dizziness, orthostatics. Back bothers her with lifting, pushing.   PERTINENT HISTORY: Hypertension, Spondylolisthesis, lumbar region, increased BMI, Hx breast cancer, DM, HLD, anxiety, depression  PAIN:  Are you having pain? No  PRECAUTIONS: Fall  WEIGHT BEARING RESTRICTIONS: No  FALLS:  Has patient fallen in last 6 months? Yes. Number of falls  6-7  OCCUPATION: Custodian   PLOF: Independent  PATIENT GOALS: stop falling   OBJECTIVE:    COGNITION: Overall cognitive status: Within functional limits for tasks assessed     SENSATION: WFL    POSTURE: rounded shoulders, forward head, and flexed trunk     LOWER EXTREMITY ROM: WFL for tasks assessed   LOWER EXTREMITY MMT:  MMT Right eval Left eval  Hip flexion 5 5  Hip extension 3+ 3+  Hip abduction 4- 4-  Hip adduction    Hip internal rotation    Hip external rotation    Knee flexion 5 5  Knee extension 5 5  Ankle dorsiflexion 5 5  Ankle plantarflexion    Ankle inversion    Ankle eversion     (Blank rows = not tested)    FUNCTIONAL TESTS:  5 times sit to stand: 13.13 seconds without UE support 2 minute walk test: 435 feet SLS: 17 seconds RLE; >30 seconds LLE Stairs: decreased control ascending/descending, no UE support, 7 inch  GAIT: Distance walked: 435 feet Assistive device utilized: None Level of assistance: Complete Independence Comments: 2MWT, trunk flexed, decreased arm swing bilaterally DGI 1. Gait level surface (3) Normal: Walks 20', no assistive devices, good sped, no evidence for imbalance, normal gait pattern 2. Change in gait speed (3) Normal: Able to smoothly change walking speed without loss of balance or gait deviation. Shows a significant difference in walking speeds between normal, fast and slow speeds. 3. Gait with horizontal head turns (3) Normal: Performs head turns smoothly with no change in gait. 4. Gait with vertical head turns (3) Normal: Performs head turns smoothly with no change in gait. 5. Gait and pivot turn (3) Normal: Pivot turns safely within 3 seconds and stops quickly with no loss of balance. 6. Step over obstacle (3) Normal: Is able to step over the box without changing gait speed, no evidence of imbalance. 7. Step around obstacles (3) Normal: Is able to walk around cones safely without changing gait speed;  no evidence of imbalance. 8. Stairs (3) Normal: Alternating feet, no rail.  TOTAL SCORE: 24 / 24   TODAY'S TREATMENT:  DATE:  04/04/22 Bridge  2 x 10   Standing lumbar extension 1 x 10    PATIENT EDUCATION:  Education details: Patient educated on exam findings, POC, scope of PT, HEP, and posture. Person educated: Patient Education method: Explanation, Demonstration, and Handouts Education comprehension: verbalized understanding, returned demonstration, verbal cues required, and tactile cues required  HOME EXERCISE PROGRAM: Access Code: [email or web address] URL: [email or web address] Date: 04/04/2022 Prepared by: Mitzi Hansen Marvelle Caudill  Exercises - Supine Bridge  - 2 x daily - 7 x weekly - 2 sets - 10 reps - Standing Lumbar Extension with Counter  - 2-3 x daily - 7 x weekly - 1-2 sets - 10 reps  ASSESSMENT:  CLINICAL IMPRESSION: Patient a 62 y.o. y.o. female who was seen today for physical therapy evaluation and treatment for recurrent falls. Patient presents with pain limited deficits in lumbar spine and bilateral LE strength, posture, ROM, endurance, activity tolerance, gait, balance, and functional mobility with ADL. Patient LBP, posture, core and gluteal weakness likely contributing to falls. Patient is having to modify and restrict ADL as indicated by outcome measure score as well as subjective information and objective measures which is affecting overall participation. Patient will benefit from skilled physical therapy in order to improve function and reduce impairment.  OBJECTIVE IMPAIRMENTS: Abnormal gait, decreased activity tolerance, decreased balance, decreased mobility, difficulty walking, decreased ROM, decreased strength, increased muscle spasms, impaired flexibility, improper body mechanics, postural dysfunction, and pain.   ACTIVITY LIMITATIONS:  carrying, lifting, bending, standing, squatting, locomotion level, and caring for others  PARTICIPATION LIMITATIONS: meal prep, cleaning, laundry, shopping, community activity, occupation, and yard work  PERSONAL FACTORS: Time since onset of injury/illness/exacerbation and 3+ comorbidities: Hypertension, Spondylolisthesis, lumbar region, increased BMI, Hx breast cancer, DM, HLD, anxiety, depression  are also affecting patient's functional outcome.   REHAB POTENTIAL: Good  CLINICAL DECISION MAKING: Stable/uncomplicated  EVALUATION COMPLEXITY: Low   GOALS: Goals reviewed with patient? Yes  SHORT TERM GOALS: Target date: 04/25/2022    Patient will be independent with HEP in order to improve functional outcomes. Baseline: Goal status: INITIAL  2.  Patient will report at least 25% improvement in symptoms for improved quality of life. Baseline: Goal status: INITIAL    LONG TERM GOALS: Target date: 05/16/2022    Patient will report at least 75% improvement in symptoms for improved quality of life. Baseline:  Goal status: INITIAL  2.  Patient will demonstrate grade of 5/5 MMT grade in all tested musculature as evidence of improved strength to assist with stair ambulation and gait.  Baseline: see above Goal status: INITIAL  3.  Patient will be able to navigate stairs with reciprocal pattern without compensation in order to demonstrate improved LE strength. Baseline:  Goal status: INITIAL  4.  Patient will be able to perform SLS for at least >30 seconds bilaterally to demonstrate improved balance and LE strength. Baseline:  Goal status: INITIAL  5.  Patient will be able to complete 5x STS in under 11.4 seconds in order to reduce the risk of falls. Baseline:  Goal status: INITIAL  6.  Patient will report no falls in last month to demonstrate improved balance and posture to reuduce risk for injury. Baseline: falls about 1x/month Goal status: INITIAL   PLAN:  PT FREQUENCY:  1x/week  PT DURATION: 6 weeks  PLANNED INTERVENTIONS: Therapeutic exercises, Therapeutic activity, Neuromuscular re-education, Balance training, Gait training, Patient/Family education, Joint manipulation, Joint mobilization, Stair training, Orthotic/Fit training, DME instructions, Aquatic Therapy, Dry Needling, Electrical stimulation, Spinal manipulation, Spinal  mobilization, Cryotherapy, Moist heat, Compression bandaging, scar mobilization, Splintting, Taping, Traction, Ultrasound, Ionotophoresis 4mg /ml Dexamethasone, and Manual therapy  PLAN FOR NEXT SESSION: core and glute strength, postural strength, f/u with HEP and lumbar extension   Vianne Bulls Artemus Romanoff, PT 04/04/2022, 3:28 PM

## 2022-04-12 ENCOUNTER — Encounter (HOSPITAL_COMMUNITY): Payer: BC Managed Care – PPO

## 2022-04-17 ENCOUNTER — Telehealth (HOSPITAL_COMMUNITY): Payer: Self-pay

## 2022-04-17 ENCOUNTER — Encounter (HOSPITAL_COMMUNITY): Payer: BC Managed Care – PPO

## 2022-04-17 NOTE — Telephone Encounter (Signed)
No show, called and left message concerning missed apt. Reminded next apt date and time wiht contact information included if needs to cancel/reschedule in the future   Becky Sax, LPTA/CLT; CBIS [phone]

## 2022-04-24 ENCOUNTER — Encounter (HOSPITAL_COMMUNITY): Payer: BC Managed Care – PPO

## 2022-05-01 ENCOUNTER — Encounter (HOSPITAL_COMMUNITY): Payer: BC Managed Care – PPO

## 2022-05-08 ENCOUNTER — Encounter (HOSPITAL_COMMUNITY): Payer: BC Managed Care – PPO

## 2022-05-15 ENCOUNTER — Encounter (HOSPITAL_COMMUNITY): Payer: BC Managed Care – PPO | Admitting: Physical Therapy

## 2023-10-18 ENCOUNTER — Telehealth: Payer: Self-pay | Admitting: Gastroenterology

## 2023-10-18 NOTE — Telephone Encounter (Signed)
 Patient may be accepted to our practice. Please schedule next available [facility]oscopy for this patient. Thanks. GM

## 2023-10-18 NOTE — Telephone Encounter (Signed)
 Good afternoon Dr. [place],   I received a call from this patient is regards to her referral. This patient is needing to be schedule to have a colonoscopy for Genetic susceptibility to other malignant neoplasm. Patient was being seen with [facility] about 5 years ago. Patient had a last colonoscopy back on September of 2020. Patient records can be view in epic. Would you please advise on how to schedule this patient.   Thank you.

## 2023-10-30 ENCOUNTER — Telehealth: Payer: Self-pay | Admitting: Gastroenterology

## 2023-10-30 NOTE — Telephone Encounter (Signed)
 Good morning Dr. Stacia,    I received a call from this patient requesting to schedule for a colonoscopy due to her having a referral for Genetic susceptibility to other malignant neoplasm. Patient has been seen in [facility]. Patient has also had a colonoscopy done 5 years years ago. Patient has requesting to have her colonoscopy done by the end of this year. Would you please advise on how to schedule this patient.    Thank you.

## 2023-11-11 ENCOUNTER — Encounter: Payer: Self-pay | Admitting: Gastroenterology

## 2023-11-12 NOTE — Telephone Encounter (Signed)
 Error

## 2023-12-10 ENCOUNTER — Ambulatory Visit: Payer: Self-pay

## 2023-12-10 VITALS — Ht 71.0 in | Wt 270.0 lb

## 2023-12-10 DIAGNOSIS — Z1589 Genetic susceptibility to other disease: Secondary | ICD-10-CM

## 2023-12-10 MED ORDER — NA SULFATE-K SULFATE-MG SULF 17.5-3.13-1.6 GM/177ML PO SOLN: 1.0000 | Freq: Once | ORAL | 0 refills | Status: AC

## 2023-12-10 NOTE — Progress Notes (Signed)
 PCP MD at time of PV: Elsie Gentry, MD  __________________________________________________________________________________________________________________________________________  No egg allergy known to patient  No soy allergy known to patient No issues known to pt with past sedation with any surgeries or procedures Patient denies ever being told they had issues or difficulty with intubation  No FH of Malignant Hyperthermia Pt is not on diet pills Pt is not on  home 02  Pt is not on blood thinners  No A fib or A flutter Have any cardiac testing pending-- no  LOA: independent  No Chew or Snuff tobacco __________________________________________________________________________________________________________________________________________  Constipation: no Prep: suprep  __________________________________________________________________________________________________________________________________________  PV completed with patient. Prep instructions reviewed and provided during apt. Rx sent to preferred pharmacy.  __________________________________________________________________________________________________________________________________________  Patient's chart reviewed by Norleen Schillings CNRA prior to previsit and patient appropriate for the [facility].  Previsit completed and red dot placed by patient's name on their procedure day (on provider's schedule).

## 2023-12-18 ENCOUNTER — Encounter: Payer: Self-pay | Admitting: Gastroenterology

## 2023-12-25 ENCOUNTER — Encounter: Payer: Self-pay | Admitting: Gastroenterology

## 2023-12-25 ENCOUNTER — Ambulatory Visit: Payer: Self-pay | Admitting: Gastroenterology

## 2023-12-25 VITALS — BP 152/77 | HR 87 | Temp 97.9°F | Resp 16 | Ht 71.0 in | Wt 270.0 lb

## 2023-12-25 DIAGNOSIS — K5730 Diverticulosis of large intestine without perforation or abscess without bleeding: Secondary | ICD-10-CM | POA: Diagnosis not present

## 2023-12-25 DIAGNOSIS — K644 Residual hemorrhoidal skin tags: Secondary | ICD-10-CM

## 2023-12-25 DIAGNOSIS — K635 Polyp of colon: Secondary | ICD-10-CM | POA: Diagnosis present

## 2023-12-25 DIAGNOSIS — D123 Benign neoplasm of transverse colon: Secondary | ICD-10-CM

## 2023-12-25 DIAGNOSIS — K562 Volvulus: Secondary | ICD-10-CM

## 2023-12-25 DIAGNOSIS — D127 Benign neoplasm of rectosigmoid junction: Secondary | ICD-10-CM

## 2023-12-25 DIAGNOSIS — K641 Second degree hemorrhoids: Secondary | ICD-10-CM | POA: Diagnosis not present

## 2023-12-25 DIAGNOSIS — Z1589 Genetic susceptibility to other disease: Secondary | ICD-10-CM

## 2023-12-25 MED ORDER — SODIUM CHLORIDE 0.9 % IV SOLN: 500.0000 mL | Freq: Once | INTRAVENOUS | Status: DC

## 2023-12-25 NOTE — Progress Notes (Signed)
 GASTROENTEROLOGY PROCEDURE H&P NOTE   Primary Care Physician: Loreli Elsie JONETTA Mickey., MD  HPI: Kelly Jacobson is a 63 y.o. 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%; right lumpectomy   Complication of anesthesia    SLOW TO WAKE UP   Depression    wellburtin   Diabetes mellitus    DM (diabetes mellitus) (HCC) 11/20/2010   Edema    hands and feet at times, takes as a direutic   Hot flashes secondary to Arimidex 11/20/2010   Hyperlipidemia    Hypothyroidism 11/20/2010   Synthroid    Thyroid  disease    Varicose veins 2014   Past Surgical History:  Procedure Laterality Date   ABDOMINAL HYSTERECTOMY  2001   vaginal for endometriosis   axillary cyst  01/25/2009   AXILLARY NODE DISSECTION  02/23/2009 and 03/16/2009   BILATERAL OOPHORECTOMY  04/19/2009   BREAST REDUCTION SURGERY  1991   CARPAL TUNNEL RELEASE  04/06/1998   CARPAL TUNNEL RELEASE  [id number]   lt hand   CERVICAL FUSION  01/15/2007   COLONOSCOPY N/A 05/07/2013   Procedure: COLONOSCOPY;  Surgeon: Margo LITTIE Haddock, MD;  Location: [facility];  Service: Endoscopy;  Laterality: N/A;  8:30 AM   COLONOSCOPY WITH PROPOFOL  N/A 10/07/2018   Procedure: COLONOSCOPY WITH PROPOFOL ;  Surgeon: Haddock Margo LITTIE, MD;  Location: [facility];  Service: Endoscopy;  Laterality: N/A;  1:15PM   DEBRIDEMENT TENNIS ELBOW  2003   PORT-A-CATH REMOVAL  01/16/2011   Procedure: REMOVAL PORT-A-CATH;  Surgeon: Jina Nephew, MD;  Location: [facility];  Service: General;  Laterality: N/A;  Removal port-a-cath.   PORTACATH PLACEMENT  05/02/2009   RECTOCELE REPAIR  2012   rectocele surgery  03/07/10   TOTAL MASTECTOMY Bilateral 01/13/2015   Procedure: BILATERAL MASTECTOMY;  Surgeon: Jina Nephew, MD;  Location: [facility];  Service: General;  Laterality: Bilateral;   trigger thumb surgery  02/01/2010   VEIN SURGERY  2005 / 2006 / 2014   BOTH LEGS    Current Outpatient Medications  Medication Sig Dispense Refill   Continuous Glucose Sensor (FREESTYLE LIBRE 3 PLUS SENSOR) MISC CHANGE SENSOR TOPICALLY EVERY 15 DAYS, TO MONITOR BLOOD GLUCOSE     EMBECTA PEN NEEDLE NANO 32G X 4 MM MISC SMARTSIG:injection     Na Sulfate-K Sulfate-Mg Sulfate concentrate (SUPREP) 17.5-3.13-1.6 GM/177ML SOLN Mix Contents in 1 gallon of water  the day before your procedure. Keep in the fridge until ready to use. Start drinking when instructed, drink 8 oz every 15-20 minutes until finished as instructed by doctor.     albuterol  (VENTOLIN  HFA) 108 (90 Base) MCG/ACT inhaler Inhale 1-2 puffs into the lungs every 6 (six) hours as needed for wheezing or shortness of breath. 18 g 0   ALPRAZolam  (XANAX ) 0.25 MG tablet Take 0.25 mg by mouth at bedtime as needed for anxiety.      amoxicillin -clavulanate (AUGMENTIN ) 875-125 MG tablet Take 1 tablet by mouth every 12 (twelve) hours. (Patient not taking: Reported on 12/10/2023) 14 tablet 0   ARIPiprazole (ABILIFY) 5 MG tablet Take 5 mg by mouth daily.     aspirin 81 MG tablet Take 81 mg by mouth daily.     atorvastatin  (LIPITOR) 20 MG tablet Take 20 mg by mouth daily.     benzonatate  (TESSALON ) 100 MG capsule Take 1 capsule (  100 mg total) by mouth 3 (three) times daily as needed for cough. 30 capsule 0   buPROPion  (WELLBUTRIN  XL) 300 MG 24 hr tablet Take 300 mg by mouth every morning.     Cholecalciferol (VITAMIN D ) 50 MCG (2000 UT) CAPS Take 2,000 Units by mouth daily.      empagliflozin (JARDIANCE) 25 MG TABS tablet Take 25 mg by mouth daily.      ferrous sulfate 325 (65 FE) MG tablet Take 325 mg by mouth daily with breakfast.     fluticasone  (FLONASE ) 50 MCG/ACT nasal spray Place 1 spray into both nostrils 2 (two) times daily. 16 g 2   ibuprofen  (ADVIL ,MOTRIN ) 800 MG tablet Take 800 mg by mouth every 8 (eight) hours as needed for moderate pain. pain     Insulin  Degludec (TRESIBA FLEXTOUCH) 200 UNIT/ML SOPN Inject 36 Units into  the skin daily.      levothyroxine  (SYNTHROID , LEVOTHROID) 50 MCG tablet Take 50 mcg by mouth daily.       methocarbamol  (ROBAXIN ) 500 MG tablet Take 500 mg by mouth every 8 (eight) hours as needed.     Multiple Vitamin (MULTIVITAMIN WITH MINERALS) TABS tablet Take 1 tablet by mouth daily.     omeprazole  (PRILOSEC) 20 MG capsule 1 PO 30 MINS PRIOR TO BREAKFAST. 90 capsule 3   Oyster Shell (OYSTER CALCIUM ) 500 MG TABS tablet Take 500 mg of elemental calcium  by mouth daily.     OZEMPIC, 2 MG/DOSE, 8 MG/3ML SOPN Inject 2 mg into the skin once a week.     promethazine -dextromethorphan (PROMETHAZINE -DM) 6.25-15 MG/5ML syrup Take 5 mLs by mouth 4 (four) times daily as needed. (Patient not taking: Reported on 12/10/2023) 100 mL 0   telmisartan (MICARDIS) 40 MG tablet Take 40 mg by mouth daily.     telmisartan-hydrochlorothiazide (MICARDIS HCT) 40-12.5 MG tablet Take 1 tablet by mouth daily.     venlafaxine  XR (EFFEXOR -XR) 150 MG 24 hr capsule Take 150 mg by mouth daily.     vitamin E 400 UNIT capsule Take 400 Units by mouth daily.     zolpidem  (AMBIEN  CR) 12.5 MG CR tablet Take 12.5 mg by mouth at bedtime as needed for sleep.      No current facility-administered medications for this visit.   Facility-Administered Medications Ordered in Other Visits  Medication Dose Route Frequency Provider Last Rate Last Admin   sodium chloride  0.9 % injection 10 mL  10 mL Intravenous PRN Kefalas, Thomas S, PA-C   10 mL at 11/20/10 1326   Current Medications[1] Allergies[2] Family History  Problem Relation Age of Onset   Melanoma Father        dx. 60s   Colon cancer Paternal Grandmother        dx. 50s-60s   Heart attack Paternal Grandfather    Heart attack Maternal Uncle    Other Paternal Aunt        hysterectomy   Diabetes Paternal Aunt    Cancer Paternal Aunt        unknown type   Kidney cancer Paternal Aunt        dx. late 50s-early 60s   Throat cancer Cousin 45   Prostate cancer Cousin 53    Pancreatic cancer Other        (x2 paternal great aunts)   Brain cancer Other        unknown tumor type   Anesthesia problems Neg Hx    Hypotension Neg Hx    Malignant hyperthermia Neg  Hx    Pseudochol deficiency Neg Hx    Social History   Socioeconomic History   Marital status: Single    Spouse name: Not on file   Number of children: Not on file   Years of education: Not on file   Highest education level: Not on file  Occupational History   Not on file  Tobacco Use   Smoking status: Never   Smokeless tobacco: Never  Vaping Use   Vaping status: Never Used  Substance and Sexual Activity   Alcohol use: Yes    Comment: Occasional - maybe 4 drinks per year   Drug use: No   Sexual activity: Not Currently  Other Topics Concern   Not on file  Social History Narrative   Not on file   Social Drivers of Health   Tobacco Use: Low Risk (12/10/2023)   Patient History    Smoking Tobacco Use: Never    Smokeless Tobacco Use: Never    Passive Exposure: Not on file  Financial Resource Strain: Not on file  Food Insecurity: Not on file  Transportation Needs: Not on file  Physical Activity: Not on file  Stress: Not on file  Social Connections: Not on file  Intimate Partner Violence: Not on file  Depression (EYV7-0): Not on file  Alcohol Screen: Not on file  Housing: Not on file  Utilities: Not on file  Health Literacy: Not on file    Physical Exam: There were no vitals filed for this visit. There is no height or weight on file to calculate BMI. GEN: NAD EYE: Sclerae anicteric ENT: MMM CV: Non-tachycardic GI: Soft, NT/ND NEURO:  Alert & Oriented x 3  Lab Results: No results for input(s): WBC, HGB, HCT, PLT in the last 72 hours. BMET No results for input(s): NA, K, CL, CO2, GLUCOSE, BUN, CREATININE, CALCIUM  in the last 72 hours. LFT No results for input(s): PROT, ALBUMIN, AST, ALT, ALKPHOS, BILITOT, BILIDIR, IBILI in the last 72  hours. PT/INR No results for input(s): LABPROT, INR in the last 72 hours.   Impression / Plan: This is a 63 y.o.female who presents for   The risks and benefits of endoscopic evaluation/treatment were discussed with the patient and/or family; these include but are not limited to the risk of perforation, infection, bleeding, missed lesions, lack of diagnosis, severe illness requiring hospitalization, as well as anesthesia and sedation related illnesses.  The patient's history has been reviewed, patient examined, no change in status, and deemed stable for procedure.  The patient and/or family was provided an opportunity to ask questions and all were answered.  The patient and/or family is agreeable to proceed.    Aloha Finner, MD [facility] Office # [phone]     [1]  Current Outpatient Medications:    Continuous Glucose Sensor (FREESTYLE LIBRE 3 PLUS SENSOR) MISC, CHANGE SENSOR TOPICALLY EVERY 15 DAYS, TO MONITOR BLOOD GLUCOSE, Disp: , Rfl:    EMBECTA PEN NEEDLE NANO 32G X 4 MM MISC, SMARTSIG:injection, Disp: , Rfl:    Na Sulfate-K Sulfate-Mg Sulfate concentrate (SUPREP) 17.5-3.13-1.6 GM/177ML SOLN, Mix Contents in 1 gallon of water  the day before your procedure. Keep in the fridge until ready to use. Start drinking when instructed, drink 8 oz every 15-20 minutes until finished as instructed by doctor., Disp: , Rfl:    albuterol  (VENTOLIN  HFA) 108 (90 Base) MCG/ACT inhaler, Inhale 1-2 puffs into the lungs every 6 (six) hours as needed for wheezing or shortness of breath., Disp: 18 g,  Rfl: 0   ALPRAZolam  (XANAX ) 0.25 MG tablet, Take 0.25 mg by mouth at bedtime as needed for anxiety. , Disp: , Rfl:    amoxicillin -clavulanate (AUGMENTIN ) 875-125 MG tablet, Take 1 tablet by mouth every 12 (twelve) hours. (Patient not taking: Reported on 12/10/2023), Disp: 14 tablet, Rfl: 0   ARIPiprazole (ABILIFY) 5 MG tablet, Take 5 mg by mouth daily., Disp: , Rfl:     aspirin 81 MG tablet, Take 81 mg by mouth daily., Disp: , Rfl:    atorvastatin  (LIPITOR) 20 MG tablet, Take 20 mg by mouth daily., Disp: , Rfl:    benzonatate  (TESSALON ) 100 MG capsule, Take 1 capsule (100 mg total) by mouth 3 (three) times daily as needed for cough., Disp: 30 capsule, Rfl: 0   buPROPion  (WELLBUTRIN  XL) 300 MG 24 hr tablet, Take 300 mg by mouth every morning., Disp: , Rfl:    Cholecalciferol (VITAMIN D ) 50 MCG (2000 UT) CAPS, Take 2,000 Units by mouth daily. , Disp: , Rfl:    empagliflozin (JARDIANCE) 25 MG TABS tablet, Take 25 mg by mouth daily. , Disp: , Rfl:    ferrous sulfate 325 (65 FE) MG tablet, Take 325 mg by mouth daily with breakfast., Disp: , Rfl:    fluticasone  (FLONASE ) 50 MCG/ACT nasal spray, Place 1 spray into both nostrils 2 (two) times daily., Disp: 16 g, Rfl: 2   ibuprofen  (ADVIL ,MOTRIN ) 800 MG tablet, Take 800 mg by mouth every 8 (eight) hours as needed for moderate pain. pain, Disp: , Rfl:    Insulin  Degludec (TRESIBA FLEXTOUCH) 200 UNIT/ML SOPN, Inject 36 Units into the skin daily. , Disp: , Rfl:    levothyroxine  (SYNTHROID , LEVOTHROID) 50 MCG tablet, Take 50 mcg by mouth daily.  , Disp: , Rfl:    methocarbamol  (ROBAXIN ) 500 MG tablet, Take 500 mg by mouth every 8 (eight) hours as needed., Disp: , Rfl:    Multiple Vitamin (MULTIVITAMIN WITH MINERALS) TABS tablet, Take 1 tablet by mouth daily., Disp: , Rfl:    omeprazole  (PRILOSEC) 20 MG capsule, 1 PO 30 MINS PRIOR TO BREAKFAST., Disp: 90 capsule, Rfl: 3   Oyster Shell (OYSTER CALCIUM ) 500 MG TABS tablet, Take 500 mg of elemental calcium  by mouth daily., Disp: , Rfl:    OZEMPIC, 2 MG/DOSE, 8 MG/3ML SOPN, Inject 2 mg into the skin once a week., Disp: , Rfl:    promethazine -dextromethorphan (PROMETHAZINE -DM) 6.25-15 MG/5ML syrup, Take 5 mLs by mouth 4 (four) times daily as needed. (Patient not taking: Reported on 12/10/2023), Disp: 100 mL, Rfl: 0   telmisartan (MICARDIS) 40 MG tablet, Take 40 mg by mouth daily.,  Disp: , Rfl:    telmisartan-hydrochlorothiazide (MICARDIS HCT) 40-12.5 MG tablet, Take 1 tablet by mouth daily., Disp: , Rfl:    venlafaxine  XR (EFFEXOR -XR) 150 MG 24 hr capsule, Take 150 mg by mouth daily., Disp: , Rfl:    vitamin E 400 UNIT capsule, Take 400 Units by mouth daily., Disp: , Rfl:    zolpidem  (AMBIEN  CR) 12.5 MG CR tablet, Take 12.5 mg by mouth at bedtime as needed for sleep. , Disp: , Rfl:  No current facility-administered medications for this visit.  Facility-Administered Medications Ordered in Other Visits:    sodium chloride  0.9 % injection 10 mL, 10 mL, Intravenous, PRN, Kefalas, Thomas S, PA-C, 10 mL at 11/20/10 1326 [2]  Allergies Allergen Reactions   Tape Hives   Atorvastatin  Calcium  Other (See Comments)   Lisinopril Cough   Losartan Other (See Comments)  Other Reaction(s): uknown   Propoxyphene Other (See Comments)    hallucination

## 2023-12-25 NOTE — Op Note (Signed)
 [facility] Patient Name: Kelly Jacobson Procedure Date: 12/25/2023 1:13 PM MRN: [id number] Endoscopist: Aloha Finner , MD, [id number] Age: 63 Referring MD:  Date of Birth: 12-15-1960 Gender: Female Account #: [id number] Procedure:                Colonoscopy Indications:              CHEK2 Mutation Medicines:                Monitored Anesthesia Care Procedure:                Pre-Anesthesia Assessment:                           - Prior to the procedure, a History and Physical                            was performed, and patient medications and                            allergies were reviewed. The patient's tolerance of                            previous anesthesia was also reviewed. The risks                            and benefits of the procedure and the sedation                            options and risks were discussed with the patient.                            All questions were answered, and informed consent                            was obtained. Prior Anticoagulants: The patient has                            taken no anticoagulant or antiplatelet agents                            except for aspirin and has taken no anticoagulant                            or antiplatelet agents except for NSAID medication.                            ASA Grade Assessment: II - A patient with mild                            systemic disease. After reviewing the risks and                            benefits, the patient was deemed in satisfactory  condition to undergo the procedure.                           After obtaining informed consent, the colonoscope                            was passed under direct vision. Throughout the                            procedure, the patient's blood pressure, pulse, and                            oxygen saturations were monitored continuously. The                            CF [id number] #[id number] was introduced  through the anus                            and advanced to the 3 cm into the ileum. The                            colonoscopy was performed without difficulty. The                            patient tolerated the procedure. The quality of the                            bowel preparation was adequate. The terminal ileum,                            ileocecal valve, appendiceal orifice, and rectum                            were photographed. Scope In: 1:21:59 PM Scope Out: 1:38:38 PM Scope Withdrawal Time: 0 hours 12 minutes 39 seconds  Total Procedure Duration: 0 hours 16 minutes 39 seconds  Findings:                 The digital rectal exam findings include                            hemorrhoids. Pertinent negatives include no                            palpable rectal lesions.                           A large amount of semi-liquid stool was found in                            the entire colon, interfering with visualization.                            Lavage of the area was performed using copious  amounts, resulting in clearance with adequate                            visualization.                           The colon (entire examined portion) revealed                            moderately excessive looping.                           The terminal ileum and ileocecal valve appeared                            normal.                           Three sessile polyps were found in the                            recto-sigmoid colon (1) and transverse colon (2).                            The polyps were 3 to 6 mm in size. These polyps                            were removed with a cold snare. Resection and                            retrieval were complete.                           Multiple small-mouthed diverticula were found in                            the recto-sigmoid colon and sigmoid colon.                           Normal mucosa was found in the entire  colon                            otherwise.                           Non-bleeding non-thrombosed external and internal                            hemorrhoids were found during retroflexion, during                            perianal exam and during digital exam. The                            hemorrhoids were Grade II (internal hemorrhoids  that prolapse but reduce spontaneously). Complications:            No immediate complications. Estimated Blood Loss:     Estimated blood loss was minimal. Impression:               - Hemorrhoids found on digital rectal exam.                           - Stool in the entire examined colon. Lavaged with                            adequate visualization.                           - There was significant looping of the colon.                           - The examined portion of the ileum was normal.                           - Three 3 to 6 mm polyps at the recto-sigmoid colon                            and in the transverse colon, removed with a cold                            snare. Resected and retrieved.                           - Diverticulosis in the recto-sigmoid colon and in                            the sigmoid colon.                           - Normal mucosa in the entire examined colon                            otherwise.                           - Non-bleeding non-thrombosed internal/external                            hemorrhoids. Recommendation:           - The patient will be observed post-procedure,                            until all discharge criteria are met.                           - Discharge patient to home.                           - Patient has a contact number available for  emergencies. The signs and symptoms of potential                            delayed complications were discussed with the                            patient. Return to normal activities tomorrow.                             Written discharge instructions were provided to the                            patient.                           - High fiber diet.                           - Use FiberCon 1-2 tablets PO daily.                           - Continue present medications.                           - Await pathology results.                           - Repeat colonoscopy in 3 - 5 years for                            surveillance (pending pathology). Due to genetic                            predisposition should never go more than 5-years                            between procedures. Use abdominal binder and 2-day                            preparation v 1 week Miralax/Dulcolax daily prior                            to 1-day prep.                           - The findings and recommendations were discussed                            with the patient.                           - The findings and recommendations were discussed                            with the patient's family. Aloha Finner, MD 12/25/2023 1:46:47 PM

## 2023-12-25 NOTE — Progress Notes (Signed)
 Called to room to assist during endoscopic procedure.  Patient ID and intended procedure confirmed with present staff. Received instructions for my participation in the procedure from the performing physician.

## 2023-12-25 NOTE — Progress Notes (Signed)
 Report given to PACU, vss

## 2023-12-25 NOTE — Progress Notes (Signed)
 Pt's states no medical or surgical changes since previsit or office visit.

## 2023-12-25 NOTE — Patient Instructions (Signed)
 High fiber diet. Use FiberCon 1-2 tablets by mouth daily. Continue present medications.  Awaiting pathology results.  Repeat colonoscopy in 3-5 years for surveillance (pending pathology).  Handouts provided on polyps, diverticulosis, and hemorrhoids.   YOU HAD AN ENDOSCOPIC PROCEDURE TODAY AT THE [facility]:   Refer to the procedure report that was given to you for any specific questions about what was found during the examination.  If the procedure report does not answer your questions, please call your gastroenterologist to clarify.  If you requested that your care partner not be given the details of your procedure findings, then the procedure report has been included in a sealed envelope for you to review at your convenience later.  YOU SHOULD EXPECT: Some feelings of bloating in the abdomen. Passage of more gas than usual.  Walking can help get rid of the air that was put into your GI tract during the procedure and reduce the bloating. If you had a lower endoscopy (such as a colonoscopy or flexible sigmoidoscopy) you may notice spotting of blood in your stool or on the toilet paper. If you underwent a bowel prep for your procedure, you may not have a normal bowel movement for a few days.  Please Note:  You might notice some irritation and congestion in your nose or some drainage.  This is from the oxygen used during your procedure.  There is no need for concern and it should clear up in a day or so.  SYMPTOMS TO REPORT IMMEDIATELY:  Following lower endoscopy (colonoscopy or flexible sigmoidoscopy):  Excessive amounts of blood in the stool  Significant tenderness or worsening of abdominal pains  Swelling of the abdomen that is new, acute  Fever of 100F or higher  For urgent or emergent issues, a gastroenterologist can be reached at any hour by calling [phone]. Do not use MyChart messaging for urgent concerns.    DIET:  We do recommend a small meal at first, but then  you may proceed to your regular diet.  Drink plenty of fluids but you should avoid alcoholic beverages for 24 hours.  ACTIVITY:  You should plan to take it easy for the rest of today and you should NOT DRIVE or use heavy machinery until tomorrow (because of the sedation medicines used during the test).    FOLLOW UP: Our staff will call the number listed on your records the next business day following your procedure.  We will call around 7:15- 8:00 am to check on you and address any questions or concerns that you may have regarding the information given to you following your procedure. If we do not reach you, we will leave a message.     If any biopsies were taken you will be contacted by phone or by letter within the next 1-3 weeks.  Please call us  at [phone] if you have not heard about the biopsies in 3 weeks.    SIGNATURES/CONFIDENTIALITY: You and/or your care partner have signed paperwork which will be entered into your electronic medical record.  These signatures attest to the fact that that the information above on your After Visit Summary has been reviewed and is understood.  Full responsibility of the confidentiality of this discharge information lies with you and/or your care-partner.

## 2023-12-26 ENCOUNTER — Telehealth: Payer: Self-pay

## 2023-12-26 NOTE — Telephone Encounter (Signed)
°  Follow up Call-     12/25/2023   12:41 PM  Call back number  Post procedure Call Back phone  # [phone]  Permission to leave phone message Yes     Patient questions:  Do you have a fever, pain , or abdominal swelling? No. Pain Score  0 *  Have you tolerated food without any problems? Yes.    Have you been able to return to your normal activities? Yes.    Do you have any questions about your discharge instructions: Diet   No. Medications  No. Follow up visit  No.  Do you have questions or concerns about your Care? No.  Actions: * If pain score is 4 or above: No action needed, pain <4.

## 2023-12-30 LAB — SURGICAL PATHOLOGY

## 2023-12-31 ENCOUNTER — Ambulatory Visit: Payer: Self-pay | Admitting: Gastroenterology
